# Patient Record
Sex: Female | Born: 1965 | Race: White | Hispanic: No | Marital: Single | Smoking: Current every day smoker
Health system: Southern US, Community
[De-identification: ages and names within clinical notes are randomized; demographics above are authoritative.]

## PROBLEM LIST (undated history)

## (undated) DIAGNOSIS — I10 Essential (primary) hypertension: Secondary | ICD-10-CM

## (undated) DIAGNOSIS — J449 Chronic obstructive pulmonary disease, unspecified: Secondary | ICD-10-CM

## (undated) DIAGNOSIS — F819 Developmental disorder of scholastic skills, unspecified: Secondary | ICD-10-CM

## (undated) DIAGNOSIS — I2510 Atherosclerotic heart disease of native coronary artery without angina pectoris: Secondary | ICD-10-CM

## (undated) DIAGNOSIS — G4733 Obstructive sleep apnea (adult) (pediatric): Secondary | ICD-10-CM

## (undated) DIAGNOSIS — I219 Acute myocardial infarction, unspecified: Secondary | ICD-10-CM

## (undated) DIAGNOSIS — R569 Unspecified convulsions: Secondary | ICD-10-CM

## (undated) DIAGNOSIS — M549 Dorsalgia, unspecified: Secondary | ICD-10-CM

## (undated) DIAGNOSIS — E119 Type 2 diabetes mellitus without complications: Secondary | ICD-10-CM

## (undated) DIAGNOSIS — I509 Heart failure, unspecified: Secondary | ICD-10-CM

## (undated) DIAGNOSIS — E669 Obesity, unspecified: Secondary | ICD-10-CM

## (undated) DIAGNOSIS — I69398 Other sequelae of cerebral infarction: Secondary | ICD-10-CM

## (undated) DIAGNOSIS — R42 Dizziness and giddiness: Secondary | ICD-10-CM

## (undated) DIAGNOSIS — C140 Malignant neoplasm of pharynx, unspecified: Secondary | ICD-10-CM

## (undated) DIAGNOSIS — C801 Malignant (primary) neoplasm, unspecified: Secondary | ICD-10-CM

## (undated) DIAGNOSIS — G43909 Migraine, unspecified, not intractable, without status migrainosus: Secondary | ICD-10-CM

## (undated) DIAGNOSIS — F1920 Other psychoactive substance dependence, uncomplicated: Secondary | ICD-10-CM

## (undated) DIAGNOSIS — F209 Schizophrenia, unspecified: Secondary | ICD-10-CM

## (undated) DIAGNOSIS — G4730 Sleep apnea, unspecified: Secondary | ICD-10-CM

## (undated) HISTORY — PX: APPENDECTOMY: SHX54

## (undated) HISTORY — PX: ABDOMINAL HYSTERECTOMY: SHX81

## (undated) HISTORY — PX: KNEE SURGERY: SHX244

## (undated) HISTORY — PX: EYE SURGERY: SHX253

## (undated) HISTORY — PX: CHOLECYSTECTOMY: SHX55

## (undated) HISTORY — DX: Obstructive sleep apnea (adult) (pediatric): G47.33

## (undated) HISTORY — DX: Essential (primary) hypertension: I10

## (undated) HISTORY — DX: Unspecified convulsions: R56.9

## (undated) HISTORY — DX: Type 2 diabetes mellitus without complications: E11.9

## (undated) HISTORY — PX: HYSTERECTOMY: SHX81

## (undated) HISTORY — PX: CATARACT EXTRACTION: SUR2

## (undated) HISTORY — DX: Acute myocardial infarction, unspecified: I21.9

## (undated) HISTORY — DX: Developmental disorder of scholastic skills, unspecified: F81.9

## (undated) HISTORY — DX: Heart failure, unspecified: I50.9

## (undated) HISTORY — DX: Dorsalgia, unspecified: M54.9

## (undated) HISTORY — DX: Chronic obstructive pulmonary disease, unspecified: J44.9

## (undated) HISTORY — PX: APPENDECTOMY (OPEN): SHX54

## (undated) HISTORY — DX: Atherosclerotic heart disease of native coronary artery without angina pectoris: I25.10

---

## 1998-07-05 ENCOUNTER — Emergency Department: Admit: 1998-07-05 | Disposition: A | Discharge status: Home / Self Care | Payer: Self-pay | Source: Ambulatory Visit

## 1998-07-16 ENCOUNTER — Emergency Department: Admit: 1998-07-16 | Disposition: A | Discharge status: Home / Self Care | Payer: Self-pay | Source: Ambulatory Visit

## 2012-04-28 ENCOUNTER — Encounter (HOSPITAL_COMMUNITY): Payer: Self-pay | Admitting: Emergency Medicine

## 2012-04-28 ENCOUNTER — Emergency Department (HOSPITAL_COMMUNITY)
Admission: EM | Admit: 2012-04-28 | Discharge: 2012-04-29 | Disposition: A | Discharge status: Home / Self Care | Payer: Medicaid Other | Attending: Emergency Medicine | Admitting: Emergency Medicine

## 2012-04-28 DIAGNOSIS — F419 Anxiety disorder, unspecified: Secondary | ICD-10-CM

## 2012-04-28 DIAGNOSIS — R51 Headache: Secondary | ICD-10-CM | POA: Insufficient documentation

## 2012-04-28 DIAGNOSIS — F17200 Nicotine dependence, unspecified, uncomplicated: Secondary | ICD-10-CM | POA: Insufficient documentation

## 2012-04-28 DIAGNOSIS — H532 Diplopia: Secondary | ICD-10-CM | POA: Insufficient documentation

## 2012-04-28 DIAGNOSIS — R739 Hyperglycemia, unspecified: Secondary | ICD-10-CM

## 2012-04-28 DIAGNOSIS — I10 Essential (primary) hypertension: Secondary | ICD-10-CM | POA: Insufficient documentation

## 2012-04-28 DIAGNOSIS — H538 Other visual disturbances: Secondary | ICD-10-CM | POA: Insufficient documentation

## 2012-04-28 DIAGNOSIS — E1169 Type 2 diabetes mellitus with other specified complication: Secondary | ICD-10-CM | POA: Insufficient documentation

## 2012-04-28 DIAGNOSIS — F209 Schizophrenia, unspecified: Secondary | ICD-10-CM | POA: Insufficient documentation

## 2012-04-28 DIAGNOSIS — Z794 Long term (current) use of insulin: Secondary | ICD-10-CM | POA: Insufficient documentation

## 2012-04-28 DIAGNOSIS — F411 Generalized anxiety disorder: Secondary | ICD-10-CM | POA: Insufficient documentation

## 2012-04-28 DIAGNOSIS — Z79899 Other long term (current) drug therapy: Secondary | ICD-10-CM | POA: Insufficient documentation

## 2012-04-28 HISTORY — DX: Essential (primary) hypertension: I10

## 2012-04-28 LAB — GLUCOSE, CAPILLARY: Glucose-Capillary: 173 mg/dL — ABNORMAL HIGH (ref 70–99)

## 2012-04-28 LAB — VALPROIC ACID LEVEL: Valproic Acid Lvl: <10 ug/mL — ABNORMAL LOW (ref 50.0–100.0)

## 2012-04-28 MED ORDER — DIVALPROEX SODIUM 250 MG PO DR TAB
500.0000 mg | DELAYED_RELEASE_TABLET | Freq: Once | ORAL | Status: AC
  Administered 2012-04-28: 500 mg via ORAL
  Filled 2012-04-28: qty 2

## 2012-04-28 MED ORDER — LORAZEPAM 2 MG/ML IJ SOLN
1.0000 mg | Freq: Once | INTRAMUSCULAR | Status: AC
  Administered 2012-04-28: 1 mg via INTRAMUSCULAR
  Filled 2012-04-28: qty 1

## 2012-04-28 MED ORDER — MORPHINE SULFATE 4 MG/ML IJ SOLN
2.0000 mg | Freq: Once | INTRAMUSCULAR | Status: AC
  Administered 2012-04-28: 2 mg via INTRAMUSCULAR
  Filled 2012-04-28: qty 1
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
## 2012-04-28 NOTE — ED Provider Notes (Signed)
Medical screening examination/treatment/procedure(s) were performed by non-physician practitioner and as supervising physician I was immediately available for consultation/collaboration.  Sircharles Holzheimer R. Jennife Zaucha, MD 04/28/12 2346 
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
## 2012-04-29 NOTE — ED Notes (Signed)
Taxi voucher given to pt for transport back to shelter.

## 2012-04-29 NOTE — ED Notes (Signed)
Pt from emergency shelter and assisting pt with transportation back to shelter. Charge updated re: need for transport.

## 2012-05-03 ENCOUNTER — Emergency Department (HOSPITAL_COMMUNITY): Payer: Medicaid Other

## 2012-05-03 ENCOUNTER — Encounter (HOSPITAL_COMMUNITY): Payer: Self-pay | Admitting: Emergency Medicine

## 2012-05-03 ENCOUNTER — Observation Stay (HOSPITAL_COMMUNITY)
Admission: EM | Admit: 2012-05-03 | Discharge: 2012-05-04 | Discharge status: Against Medical Advice | Payer: Medicaid Other | Attending: Emergency Medicine | Admitting: Emergency Medicine

## 2012-05-03 DIAGNOSIS — E119 Type 2 diabetes mellitus without complications: Secondary | ICD-10-CM | POA: Diagnosis present

## 2012-05-03 DIAGNOSIS — J4489 Other specified chronic obstructive pulmonary disease: Secondary | ICD-10-CM | POA: Insufficient documentation

## 2012-05-03 DIAGNOSIS — I252 Old myocardial infarction: Secondary | ICD-10-CM | POA: Insufficient documentation

## 2012-05-03 DIAGNOSIS — R079 Chest pain, unspecified: Principal | ICD-10-CM | POA: Diagnosis present

## 2012-05-03 DIAGNOSIS — J449 Chronic obstructive pulmonary disease, unspecified: Secondary | ICD-10-CM | POA: Diagnosis present

## 2012-05-03 DIAGNOSIS — R0602 Shortness of breath: Secondary | ICD-10-CM | POA: Insufficient documentation

## 2012-05-03 DIAGNOSIS — F319 Bipolar disorder, unspecified: Secondary | ICD-10-CM | POA: Diagnosis present

## 2012-05-03 DIAGNOSIS — I10 Essential (primary) hypertension: Secondary | ICD-10-CM | POA: Insufficient documentation

## 2012-05-03 HISTORY — DX: Type 2 diabetes mellitus without complications: E11.9

## 2012-05-03 HISTORY — DX: Chronic obstructive pulmonary disease, unspecified: J44.9

## 2012-05-03 HISTORY — DX: Malignant (primary) neoplasm, unspecified: C80.1

## 2012-05-03 HISTORY — DX: Unspecified convulsions: R56.9

## 2012-05-03 LAB — CBC
HCT: 38.3 % (ref 36.0–46.0)
MCHC: 33.4 g/dL (ref 30.0–36.0)
MCV: 90.8 fL (ref 78.0–100.0)
Platelets: 181 10*3/uL (ref 150–400)
RDW: 12.9 % (ref 11.5–15.5)
WBC: 8.8 10*3/uL (ref 4.0–10.5)

## 2012-05-03 LAB — POCT I-STAT, CHEM 8
Calcium, Ion: 1.2 mmol/L (ref 1.12–1.23)
Chloride: 101 mEq/L (ref 96–112)
Glucose, Bld: 191 mg/dL — ABNORMAL HIGH (ref 70–99)
HCT: 39 % (ref 36.0–46.0)
Hemoglobin: 13.3 g/dL (ref 12.0–15.0)
TCO2: 29 mmol/L (ref 0–100)

## 2012-05-03 LAB — POCT I-STAT TROPONIN I: Troponin i, poc: 0 ng/mL (ref 0.00–0.08)

## 2012-05-03 MED ORDER — ONDANSETRON HCL 4 MG/2ML IJ SOLN
4.0000 mg | Freq: Once | INTRAMUSCULAR | Status: AC
  Administered 2012-05-03: 4 mg via INTRAVENOUS
  Filled 2012-05-03: qty 2

## 2012-05-03 MED ORDER — NITROGLYCERIN 0.4 MG SL SUBL: 0.4000 mg | SUBLINGUAL_TABLET | SUBLINGUAL | Status: DC | PRN

## 2012-05-03 MED ORDER — MORPHINE SULFATE 4 MG/ML IJ SOLN
4.0000 mg | Freq: Once | INTRAMUSCULAR | Status: AC
  Administered 2012-05-03: 4 mg via INTRAVENOUS
  Filled 2012-05-03: qty 1

## 2012-05-03 NOTE — ED Notes (Signed)
Meds given PTA were Aspirin 324mg . And Nitro 0.4 SL X 1 per Pt. And 1 per EMS.

## 2012-05-03 NOTE — ED Provider Notes (Addendum)
History     CSN: [id number]  Arrival date & time 05/03/12  2256   First MD Initiated Contact with Patient 05/03/12 2311      Chief Complaint  Patient presents with  . Chest Pain    (Consider location/radiation/quality/duration/timing/severity/associated sxs/prior treatment) HPI Hx per PT. L sided CP, pressure radiates to R arm, some associated SOB, onset earlier tonight, called EMS, pain decreased from 9/10 to 4/10 with 2 NTG. Had 4 baby ASA. States h/o MI 4 years ago. No cardiologist, is from [facility]. No known aggrevating factors. H/o COPD - no wheezing. H/o crack use denies any in the last few weeks. No diaphoresis or nausea Past Medical History  Diagnosis Date  . Hypertension   . Seizures   . COPD (chronic obstructive pulmonary disease)   . Diabetes mellitus without complication   . Cancer     No past surgical history on file.  No family history on file.  History  Substance Use Topics  . Smoking status: Never Smoker   . Smokeless tobacco: Not on file  . Alcohol Use: No    OB History   Grav Para Term Preterm Abortions TAB SAB Ect Mult Living                  Review of Systems  Constitutional: Negative for fever and chills.  HENT: Negative for neck pain and neck stiffness.   Eyes: Negative for pain.  Respiratory: Negative for shortness of breath.   Cardiovascular: Positive for chest pain.  Gastrointestinal: Negative for abdominal pain.  Genitourinary: Negative for dysuria.  Musculoskeletal: Negative for back pain.  Skin: Negative for rash.  Neurological: Negative for headaches.  All other systems reviewed and are negative.    Allergies  Bee venom; Aspirin; Other; and Penicillins  Home Medications   Current Outpatient Rx  Name  Route  Sig  Dispense  Refill  . albuterol (PROVENTIL HFA;VENTOLIN HFA) 108 (90 BASE) MCG/ACT inhaler   Inhalation   Inhale 2 puffs into the lungs 2 (two) times daily as needed for wheezing or shortness of breath.          . diazepam (VALIUM) 5 MG tablet   Oral   Take 5 mg by mouth every 6 (six) hours as needed. For anxiety and depression         . divalproex (DEPAKOTE) 500 MG DR tablet   Oral   Take 500 mg by mouth 2 (two) times daily.         . enalapril (VASOTEC) 5 MG tablet   Oral   Take 5 mg by mouth daily.         . Insulin Glargine (LANTUS SOLOSTAR [place])   Subcutaneous   Inject 20 Units into the skin 2 (two) times daily.         . nitroGLYCERIN (NITROSTAT) 0.4 MG SL tablet   Sublingual   Place 0.4 mg under the tongue every 5 (five) minutes as needed for chest pain.         . Saxagliptin-Metformin (KOMBIGLYZE XR) 07-998 MG TB24   Oral   Take 1 tablet by mouth daily.         Marland Kitchen thiothixene (NAVANE) 5 MG capsule   Oral   Take 10 mg by mouth daily.         . traZODone (DESYREL) 150 MG tablet   Oral   Take 150 mg by mouth at bedtime.           BP  91/73  Pulse 81  Temp(Src) 97.7 F (36.5 C) (Oral)  Resp 19  SpO2 93%  Physical Exam  Constitutional: She is oriented to person, place, and time. She appears well-developed and well-nourished.  HENT:  Head: Normocephalic and atraumatic.  Eyes: Conjunctivae and EOM are normal. Pupils are equal, round, and reactive to light.  Neck: Trachea normal. Neck supple. No thyromegaly present.  Cardiovascular: Normal rate, regular rhythm, S1 normal, S2 normal and normal pulses.     No systolic murmur is present   No diastolic murmur is present  Pulses:      Radial pulses are 2+ on the right side, and 2+ on the left side.  Pulmonary/Chest: Effort normal and breath sounds normal. She has no wheezes. She has no rhonchi. She has no rales. She exhibits no tenderness.  Abdominal: Soft. Normal appearance and bowel sounds are normal. There is no tenderness. There is no CVA tenderness and negative Murphy's sign.  Musculoskeletal:  BLE:s Calves nontender, no cords or erythema, negative Homans sign  Neurological: She is alert and  oriented to person, place, and time. She has normal strength. No cranial nerve deficit or sensory deficit. GCS eye subscore is 4. GCS verbal subscore is 5. GCS motor subscore is 6.  Skin: Skin is warm and dry. No rash noted. She is not diaphoretic.  Psychiatric: Her speech is normal.  Cooperative and appropriate    ED Course  Procedures (including critical care time)  Results for orders placed during the hospital encounter of 05/03/12  CBC      Result Value Range   WBC 8.8  4.0 - 10.5 K/uL   RBC 4.22  3.87 - 5.11 MIL/uL   Hemoglobin 12.8  12.0 - 15.0 g/dL   HCT 16.1  09.6 - 04.5 %   MCV 90.8  78.0 - 100.0 fL   MCH 30.3  26.0 - 34.0 pg   MCHC 33.4  30.0 - 36.0 g/dL   RDW 40.9  81.1 - 91.4 %   Platelets 181  150 - 400 K/uL  POCT I-STAT, CHEM 8      Result Value Range   Sodium 138  135 - 145 mEq/L   Potassium 4.0  3.5 - 5.1 mEq/L   Chloride 101  96 - 112 mEq/L   BUN 11  6 - 23 mg/dL   Creatinine, Ser 7.82  0.50 - 1.10 mg/dL   Glucose, Bld 956 (*) 70 - 99 mg/dL   Calcium, Ion 2.13  1.12 - 1.23 mmol/L   TCO2 29  0 - 100 mmol/L   Hemoglobin 13.3  12.0 - 15.0 g/dL   HCT 08.6  57.8 - 46.9 %  POCT I-STAT TROPONIN I      Result Value Range   Troponin i, poc 0.00  0.00 - 0.08 ng/mL   Comment 3            Dg Chest Portable 1 View  05/03/2012  *RADIOLOGY REPORT*  Clinical Data: Chest pain and pressure; history of smoking and cocaine use.  PORTABLE CHEST - 1 VIEW  Comparison: Chest radiograph of 01/22/2012  Findings: The lungs are well-aerated.  Pulmonary vascularity is at the upper limits of normal.  There is no evidence of focal opacification, pleural effusion or pneumothorax.  The cardiomediastinal silhouette is borderline normal in size.  No acute osseous abnormalities are seen.  IMPRESSION: No acute cardiopulmonary process seen.   Original Report Authenticated By: Tonia Ghent, M.D.     IV morphine. NTG provided  ASA PTA   Date: 05/03/2012  Rate: 82  Rhythm: normal sinus  rhythm  QRS Axis: normal  Intervals: normal  ST/T Wave abnormalities: nonspecific ST changes  Conduction Disutrbances:none  Narrative Interpretation:   Old EKG Reviewed: none available  12:18 AM d/w unassigned MED on call, plan admit triad hospitalist DR Eben Burow   MDM  CP with h/o MI in 47 yo diabetic with COPD  CXR, ECG, labs  ASA PTA, NTG, morphine  MED admit        Sunnie Nielsen, MD 05/04/12 0020       1:10 AM PT now states she does not want to be admitted, she has things she has to do to tomorrow.   She agrees to one more troponin test and ECG.  She will leave AMA. Dr Eben Burow spoke with PT briefly and canceled admit.     Date: 05/04/2012  Rate: 63  Rhythm: normal sinus rhythm  QRS Axis: normal  Intervals: normal  ST/T Wave abnormalities: nonspecific ST changes  Conduction Disutrbances:none  Narrative Interpretation:   Old EKG Reviewed: unchanged   Repeat troponin negative. Repeat ECG unchanged  Results for orders placed during the hospital encounter of 05/03/12  CBC      Result Value Range   WBC 8.8  4.0 - 10.5 K/uL   RBC 4.22  3.87 - 5.11 MIL/uL   Hemoglobin 12.8  12.0 - 15.0 g/dL   HCT 16.1  09.6 - 04.5 %   MCV 90.8  78.0 - 100.0 fL   MCH 30.3  26.0 - 34.0 pg   MCHC 33.4  30.0 - 36.0 g/dL   RDW 40.9  81.1 - 91.4 %   Platelets 181  150 - 400 K/uL  POCT I-STAT, CHEM 8      Result Value Range   Sodium 138  135 - 145 mEq/L   Potassium 4.0  3.5 - 5.1 mEq/L   Chloride 101  96 - 112 mEq/L   BUN 11  6 - 23 mg/dL   Creatinine, Ser 7.82  0.50 - 1.10 mg/dL   Glucose, Bld 956 (*) 70 - 99 mg/dL   Calcium, Ion 2.13  1.12 - 1.23 mmol/L   TCO2 29  0 - 100 mmol/L   Hemoglobin 13.3  12.0 - 15.0 g/dL   HCT 08.6  57.8 - 46.9 %  POCT I-STAT TROPONIN I      Result Value Range   Troponin i, poc 0.00  0.00 - 0.08 ng/mL   Comment 3           POCT I-STAT TROPONIN I      Result Value Range   Troponin i, poc 0.00  0.00 - 0.08 ng/mL   Comment 3                Sunnie Nielsen, MD 05/04/12 [id number]

## 2012-05-03 NOTE — ED Notes (Signed)
EMS called out to group home tonight for Pt's C/O left sided chest pain with no radiation. Stated it started around 1400 today. Had mild MI 4 yrs. Ago was advised to have a heart cath then but she refused.

## 2012-05-03 NOTE — ED Notes (Signed)
Blood Glucose per EMS= 159

## 2012-05-04 ENCOUNTER — Encounter (HOSPITAL_COMMUNITY): Payer: Self-pay | Admitting: Vascular Surgery

## 2012-05-04 ENCOUNTER — Emergency Department (HOSPITAL_COMMUNITY)
Admission: EM | Admit: 2012-05-04 | Discharge: 2012-05-04 | Disposition: A | Discharge status: Home / Self Care | Payer: Medicaid Other | Attending: Emergency Medicine | Admitting: Emergency Medicine

## 2012-05-04 ENCOUNTER — Emergency Department (HOSPITAL_COMMUNITY): Payer: Medicaid Other

## 2012-05-04 ENCOUNTER — Encounter (HOSPITAL_COMMUNITY): Payer: Self-pay | Admitting: Emergency Medicine

## 2012-05-04 DIAGNOSIS — F209 Schizophrenia, unspecified: Secondary | ICD-10-CM | POA: Insufficient documentation

## 2012-05-04 DIAGNOSIS — M79609 Pain in unspecified limb: Secondary | ICD-10-CM | POA: Insufficient documentation

## 2012-05-04 DIAGNOSIS — G40909 Epilepsy, unspecified, not intractable, without status epilepticus: Secondary | ICD-10-CM | POA: Insufficient documentation

## 2012-05-04 DIAGNOSIS — R079 Chest pain, unspecified: Secondary | ICD-10-CM | POA: Diagnosis present

## 2012-05-04 DIAGNOSIS — G4730 Sleep apnea, unspecified: Secondary | ICD-10-CM | POA: Insufficient documentation

## 2012-05-04 DIAGNOSIS — Z79899 Other long term (current) drug therapy: Secondary | ICD-10-CM | POA: Insufficient documentation

## 2012-05-04 DIAGNOSIS — F17200 Nicotine dependence, unspecified, uncomplicated: Secondary | ICD-10-CM | POA: Insufficient documentation

## 2012-05-04 DIAGNOSIS — I10 Essential (primary) hypertension: Secondary | ICD-10-CM | POA: Insufficient documentation

## 2012-05-04 DIAGNOSIS — J4489 Other specified chronic obstructive pulmonary disease: Secondary | ICD-10-CM | POA: Insufficient documentation

## 2012-05-04 DIAGNOSIS — I2510 Atherosclerotic heart disease of native coronary artery without angina pectoris: Secondary | ICD-10-CM | POA: Insufficient documentation

## 2012-05-04 DIAGNOSIS — M79601 Pain in right arm: Secondary | ICD-10-CM

## 2012-05-04 DIAGNOSIS — E119 Type 2 diabetes mellitus without complications: Secondary | ICD-10-CM | POA: Diagnosis present

## 2012-05-04 DIAGNOSIS — Z794 Long term (current) use of insulin: Secondary | ICD-10-CM | POA: Insufficient documentation

## 2012-05-04 DIAGNOSIS — F411 Generalized anxiety disorder: Secondary | ICD-10-CM | POA: Insufficient documentation

## 2012-05-04 DIAGNOSIS — Z859 Personal history of malignant neoplasm, unspecified: Secondary | ICD-10-CM | POA: Insufficient documentation

## 2012-05-04 DIAGNOSIS — J449 Chronic obstructive pulmonary disease, unspecified: Secondary | ICD-10-CM | POA: Insufficient documentation

## 2012-05-04 DIAGNOSIS — F319 Bipolar disorder, unspecified: Secondary | ICD-10-CM | POA: Diagnosis present

## 2012-05-04 HISTORY — DX: Sleep apnea, unspecified: G47.30

## 2012-05-04 HISTORY — DX: Schizophrenia, unspecified: F20.9

## 2012-05-04 HISTORY — DX: Atherosclerotic heart disease of native coronary artery without angina pectoris: I25.10

## 2012-05-04 LAB — CBC WITH DIFFERENTIAL/PLATELET
Basophils Absolute: 0 10*3/uL (ref 0.0–0.1)
Basophils Relative: 0 % (ref 0–1)
Eosinophils Absolute: 0.1 10*3/uL (ref 0.0–0.7)
HCT: 38.4 % (ref 36.0–46.0)
MCH: 31 pg (ref 26.0–34.0)
MCHC: 33.6 g/dL (ref 30.0–36.0)
Monocytes Absolute: 0.6 10*3/uL (ref 0.1–1.0)
Monocytes Relative: 9 % (ref 3–12)
Neutro Abs: 3.5 10*3/uL (ref 1.7–7.7)
Neutrophils Relative %: 49 % (ref 43–77)
RDW: 13.2 % (ref 11.5–15.5)

## 2012-05-04 LAB — URINALYSIS, ROUTINE W REFLEX MICROSCOPIC
Bilirubin Urine: NEGATIVE
Hgb urine dipstick: NEGATIVE
Nitrite: NEGATIVE
Protein, ur: NEGATIVE mg/dL
Specific Gravity, Urine: 1.022 (ref 1.005–1.030)
Urobilinogen, UA: 0.2 mg/dL (ref 0.0–1.0)

## 2012-05-04 LAB — D-DIMER, QUANTITATIVE: D-Dimer, Quant: 0.77 ug/mL-FEU — ABNORMAL HIGH (ref 0.00–0.48)

## 2012-05-04 LAB — RAPID URINE DRUG SCREEN, HOSP PERFORMED
Amphetamines: NOT DETECTED
Barbiturates: NOT DETECTED
Benzodiazepines: POSITIVE — AB
Cocaine: NOT DETECTED
Opiates: POSITIVE — AB
Tetrahydrocannabinol: NOT DETECTED

## 2012-05-04 LAB — PROTIME-INR: INR: 0.87 (ref 0.00–1.49)

## 2012-05-04 LAB — POCT I-STAT TROPONIN I
Troponin i, poc: 0 ng/mL (ref 0.00–0.08)
Troponin i, poc: 0 ng/mL (ref 0.00–0.08)

## 2012-05-04 MED ORDER — SODIUM CHLORIDE 0.9 % IV SOLN
INTRAVENOUS | Status: DC
  Administered 2012-05-04: 17:00:00 via INTRAVENOUS

## 2012-05-04 MED ORDER — ONDANSETRON HCL 4 MG/2ML IJ SOLN
4.0000 mg | Freq: Once | INTRAMUSCULAR | Status: AC
  Administered 2012-05-04: 4 mg via INTRAVENOUS
  Filled 2012-05-04: qty 2

## 2012-05-04 MED ORDER — MORPHINE SULFATE 4 MG/ML IJ SOLN
4.0000 mg | Freq: Once | INTRAMUSCULAR | Status: AC
  Administered 2012-05-04: 4 mg via INTRAVENOUS
  Filled 2012-05-04: qty 1

## 2012-05-04 MED ORDER — LORAZEPAM 2 MG/ML IJ SOLN
1.0000 mg | Freq: Once | INTRAMUSCULAR | Status: AC
  Administered 2012-05-04: 1 mg via INTRAVENOUS
  Filled 2012-05-04: qty 1

## 2012-05-04 MED ORDER — KETOROLAC TROMETHAMINE 15 MG/ML IJ SOLN
15.0000 mg | Freq: Once | INTRAMUSCULAR | Status: DC
  Filled 2012-05-04 (×2): qty 1

## 2012-05-04 MED ORDER — KETOROLAC TROMETHAMINE 30 MG/ML IJ SOLN
INTRAMUSCULAR | Status: AC
  Filled 2012-05-04: qty 1

## 2012-05-04 NOTE — ED Notes (Addendum)
Pt seen here last night for right arm pain; pt given morphine with pain relief; pt c/o right arm pain; 80/50; pt left AMA last night;

## 2012-05-04 NOTE — ED Notes (Addendum)
CT called and informed that pt would have to have a 20 g IV. Went in to stick pt for CT angio IV requirement and pt adamantly refused to be stuck. Pt also reports that she wants another dose of pain medicine and then to go home.
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
## 2012-05-04 NOTE — ED Notes (Signed)
[facility] here for pt; 

## 2012-05-04 NOTE — ED Notes (Signed)
Carb modified diet ordered for pt 

## 2012-05-05 ENCOUNTER — Telehealth (HOSPITAL_COMMUNITY): Payer: Self-pay | Admitting: *Deleted

## 2012-05-08 ENCOUNTER — Emergency Department (HOSPITAL_COMMUNITY): Payer: Medicaid Other

## 2012-05-08 ENCOUNTER — Emergency Department (HOSPITAL_COMMUNITY)
Admission: EM | Admit: 2012-05-08 | Discharge: 2012-05-09 | Disposition: A | Discharge status: Home / Self Care | Payer: Medicaid Other | Attending: Emergency Medicine | Admitting: Emergency Medicine

## 2012-05-08 ENCOUNTER — Encounter (HOSPITAL_COMMUNITY): Payer: Self-pay | Admitting: *Deleted

## 2012-05-08 DIAGNOSIS — F209 Schizophrenia, unspecified: Secondary | ICD-10-CM | POA: Insufficient documentation

## 2012-05-08 DIAGNOSIS — R079 Chest pain, unspecified: Secondary | ICD-10-CM

## 2012-05-08 DIAGNOSIS — J449 Chronic obstructive pulmonary disease, unspecified: Secondary | ICD-10-CM | POA: Insufficient documentation

## 2012-05-08 DIAGNOSIS — Z794 Long term (current) use of insulin: Secondary | ICD-10-CM | POA: Insufficient documentation

## 2012-05-08 DIAGNOSIS — Z79899 Other long term (current) drug therapy: Secondary | ICD-10-CM | POA: Insufficient documentation

## 2012-05-08 DIAGNOSIS — Z85819 Personal history of malignant neoplasm of unspecified site of lip, oral cavity, and pharynx: Secondary | ICD-10-CM | POA: Insufficient documentation

## 2012-05-08 DIAGNOSIS — J4489 Other specified chronic obstructive pulmonary disease: Secondary | ICD-10-CM | POA: Insufficient documentation

## 2012-05-08 DIAGNOSIS — F17200 Nicotine dependence, unspecified, uncomplicated: Secondary | ICD-10-CM | POA: Insufficient documentation

## 2012-05-08 DIAGNOSIS — I2510 Atherosclerotic heart disease of native coronary artery without angina pectoris: Secondary | ICD-10-CM | POA: Insufficient documentation

## 2012-05-08 DIAGNOSIS — G40909 Epilepsy, unspecified, not intractable, without status epilepticus: Secondary | ICD-10-CM | POA: Insufficient documentation

## 2012-05-08 DIAGNOSIS — G4730 Sleep apnea, unspecified: Secondary | ICD-10-CM | POA: Insufficient documentation

## 2012-05-08 DIAGNOSIS — R071 Chest pain on breathing: Secondary | ICD-10-CM | POA: Insufficient documentation

## 2012-05-08 DIAGNOSIS — E119 Type 2 diabetes mellitus without complications: Secondary | ICD-10-CM | POA: Insufficient documentation

## 2012-05-08 DIAGNOSIS — I10 Essential (primary) hypertension: Secondary | ICD-10-CM | POA: Insufficient documentation

## 2012-05-08 LAB — CBC WITH DIFFERENTIAL/PLATELET
Basophils Absolute: 0 10*3/uL (ref 0.0–0.1)
Basophils Relative: 0 % (ref 0–1)
Eosinophils Relative: 1 % (ref 0–5)
HCT: 39.9 % (ref 36.0–46.0)
Lymphocytes Relative: 33 % (ref 12–46)
MCHC: 34.3 g/dL (ref 30.0–36.0)
Monocytes Absolute: 1.1 10*3/uL — ABNORMAL HIGH (ref 0.1–1.0)
Neutro Abs: 7.2 10*3/uL (ref 1.7–7.7)
Platelets: 187 10*3/uL (ref 150–400)
RDW: 12.7 % (ref 11.5–15.5)
WBC: 12.7 10*3/uL — ABNORMAL HIGH (ref 4.0–10.5)

## 2012-05-08 LAB — BASIC METABOLIC PANEL
Calcium: 9.8 mg/dL (ref 8.4–10.5)
Chloride: 98 mEq/L (ref 96–112)
Creatinine, Ser: 0.59 mg/dL (ref 0.50–1.10)
GFR calc Af Amer: >90 mL/min (ref >90)
Sodium: 135 mEq/L (ref 135–145)

## 2012-05-08 LAB — TROPONIN I: Troponin I: <0.3 ng/mL (ref <0.30)

## 2012-05-08 LAB — POCT PREGNANCY, URINE: Preg Test, Ur: NEGATIVE

## 2012-05-08 MED ORDER — ONDANSETRON HCL 4 MG/2ML IJ SOLN
4.0000 mg | Freq: Once | INTRAMUSCULAR | Status: AC
  Administered 2012-05-08: 4 mg via INTRAVENOUS
  Filled 2012-05-08: qty 2

## 2012-05-08 MED ORDER — HYDROMORPHONE HCL PF 1 MG/ML IJ SOLN
1.0000 mg | Freq: Once | INTRAMUSCULAR | Status: AC
  Administered 2012-05-08: 1 mg via INTRAVENOUS
  Filled 2012-05-08: qty 1

## 2012-05-08 MED ORDER — IOHEXOL 350 MG/ML SOLN
100.0000 mL | Freq: Once | INTRAVENOUS | Status: AC | PRN
  Administered 2012-05-08: 100 mL via INTRAVENOUS

## 2012-05-08 NOTE — ED Notes (Addendum)
Patient transported to CT 

## 2012-05-08 NOTE — ED Notes (Signed)
Pt seen here for same four days ago. Pt refused the CT angio on prior visit

## 2012-05-08 NOTE — ED Notes (Signed)
CT informed pt is ready for transport. 

## 2012-05-08 NOTE — ED Notes (Signed)
Per EMS: pt coming from homeless shelter with c/o mid chest pain, worse on palpation, pt reports nausea, and diaphoresis, denies shortness of breath. Pt given 81 mg asa x4, nitro x1 with no relief. 12 lead was NS with no changes. Pt is A&Ox4, skin warm and dry, respirations equal and unlabored.

## 2012-05-09 LAB — POCT I-STAT, CHEM 8
BUN: 15 mg/dL (ref 6–23)
Chloride: 103 mEq/L (ref 96–112)
Creatinine, Ser: 0.9 mg/dL (ref 0.50–1.10)
Glucose, Bld: 204 mg/dL — ABNORMAL HIGH (ref 70–99)
Hemoglobin: 12.9 g/dL (ref 12.0–15.0)
Potassium: 6.8 mEq/L (ref 3.5–5.1)
Sodium: 135 mEq/L (ref 135–145)

## 2012-05-09 MED ORDER — FENTANYL CITRATE 0.05 MG/ML IJ SOLN
50.0000 ug | Freq: Once | INTRAMUSCULAR | Status: AC
  Administered 2012-05-09: 50 ug via INTRAVENOUS
  Filled 2012-05-09: qty 2

## 2012-05-09 MED ORDER — HYDROMORPHONE HCL PF 1 MG/ML IJ SOLN
1.0000 mg | Freq: Once | INTRAMUSCULAR | Status: DC
  Filled 2012-05-09: qty 1

## 2012-05-09 NOTE — ED Provider Notes (Signed)
History     CSN: [id number]  Arrival date & time 05/08/12  1928   First MD Initiated Contact with Patient 05/08/12 2041      Chief Complaint  Patient presents with  . Chest Pain    HPI Carol Hughes is a 47 y.o. female who presents to the ED for concern of R sided CP and blood clot.  Was seen here 5 days ago with these same symptoms and had positive d-dimer but left AMA.  Now presents as over last 5 hours patient with same pain.  R sided, sharp, pleuritic, moderate in severity, worse with breathing, coughing, laughing, better with nothing.  No fevers.  No leg swelling.  No other symptoms.  Past Medical History  Diagnosis Date  . Hypertension   . Seizures   . COPD (chronic obstructive pulmonary disease)   . Diabetes mellitus without complication   . Cancer throat  . Sleep apnea   . Coronary artery disease   . Schizophrenia     Past Surgical History  Procedure Laterality Date  . Abdominal hysterectomy    . Appendectomy    . Cholecystectomy    . Eye surgery    . Cesarean section    . Knee surgery      History reviewed. No pertinent family history.  History  Substance Use Topics  . Smoking status: Current Every Day Smoker -- 0.50 packs/day  . Smokeless tobacco: Not on file  . Alcohol Use: No    OB History   Grav Para Term Preterm Abortions TAB SAB Ect Mult Living                  Review of Systems  Constitutional: Negative for fever and chills.  HENT: Negative for congestion, rhinorrhea, neck pain and neck stiffness.   Respiratory: Negative for cough and shortness of breath.   Cardiovascular: Positive for chest pain.  Gastrointestinal: Negative for nausea, vomiting, abdominal pain, diarrhea and abdominal distention.  Endocrine: Negative for polyuria.  Genitourinary: Negative for dysuria.  Skin: Negative for rash.  Neurological: Negative for headaches.  Psychiatric/Behavioral: Negative.   All other systems reviewed and are negative.    Allergies  Bee  venom; Aspirin; Other; and Penicillins  Home Medications   Current Outpatient Rx  Name  Route  Sig  Dispense  Refill  . divalproex (DEPAKOTE) 500 MG DR tablet   Oral   Take 500 mg by mouth 2 (two) times daily.         . enalapril (VASOTEC) 5 MG tablet   Oral   Take 5 mg by mouth daily.         . Insulin Glargine (LANTUS SOLOSTAR [place])   Subcutaneous   Inject 20 Units into the skin 2 (two) times daily.         . Saxagliptin-Metformin (KOMBIGLYZE XR) 07-998 MG TB24   Oral   Take 1 tablet by mouth daily.         Marland Kitchen thiothixene (NAVANE) 5 MG capsule   Oral   Take 10 mg by mouth daily.         . traZODone (DESYREL) 150 MG tablet   Oral   Take 150 mg by mouth at bedtime.         . nitroGLYCERIN (NITROSTAT) 0.4 MG SL tablet   Sublingual   Place 0.4 mg under the tongue every 5 (five) minutes as needed for chest pain.           BP 100/55  Pulse 71  Temp(Src) 98.1 F (36.7 C) (Oral)  Resp 16  SpO2 97%  Physical Exam  Nursing note and vitals reviewed. Constitutional: She is oriented to person, place, and time. She appears well-developed and well-nourished. No distress.  HENT:  Head: Normocephalic and atraumatic.  Right Ear: External ear normal.  Left Ear: External ear normal.  Nose: Nose normal.  Mouth/Throat: Oropharynx is clear and moist. No oropharyngeal exudate.  Eyes: EOM are normal. Pupils are equal, round, and reactive to light.  Neck: Normal range of motion. Neck supple. No tracheal deviation present.  Cardiovascular: Normal rate.   Pulmonary/Chest: Effort normal and breath sounds normal. No stridor. No respiratory distress. She has no wheezes. She has no rales.  Abdominal: Soft. She exhibits no distension. There is no tenderness. There is no rebound.  Musculoskeletal: Normal range of motion.  Neurological: She is alert and oriented to person, place, and time.  Skin: Skin is warm and dry. She is not diaphoretic.    ED Course  Procedures  (including critical care time) Procedure: EJ IV access Resident: Arloa Koh, MD Attending: Dr. Oletta Lamas Note: Verbal consent obtained.  Patient laid in trendelenburg position.  R EJ with best visibility.  Prepped with alcohol pad.  Patients head turned to L.  Needle inserted with flashback on first try.  Catheter advanced in to vein.  Catheter secured.  Attending present for entire procedure.  Patient tolerated procedure well without complications.    Labs Reviewed  CBC WITH DIFFERENTIAL - Abnormal; Notable for the following:    WBC 12.7 (*)    Lymphs Abs 4.2 (*)    Monocytes Absolute 1.1 (*)    All other components within normal limits  BASIC METABOLIC PANEL - Abnormal; Notable for the following:    Glucose, Bld 216 (*)    All other components within normal limits  TROPONIN I  POCT PREGNANCY, URINE   Ct Angio Chest Pe W/cm &/or Wo Cm  05/09/2012  *RADIOLOGY REPORT*  Clinical Data: Chest pain, history of throat cancer evaluate for PE  CT ANGIOGRAPHY CHEST  Technique:  Multidetector CT imaging of the chest using the standard protocol during bolus administration of intravenous contrast. Multiplanar reconstructed images including MIPs were obtained and reviewed to evaluate the vascular anatomy.  Contrast: OMNIPAQUE IOHEXOL 350 MG/ML SOLN  Comparison: Most recent prior chest x-ray 05/03/2012; prior CT chest 03/20/2008  Findings:  Mediastinum: No suspicious mediastinal or hilar adenopathy. Unremarkable thoracic esophagus.  Heart/Vascular: Adequate opacification of the pulmonary artery to the segmental level.  Mildly limited evaluation of the small vessels secondary to respiratory motion.  No evidence of central filling defect to the level of the segmental arteries to suggest acute pulmonary embolus.  Heart is top normal for size with prominence of the left ventricle.  No pericardial effusion. Limited evaluation for coronary artery disease secondary to respiratory motion.  Trace atherosclerotic  calcifications noted in the right brachiocephalic artery and descending thoracic aorta.  Lungs/Pleura: Scattered areas of air trapping bilaterally throughout the lungs.  Dependent atelectasis in the lower lobes single pulmonary cyst versus bulla in the left lower lobe.  Upper Abdomen: The imaged upper abdomen is unremarkable.  Bones: No acute fracture or aggressive appearing lytic or blastic osseous lesion.  IMPRESSION:  1.  Negative for acute pulmonary embolus, pneumonia or other acute cardiopulmonary process. 2.  Scattered areas of air trapping bilaterally suggests underlying small vessels and / or small airways disease. 3.  Borderline cardiomegaly with prominence of the left  ventricle. 4.  Trace atherosclerotic vascular disease   Original Report Authenticated By: Malachy Moan, M.D.      1. Chest pain      Date: 05/09/2012  Rate: 89  Rhythm: normal sinus rhythm  QRS Axis: normal  Intervals: normal  ST/T Wave abnormalities: normal  Conduction Disutrbances:none  Narrative Interpretation: Normal EKG  Old EKG Reviewed: unchanged    MDM   Anniyah Lennartz is a 47 y.o. female who presents to the ED for concern of R sided pleuritic CP.  Patient reports that this is nothing like her anginal equivalent.  Concern for PE earlier this week.  Pain is pleuritic.  CT PE study done and negative.  Patient feeling better.  Likely MSK as reproducible on exam.  Patient safe for discharge.  Patient discharged.     Arloa Koh, MD 05/09/12 0201

## 2012-05-09 NOTE — ED Notes (Signed)
Pt alert, NAD, calm, interactive. Admits to dizziness (when asked). Denies blurred vision. BP cuff checked. Properly fitting large cuff in place on R upper arm. BP confirmed manually.

## 2012-05-09 NOTE — ED Notes (Signed)
EDP resident at [facility].

## 2012-05-09 NOTE — ED Provider Notes (Signed)
I saw and evaluated the patient, reviewed the resident's note and I agree with the findings and plan.  I saw and agree with ECG interpretation by resident.  Pt with atypical CP, sharp, pleuritic component.  Risk factors include obesity.  Pt was to have CT chest due to + ddimer 4 days ago, pt left AMA.  Due to continued symptoms, presented for further eval.  Will get angio chest.  ECG shows no new ischemia,low clinical concern for ACS.  Troponin is neg.   If neg CT, will d/c home.  Gavin Pound. Oletta Lamas, MD 05/09/12 1610

## 2012-07-01 ENCOUNTER — Encounter (HOSPITAL_COMMUNITY): Payer: Self-pay | Admitting: *Deleted

## 2012-07-01 ENCOUNTER — Emergency Department (HOSPITAL_COMMUNITY)
Admission: EM | Admit: 2012-07-01 | Discharge: 2012-07-01 | Discharge status: Against Medical Advice | Payer: Medicaid Other | Attending: Emergency Medicine | Admitting: Emergency Medicine

## 2012-07-01 DIAGNOSIS — F17200 Nicotine dependence, unspecified, uncomplicated: Secondary | ICD-10-CM | POA: Insufficient documentation

## 2012-07-01 DIAGNOSIS — R21 Rash and other nonspecific skin eruption: Secondary | ICD-10-CM | POA: Insufficient documentation

## 2012-07-01 DIAGNOSIS — F411 Generalized anxiety disorder: Secondary | ICD-10-CM | POA: Insufficient documentation

## 2012-07-01 DIAGNOSIS — Z760 Encounter for issue of repeat prescription: Secondary | ICD-10-CM | POA: Insufficient documentation

## 2012-07-01 DIAGNOSIS — R51 Headache: Secondary | ICD-10-CM | POA: Insufficient documentation

## 2012-07-01 DIAGNOSIS — L989 Disorder of the skin and subcutaneous tissue, unspecified: Secondary | ICD-10-CM | POA: Insufficient documentation

## 2012-07-01 LAB — CBC WITH DIFFERENTIAL/PLATELET
Basophils Absolute: 0 10*3/uL (ref 0.0–0.1)
Basophils Relative: 0 % (ref 0–1)
Eosinophils Absolute: 0.1 10*3/uL (ref 0.0–0.7)
Eosinophils Relative: 1 % (ref 0–5)
MCH: 31.4 pg (ref 26.0–34.0)
MCHC: 35 g/dL (ref 30.0–36.0)
MCV: 89.7 fL (ref 78.0–100.0)
Platelets: 189 10*3/uL (ref 150–400)
RDW: 13.1 % (ref 11.5–15.5)

## 2012-07-01 LAB — COMPREHENSIVE METABOLIC PANEL
ALT: 22 U/L (ref 0–35)
AST: 31 U/L (ref 0–37)
Albumin: 3.8 g/dL (ref 3.5–5.2)
Calcium: 9.7 mg/dL (ref 8.4–10.5)
GFR calc Af Amer: >90 mL/min (ref >90)
Glucose, Bld: 160 mg/dL — ABNORMAL HIGH (ref 70–99)
Sodium: 138 mEq/L (ref 135–145)
Total Protein: 6.8 g/dL (ref 6.0–8.3)

## 2012-07-01 LAB — URINALYSIS, ROUTINE W REFLEX MICROSCOPIC
Bilirubin Urine: NEGATIVE
Hgb urine dipstick: NEGATIVE
Nitrite: NEGATIVE
Protein, ur: NEGATIVE mg/dL
Urobilinogen, UA: 0.2 mg/dL (ref 0.0–1.0)
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
## 2012-07-01 NOTE — ED Notes (Signed)
No answer x2 

## 2012-07-01 NOTE — ED Notes (Signed)
Called PT for A2 and no answer

## 2012-07-01 NOTE — ED Notes (Signed)
No answer x3. Unable to find. Not in w/r, triage, h/w, b/r, entry way or outside entrance.

## 2012-07-01 NOTE — ED Notes (Signed)
CBG checked 156

## 2012-07-04 ENCOUNTER — Encounter (HOSPITAL_COMMUNITY): Payer: Self-pay | Admitting: *Deleted

## 2012-07-04 ENCOUNTER — Inpatient Hospital Stay (HOSPITAL_COMMUNITY)
Admission: EM | Admit: 2012-07-04 | Discharge: 2012-07-07 | DRG: 601 | Disposition: A | Discharge status: Home / Self Care | Payer: Medicaid Other | Attending: Internal Medicine | Admitting: Internal Medicine

## 2012-07-04 DIAGNOSIS — Z9089 Acquired absence of other organs: Secondary | ICD-10-CM

## 2012-07-04 DIAGNOSIS — N61 Mastitis without abscess: Principal | ICD-10-CM | POA: Diagnosis present

## 2012-07-04 DIAGNOSIS — Z91038 Other insect allergy status: Secondary | ICD-10-CM

## 2012-07-04 DIAGNOSIS — IMO0002 Reserved for concepts with insufficient information to code with codable children: Secondary | ICD-10-CM

## 2012-07-04 DIAGNOSIS — S21009A Unspecified open wound of unspecified breast, initial encounter: Secondary | ICD-10-CM | POA: Diagnosis present

## 2012-07-04 DIAGNOSIS — Z886 Allergy status to analgesic agent status: Secondary | ICD-10-CM

## 2012-07-04 DIAGNOSIS — Z880 Allergy status to penicillin: Secondary | ICD-10-CM

## 2012-07-04 DIAGNOSIS — Z90710 Acquired absence of both cervix and uterus: Secondary | ICD-10-CM

## 2012-07-04 DIAGNOSIS — W269XXA Contact with unspecified sharp object(s), initial encounter: Secondary | ICD-10-CM | POA: Diagnosis present

## 2012-07-04 DIAGNOSIS — F17200 Nicotine dependence, unspecified, uncomplicated: Secondary | ICD-10-CM | POA: Diagnosis present

## 2012-07-04 DIAGNOSIS — F319 Bipolar disorder, unspecified: Secondary | ICD-10-CM | POA: Diagnosis present

## 2012-07-04 DIAGNOSIS — I2510 Atherosclerotic heart disease of native coronary artery without angina pectoris: Secondary | ICD-10-CM | POA: Diagnosis present

## 2012-07-04 DIAGNOSIS — E119 Type 2 diabetes mellitus without complications: Secondary | ICD-10-CM | POA: Diagnosis present

## 2012-07-04 DIAGNOSIS — E860 Dehydration: Secondary | ICD-10-CM | POA: Diagnosis present

## 2012-07-04 DIAGNOSIS — R569 Unspecified convulsions: Secondary | ICD-10-CM

## 2012-07-04 DIAGNOSIS — F209 Schizophrenia, unspecified: Secondary | ICD-10-CM | POA: Diagnosis present

## 2012-07-04 DIAGNOSIS — S298XXA Other specified injuries of thorax, initial encounter: Secondary | ICD-10-CM | POA: Diagnosis present

## 2012-07-04 DIAGNOSIS — G40909 Epilepsy, unspecified, not intractable, without status epilepticus: Secondary | ICD-10-CM | POA: Diagnosis present

## 2012-07-04 DIAGNOSIS — J4489 Other specified chronic obstructive pulmonary disease: Secondary | ICD-10-CM | POA: Diagnosis present

## 2012-07-04 DIAGNOSIS — Z859 Personal history of malignant neoplasm, unspecified: Secondary | ICD-10-CM

## 2012-07-04 DIAGNOSIS — I10 Essential (primary) hypertension: Secondary | ICD-10-CM | POA: Diagnosis present

## 2012-07-04 DIAGNOSIS — R079 Chest pain, unspecified: Secondary | ICD-10-CM

## 2012-07-04 DIAGNOSIS — J449 Chronic obstructive pulmonary disease, unspecified: Secondary | ICD-10-CM

## 2012-07-04 LAB — COMPREHENSIVE METABOLIC PANEL
ALT: 15 U/L (ref 0–35)
AST: 27 U/L (ref 0–37)
Albumin: 3.7 g/dL (ref 3.5–5.2)
CO2: 25 mEq/L (ref 19–32)
Calcium: 9.7 mg/dL (ref 8.4–10.5)
Creatinine, Ser: 0.71 mg/dL (ref 0.50–1.10)
Total Protein: 6.9 g/dL (ref 6.0–8.3)

## 2012-07-04 LAB — CBC WITH DIFFERENTIAL/PLATELET
Eosinophils Absolute: 0.1 10*3/uL (ref 0.0–0.7)
Hemoglobin: 13.3 g/dL (ref 12.0–15.0)
Lymphs Abs: 3.2 10*3/uL (ref 0.7–4.0)
MCH: 30.7 pg (ref 26.0–34.0)
Monocytes Relative: 7 % (ref 3–12)
Neutro Abs: 4.8 10*3/uL (ref 1.7–7.7)
Neutrophils Relative %: 55 % (ref 43–77)
RBC: 4.33 MIL/uL (ref 3.87–5.11)

## 2012-07-04 MED ORDER — SODIUM CHLORIDE 0.9 % IV SOLN
Freq: Once | INTRAVENOUS | Status: AC
  Administered 2012-07-04: 23:00:00 via INTRAVENOUS

## 2012-07-04 MED ORDER — VALPROATE SODIUM 500 MG/5ML IV SOLN
500.0000 mg | Freq: Once | INTRAVENOUS | Status: AC
  Administered 2012-07-04: 500 mg via INTRAVENOUS
  Filled 2012-07-04: qty 5

## 2012-07-04 MED ORDER — MORPHINE SULFATE 4 MG/ML IJ SOLN
4.0000 mg | Freq: Once | INTRAMUSCULAR | Status: AC
  Administered 2012-07-04: 4 mg via INTRAVENOUS
  Filled 2012-07-04: qty 1

## 2012-07-04 MED ORDER — LORAZEPAM 2 MG/ML IJ SOLN
1.0000 mg | Freq: Once | INTRAMUSCULAR | Status: AC
  Administered 2012-07-04: 1 mg via INTRAVENOUS
  Filled 2012-07-04: qty 1

## 2012-07-04 MED ORDER — TETANUS-DIPHTH-ACELL PERTUSSIS 5-2.5-18.5 LF-MCG/0.5 IM SUSP
0.5000 mL | Freq: Once | INTRAMUSCULAR | Status: AC
  Administered 2012-07-05: 0.5 mL via INTRAMUSCULAR
  Filled 2012-07-04: qty 0.5

## 2012-07-04 MED ORDER — ONDANSETRON HCL 4 MG/2ML IJ SOLN
4.0000 mg | Freq: Once | INTRAMUSCULAR | Status: AC
  Administered 2012-07-04: 4 mg via INTRAVENOUS
  Filled 2012-07-04: qty 2
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
## 2012-07-04 NOTE — ED Notes (Signed)
Patient with seizure in waiting room.  Patient moved to Room 14 at this time.
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
## 2012-07-05 ENCOUNTER — Encounter (HOSPITAL_COMMUNITY): Payer: Self-pay | Admitting: Internal Medicine

## 2012-07-05 ENCOUNTER — Emergency Department (HOSPITAL_COMMUNITY): Payer: Medicaid Other

## 2012-07-05 DIAGNOSIS — E119 Type 2 diabetes mellitus without complications: Secondary | ICD-10-CM

## 2012-07-05 DIAGNOSIS — N61 Mastitis without abscess: Secondary | ICD-10-CM

## 2012-07-05 DIAGNOSIS — G40909 Epilepsy, unspecified, not intractable, without status epilepticus: Secondary | ICD-10-CM | POA: Diagnosis present

## 2012-07-05 DIAGNOSIS — R569 Unspecified convulsions: Secondary | ICD-10-CM

## 2012-07-05 DIAGNOSIS — F319 Bipolar disorder, unspecified: Secondary | ICD-10-CM

## 2012-07-05 DIAGNOSIS — E860 Dehydration: Secondary | ICD-10-CM | POA: Diagnosis present

## 2012-07-05 LAB — CBC
HCT: 36.5 % (ref 36.0–46.0)
Hemoglobin: 12.6 g/dL (ref 12.0–15.0)
MCH: 30.7 pg (ref 26.0–34.0)
MCV: 89 fL (ref 78.0–100.0)
RBC: 4.1 MIL/uL (ref 3.87–5.11)
WBC: 8.3 10*3/uL (ref 4.0–10.5)

## 2012-07-05 LAB — GLUCOSE, CAPILLARY
Glucose-Capillary: 154 mg/dL — ABNORMAL HIGH (ref 70–99)
Glucose-Capillary: 149 mg/dL — ABNORMAL HIGH (ref 70–99)
Glucose-Capillary: 87 mg/dL (ref 70–99)

## 2012-07-05 LAB — LACTIC ACID, PLASMA: Lactic Acid, Venous: 1.4 mmol/L (ref 0.5–2.2)

## 2012-07-05 LAB — CREATININE, SERUM: GFR calc Af Amer: >90 mL/min (ref >90)

## 2012-07-05 MED ORDER — METFORMIN HCL ER 500 MG PO TB24
1000.0000 mg | ORAL_TABLET | Freq: Every day | ORAL | Status: DC
  Administered 2012-07-05: 1000 mg via ORAL
  Filled 2012-07-05 (×3): qty 2

## 2012-07-05 MED ORDER — VANCOMYCIN HCL 10 G IV SOLR
2000.0000 mg | Freq: Once | INTRAVENOUS | Status: DC
  Administered 2012-07-05: 2000 mg via INTRAVENOUS
  Filled 2012-07-05 (×2): qty 2000

## 2012-07-05 MED ORDER — INSULIN ASPART 100 UNIT/ML ~~LOC~~ SOLN: 0.0000 [IU] | Freq: Every day | SUBCUTANEOUS | Status: DC

## 2012-07-05 MED ORDER — OXYCODONE HCL 5 MG PO TABS
10.0000 mg | ORAL_TABLET | ORAL | Status: DC | PRN
  Administered 2012-07-05 – 2012-07-06 (×4): 10 mg via ORAL
  Filled 2012-07-05 (×4): qty 2

## 2012-07-05 MED ORDER — DIVALPROEX SODIUM 500 MG PO DR TAB
500.0000 mg | DELAYED_RELEASE_TABLET | Freq: Two times a day (BID) | ORAL | Status: DC
  Administered 2012-07-05 – 2012-07-07 (×6): 500 mg via ORAL
  Filled 2012-07-05 (×9): qty 1

## 2012-07-05 MED ORDER — SODIUM CHLORIDE 0.9 % IV SOLN
INTRAVENOUS | Status: AC
  Administered 2012-07-05: 01:00:00 via INTRAVENOUS

## 2012-07-05 MED ORDER — SODIUM CHLORIDE 0.9 % IV SOLN: Freq: Once | INTRAVENOUS | Status: DC

## 2012-07-05 MED ORDER — CLINDAMYCIN PHOSPHATE 900 MG/50ML IV SOLN
900.0000 mg | Freq: Three times a day (TID) | INTRAVENOUS | Status: DC
  Administered 2012-07-05 – 2012-07-06 (×4): 900 mg via INTRAVENOUS
  Filled 2012-07-05 (×7): qty 50

## 2012-07-05 MED ORDER — INSULIN ASPART 100 UNIT/ML ~~LOC~~ SOLN
0.0000 [IU] | Freq: Three times a day (TID) | SUBCUTANEOUS | Status: DC
  Administered 2012-07-05 (×2): 2 [IU] via SUBCUTANEOUS

## 2012-07-05 MED ORDER — ONDANSETRON HCL 4 MG/2ML IJ SOLN: 4.0000 mg | Freq: Four times a day (QID) | INTRAMUSCULAR | Status: DC | PRN

## 2012-07-05 MED ORDER — CLINDAMYCIN PHOSPHATE 900 MG/50ML IV SOLN: 900.0000 mg | Freq: Once | INTRAVENOUS | Status: DC

## 2012-07-05 MED ORDER — SODIUM CHLORIDE 0.9 % IV SOLN
1000.0000 mg | Freq: Once | INTRAVENOUS | Status: AC
  Administered 2012-07-05: 1000 mg via INTRAVENOUS
  Filled 2012-07-05: qty 10

## 2012-07-05 MED ORDER — HYDROMORPHONE HCL PF 1 MG/ML IJ SOLN
1.0000 mg | INTRAMUSCULAR | Status: DC | PRN
  Administered 2012-07-05: 1 mg via INTRAVENOUS
  Filled 2012-07-05 (×2): qty 1

## 2012-07-05 MED ORDER — INSULIN GLARGINE 100 UNIT/ML ~~LOC~~ SOLN
20.0000 [IU] | Freq: Two times a day (BID) | SUBCUTANEOUS | Status: DC
  Administered 2012-07-05 – 2012-07-07 (×6): 20 [IU] via SUBCUTANEOUS
  Filled 2012-07-05 (×7): qty 0.2

## 2012-07-05 MED ORDER — ONDANSETRON HCL 4 MG PO TABS: 4.0000 mg | ORAL_TABLET | Freq: Four times a day (QID) | ORAL | Status: DC | PRN

## 2012-07-05 MED ORDER — SODIUM CHLORIDE 0.9 % IJ SOLN
3.0000 mL | Freq: Two times a day (BID) | INTRAMUSCULAR | Status: DC
  Administered 2012-07-05 – 2012-07-07 (×5): 3 mL via INTRAVENOUS

## 2012-07-05 MED ORDER — ONDANSETRON HCL 4 MG/2ML IJ SOLN: 4.0000 mg | Freq: Three times a day (TID) | INTRAMUSCULAR | Status: DC | PRN

## 2012-07-05 MED ORDER — VANCOMYCIN HCL IN DEXTROSE 1-5 GM/200ML-% IV SOLN
1000.0000 mg | Freq: Two times a day (BID) | INTRAVENOUS | Status: DC
  Filled 2012-07-05: qty 200

## 2012-07-05 MED ORDER — THIOTHIXENE 10 MG PO CAPS
10.0000 mg | ORAL_CAPSULE | Freq: Every day | ORAL | Status: DC
  Administered 2012-07-05 – 2012-07-07 (×3): 10 mg via ORAL
  Filled 2012-07-05 (×3): qty 1

## 2012-07-05 MED ORDER — SULFAMETHOXAZOLE-TRIMETHOPRIM 400-80 MG/5ML IV SOLN
320.0000 mg | Freq: Three times a day (TID) | INTRAVENOUS | Status: DC
  Administered 2012-07-05 – 2012-07-06 (×4): 320 mg via INTRAVENOUS
  Filled 2012-07-05 (×10): qty 20

## 2012-07-05 MED ORDER — LINAGLIPTIN 5 MG PO TABS
5.0000 mg | ORAL_TABLET | Freq: Every day | ORAL | Status: DC
  Administered 2012-07-05 – 2012-07-07 (×3): 5 mg via ORAL
  Filled 2012-07-05 (×5): qty 1

## 2012-07-05 MED ORDER — SAXAGLIPTIN-METFORMIN ER 5-1000 MG PO TB24: 1.0000 | ORAL_TABLET | Freq: Every day | ORAL | Status: DC

## 2012-07-05 MED ORDER — DIPHENHYDRAMINE HCL 50 MG/ML IJ SOLN
12.5000 mg | Freq: Four times a day (QID) | INTRAMUSCULAR | Status: DC | PRN
  Administered 2012-07-05: 12.5 mg via INTRAVENOUS
  Filled 2012-07-05: qty 1

## 2012-07-05 MED ORDER — SODIUM CHLORIDE 0.9 % IV SOLN
Freq: Once | INTRAVENOUS | Status: AC
  Administered 2012-07-05: 04:00:00 via INTRAVENOUS

## 2012-07-05 MED ORDER — TRAZODONE HCL 150 MG PO TABS
150.0000 mg | ORAL_TABLET | Freq: Every day | ORAL | Status: DC
  Administered 2012-07-05 – 2012-07-06 (×2): 150 mg via ORAL
  Filled 2012-07-05 (×4): qty 1

## 2012-07-05 MED ORDER — HYDROMORPHONE HCL PF 1 MG/ML IJ SOLN: 1.0000 mg | INTRAMUSCULAR | Status: DC | PRN

## 2012-07-05 MED ORDER — ENOXAPARIN SODIUM 40 MG/0.4ML ~~LOC~~ SOLN
40.0000 mg | SUBCUTANEOUS | Status: DC
  Administered 2012-07-05 – 2012-07-06 (×2): 40 mg via SUBCUTANEOUS
  Filled 2012-07-05 (×4): qty 0.4
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
## 2012-07-05 NOTE — ED Provider Notes (Signed)
Medical screening examination/treatment/procedure(s) were performed by non-physician practitioner and as supervising physician I was immediately available for consultation/collaboration.  Jasmine Awe, MD 07/05/12 [phone]

## 2012-07-05 NOTE — Progress Notes (Signed)
Pt began intensely itching almost immediately after vancomycin infusion began, now to change to Septra.  Will begin Septra 320mg  IV Q8H and monitor for change to PO ABX.  Vernard Gambles, PharmD, BCPS 07/05/2012 4:21 AM

## 2012-07-05 NOTE — Progress Notes (Signed)
Utilization Review Completed Angeliz Settlemyre J. Tayia Stonesifer, RN, BSN, NCM [phone]  

## 2012-07-05 NOTE — Progress Notes (Signed)
The patient started itching intensely after giving the vancomycin.  The vancomycin was stopped and the NP on call was consulted.  New orders were given for Benadryl and the vancomycin was discontinued.  Clindamycin was discussed and the decision was made for it to remain in the medication regimen.  The RN will carry out the orders and continue to monitor the patient.
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
## 2012-07-05 NOTE — Progress Notes (Signed)
The patient arrived to 61.  The patient was placed on telemetry and oriented to the unit.  The patient was assessed and VS were taken.  The patient displayed child-like behavior with complaints of pain of 9 out of 10.  Dilaudid was given.  The call bell was placed within reach and the family is at the bedside.

## 2012-07-05 NOTE — ED Notes (Signed)
Pt transported to 4700 via stretcher on a monitor by Foye Clock NT, report given to 4700 RN
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
## 2012-07-06 LAB — CBC
Hemoglobin: 11.8 g/dL — ABNORMAL LOW (ref 12.0–15.0)
MCH: 30.2 pg (ref 26.0–34.0)
MCHC: 33.8 g/dL (ref 30.0–36.0)
RDW: 13.4 % (ref 11.5–15.5)

## 2012-07-06 LAB — GLUCOSE, CAPILLARY
Glucose-Capillary: 92 mg/dL (ref 70–99)
Glucose-Capillary: 97 mg/dL (ref 70–99)
Glucose-Capillary: 97 mg/dL (ref 70–99)
Glucose-Capillary: 115 mg/dL — ABNORMAL HIGH (ref 70–99)

## 2012-07-06 LAB — BASIC METABOLIC PANEL
BUN: 9 mg/dL (ref 6–23)
Calcium: 9.2 mg/dL (ref 8.4–10.5)
Creatinine, Ser: 0.96 mg/dL (ref 0.50–1.10)
GFR calc non Af Amer: 69 mL/min — ABNORMAL LOW (ref >90)
Glucose, Bld: 78 mg/dL (ref 70–99)
Potassium: 4.4 mEq/L (ref 3.5–5.1)

## 2012-07-06 MED ORDER — SULFAMETHOXAZOLE-TMP DS 800-160 MG PO TABS
2.0000 | ORAL_TABLET | Freq: Two times a day (BID) | ORAL | Status: DC
  Administered 2012-07-06 – 2012-07-07 (×2): 2 via ORAL
  Filled 2012-07-06 (×6): qty 2

## 2012-07-06 MED ORDER — CLINDAMYCIN HCL 150 MG PO CAPS
450.0000 mg | ORAL_CAPSULE | Freq: Three times a day (TID) | ORAL | Status: DC
  Administered 2012-07-06 – 2012-07-07 (×4): 450 mg via ORAL
  Filled 2012-07-06 (×8): qty 1

## 2012-07-06 NOTE — Progress Notes (Signed)
Cellulitis improving. Open site with fibrinous exudate No fluctuance, induration.   Cont abx  Mary Sella. Andrey Campanile, MD, FACS General, Bariatric, & Minimally Invasive Surgery [facility], [place]

## 2012-07-06 NOTE — Progress Notes (Signed)
Reported called, dressing applied per orders, transported by Arna Medici, NT.

## 2012-07-06 NOTE — Progress Notes (Signed)
[facility] PROGRESS NOTE  Carol Hughes ZOX:[id number] DOB: 1965/06/07 DOA: 07/04/2012 PCP: Gabriel Cirri, DO  Assessment/Plan: 1. Mastitis: clinda/bactrim: day #3- improving area of redness- no plans by surgery for any intervention 2. Bipolar: home meds 3. DM- diabetic diet; d/c metformin as she no longer takes; continue SSI and lantus- tight control to help with healing 4. Seizure d/o- home meds  Code Status: full Family Communication:  Sister at bedside Disposition Plan: home 1-2 days   Consultants:  surgery   Antibiotics:  Bactrim/clindamycin: 4/14  HPI/Subjective: Still having pain at site of breast  Objective: Filed Vitals:   07/05/12 1031 07/05/12 1417 07/05/12 2145 07/06/12 0531  BP: 88/37 90/49 110/56 103/55  Pulse: 60 62 65 98  Temp: 98 F (36.7 C) 98.6 F (37 C) 98.4 F (36.9 C) 97.5 F (36.4 C)  TempSrc: Oral Oral Oral Oral  Resp: 20 18 20 20   Height:      Weight:    102.3 kg (225 lb 8.5 oz)  SpO2: 100% 97% 100% 96%    Intake/Output Summary (Last 24 hours) at 07/06/12 1009 Last data filed at 07/06/12 0500  Gross per 24 hour  Intake   1343 ml  Output   1750 ml  Net   -407 ml   Filed Weights   07/05/12 0248 07/05/12 0649 07/06/12 0531  Weight: 106.696 kg (235 lb 3.6 oz) 106.414 kg (234 lb 9.6 oz) 102.3 kg (225 lb 8.5 oz)    Exam:   General:  A+Ox3, NAD  Cardiovascular: rrr  Respiratory: clear anterior  Abdomen: +Bs, Soft, NT  Musculoskeletal: moves all 4 ext  Skin:  tender L breast with redness from the wound site (lower left quadrant) towards the lateral half of her breast. There is some mild yellow-brown drainage from the inoculation site.  no fluctuance or induration of the site or surrounding area. Cellulitis has improved    Data Reviewed: Basic Metabolic Panel:  Recent Labs Lab 07/01/12 2007 07/04/12 2100 07/05/12 0248 07/06/12 0455  NA 138 139  --  143  K 3.9 3.5  --  4.4  CL 102 103  --  107  CO2 27 25   --  30  GLUCOSE 160* 142*  --  78  BUN 14 12  --  9  CREATININE 0.71 0.71 0.63 0.96  CALCIUM 9.7 9.7  --  9.2   Liver Function Tests:  Recent Labs Lab 07/01/12 2007 07/04/12 2100  AST 31 27  ALT 22 15  ALKPHOS 112 100  BILITOT 0.2* 0.4  PROT 6.8 6.9  ALBUMIN 3.8 3.7   No results found for this basename: LIPASE, AMYLASE,  in the last 168 hours No results found for this basename: AMMONIA,  in the last 168 hours CBC:  Recent Labs Lab 07/01/12 2007 07/04/12 2100 07/05/12 0248 07/06/12 0455  WBC 9.7 8.8 8.3 6.1  NEUTROABS 5.2 4.8  --   --   HGB 14.3 13.3 12.6 11.8*  HCT 40.9 38.2 36.5 34.9*  MCV 89.7 88.2 89.0 89.3  PLT 189 190 166 171   Cardiac Enzymes: No results found for this basename: CKTOTAL, CKMB, CKMBINDEX, TROPONINI,  in the last 168 hours BNP (last 3 results) No results found for this basename: PROBNP,  in the last 8760 hours CBG:  Recent Labs Lab 07/05/12 0641 07/05/12 1143 07/05/12 1612 07/05/12 2052 07/06/12 0550  GLUCAP 149* 87 154* 92 97    Recent Results (from the past 240 hour(s))  CULTURE, BLOOD (ROUTINE X 2)  Status: None   Collection Time    07/05/12  1:15 AM      Result Value Range Status   Specimen Description BLOOD RIGHT ARM   Final   Special Requests BOTTLES DRAWN AEROBIC ONLY 10CC   Final   Culture  Setup Time 07/05/2012 07:21   Final   Culture     Final   Value:        BLOOD CULTURE RECEIVED NO GROWTH TO DATE CULTURE WILL BE HELD FOR 5 DAYS BEFORE ISSUING A FINAL NEGATIVE REPORT   Report Status PENDING   Incomplete  CULTURE, BLOOD (ROUTINE X 2)     Status: None   Collection Time    07/05/12  1:20 AM      Result Value Range Status   Specimen Description BLOOD LEFT HAND   Final   Special Requests BOTTLES DRAWN AEROBIC ONLY 10CC   Final   Culture  Setup Time 07/05/2012 07:21   Final   Culture     Final   Value:        BLOOD CULTURE RECEIVED NO GROWTH TO DATE CULTURE WILL BE HELD FOR 5 DAYS BEFORE ISSUING A FINAL NEGATIVE  REPORT   Report Status PENDING   Incomplete     Studies: Dg Chest 2 View  07/05/2012  *RADIOLOGY REPORT*  Clinical Data: Seizure.  Low O2 saturation.  CHEST - 2 VIEW  Comparison: Chest x-ray dated 05/03/2012 and 01/22/2012  Findings: Heart size and pulmonary vascularity are normal and the lungs are clear.  No osseous abnormality.  No effusions.  IMPRESSION: Normal chest.   Original Report Authenticated By: Francene Boyers, M.D.     Scheduled Meds: . clindamycin (CLEOCIN) IV  900 mg Intravenous Q8H  . divalproex  500 mg Oral BID  . enoxaparin (LOVENOX) injection  40 mg Subcutaneous Q24H  . insulin aspart  0-15 Units Subcutaneous TID WC  . insulin aspart  0-5 Units Subcutaneous QHS  . insulin glargine  20 Units Subcutaneous BID  . linagliptin  5 mg Oral Q breakfast  . sodium chloride  3 mL Intravenous Q12H  . sulfamethoxazole-trimethoprim  320 mg Intravenous Q8H  . thiothixene  10 mg Oral Daily  . traZODone  150 mg Oral QHS   Continuous Infusions:   Principal Problem:   Mastitis in female Active Problems:   Diabetes mellitus, type 2   Bipolar disorder   Dehydration   Seizure disorder    Time spent: 35    [facility], Doranne Schmutz  [facility] Pager [phone]. If 7PM-7AM, please contact night-coverage at [email or web address], password [facility] 07/06/2012, 10:09 AM  LOS: 2 days

## 2012-07-06 NOTE — Consult Note (Signed)
I saw the patient, participated in the history, exam and medical decision making, and concur with the physician assistant's note above.  Correne Lalani M. Quynn Vilchis, MD, FACS General, Bariatric, & Minimally Invasive Surgery [facility] [place]   
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
## 2012-07-07 LAB — GLUCOSE, CAPILLARY: Glucose-Capillary: 77 mg/dL (ref 70–99)

## 2012-07-07 MED ORDER — CLINDAMYCIN HCL 150 MG PO CAPS: 450.0000 mg | ORAL_CAPSULE | Freq: Three times a day (TID) | ORAL | Status: DC

## 2012-07-07 MED ORDER — SULFAMETHOXAZOLE-TMP DS 800-160 MG PO TABS: 2.0000 | ORAL_TABLET | Freq: Two times a day (BID) | ORAL | Status: DC

## 2012-07-07 MED ORDER — OXYCODONE HCL 5 MG PO TABS: 5.0000 mg | ORAL_TABLET | ORAL | Status: DC | PRN

## 2012-07-07 NOTE — Progress Notes (Signed)
  Subjective: Still c/o of some breast pain. Wants to go home  Objective: Vital signs in last 24 hours: Temp:  [98.4 F (36.9 C)-99.5 F (37.5 C)] 98.4 F (36.9 C) (04/17 0900) Pulse Rate:  [63-66] 64 (04/17 0900) Resp:  [18-19] 19 (04/17 0900) BP: (93-104)/(41-76) 95/42 mmHg (04/17 0900) SpO2:  [96 %-99 %] 99 % (04/17 0900) Last BM Date: 07/04/12  Intake/Output from previous day: 04/16 0701 - 04/17 0700 In: 240 [P.O.:240] Out: -  Intake/Output this shift:    Examined with chaperone L breast - improving cellulitis. Not as intense. Open sore unchanged.   Lab Results:   Recent Labs  07/05/12 0248 07/06/12 0455  WBC 8.3 6.1  HGB 12.6 11.8*  HCT 36.5 34.9*  PLT 166 171   BMET  Recent Labs  07/04/12 2100 07/05/12 0248 07/06/12 0455  NA 139  --  143  K 3.5  --  4.4  CL 103  --  107  CO2 25  --  30  GLUCOSE 142*  --  78  BUN 12  --  9  CREATININE 0.71 0.63 0.96  CALCIUM 9.7  --  9.2   PT/INR No results found for this basename: LABPROT, INR,  in the last 72 hours ABG No results found for this basename: PHART, PCO2, PO2, HCO3,  in the last 72 hours  Studies/Results: No results found.  Anti-infectives: Anti-infectives   Start     Dose/Rate Route Frequency Ordered Stop   07/06/12 2000  sulfamethoxazole-trimethoprim (BACTRIM DS) 800-160 MG per tablet 2 tablet     2 tablet Oral Every 12 hours 07/06/12 1338     07/06/12 1400  clindamycin (CLEOCIN) capsule 450 mg     450 mg Oral 3 times per day 07/06/12 1338     07/05/12 1200  vancomycin (VANCOCIN) IVPB 1000 mg/200 mL premix  Status:  Discontinued     1,000 mg 200 mL/hr over 60 Minutes Intravenous Every 12 hours 07/05/12 0000 07/05/12 0237   07/05/12 1200  vancomycin (VANCOCIN) IVPB 1000 mg/200 mL premix  Status:  Discontinued     1,000 mg 200 mL/hr over 60 Minutes Intravenous Every 12 hours 07/05/12 0242 07/05/12 0359   07/05/12 0600  clindamycin (CLEOCIN) IVPB 900 mg  Status:  Discontinued     900  mg 100 mL/hr over 30 Minutes Intravenous 3 times per day 07/05/12 0237 07/06/12 1337   07/05/12 0500  sulfamethoxazole-trimethoprim (BACTRIM) 320 mg in dextrose 5 % 500 mL IVPB  Status:  Discontinued     320 mg 346.7 mL/hr over 90 Minutes Intravenous 3 times per day 07/05/12 0421 07/06/12 1335   07/05/12 0030  clindamycin (CLEOCIN) IVPB 900 mg  Status:  Discontinued     900 mg 100 mL/hr over 30 Minutes Intravenous  Once 07/05/12 0016 07/06/12 0942   07/05/12 0015  vancomycin (VANCOCIN) 2,000 mg in sodium chloride 0.9 % 500 mL IVPB  Status:  Discontinued     2,000 mg 250 mL/hr over 120 Minutes Intravenous  Once 07/05/12 0000 07/05/12 0359      Assessment/Plan: L breast mastitis Cont local wound care Cont oral abx Can go home from my POV with respect to breast mastitis Needs a total of 10 days of abx  Carol Hughes M. Andrey Campanile, MD, FACS General, Bariatric, & Minimally Invasive Surgery [facility], [place]   LOS: 3 days    Carol Hughes 07/07/2012

## 2012-07-07 NOTE — Consult Note (Cosign Needed)
Carol Jeffrey EdD 
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
## 2012-07-11 LAB — CULTURE, BLOOD (ROUTINE X 2)
Culture: NO GROWTH
Culture: NO GROWTH

## 2012-09-11 ENCOUNTER — Emergency Department (HOSPITAL_COMMUNITY)
Admission: EM | Admit: 2012-09-11 | Discharge: 2012-09-11 | Disposition: A | Discharge status: Home / Self Care | Payer: Medicaid Other | Attending: Emergency Medicine | Admitting: Emergency Medicine

## 2012-09-11 ENCOUNTER — Encounter (HOSPITAL_COMMUNITY): Payer: Self-pay | Admitting: Nurse Practitioner

## 2012-09-11 DIAGNOSIS — E119 Type 2 diabetes mellitus without complications: Secondary | ICD-10-CM | POA: Insufficient documentation

## 2012-09-11 DIAGNOSIS — Z85819 Personal history of malignant neoplasm of unspecified site of lip, oral cavity, and pharynx: Secondary | ICD-10-CM | POA: Insufficient documentation

## 2012-09-11 DIAGNOSIS — L299 Pruritus, unspecified: Secondary | ICD-10-CM | POA: Insufficient documentation

## 2012-09-11 DIAGNOSIS — Z79899 Other long term (current) drug therapy: Secondary | ICD-10-CM | POA: Insufficient documentation

## 2012-09-11 DIAGNOSIS — Z8669 Personal history of other diseases of the nervous system and sense organs: Secondary | ICD-10-CM | POA: Insufficient documentation

## 2012-09-11 DIAGNOSIS — J449 Chronic obstructive pulmonary disease, unspecified: Secondary | ICD-10-CM | POA: Insufficient documentation

## 2012-09-11 DIAGNOSIS — B86 Scabies: Secondary | ICD-10-CM | POA: Insufficient documentation

## 2012-09-11 DIAGNOSIS — I2510 Atherosclerotic heart disease of native coronary artery without angina pectoris: Secondary | ICD-10-CM | POA: Insufficient documentation

## 2012-09-11 DIAGNOSIS — I10 Essential (primary) hypertension: Secondary | ICD-10-CM | POA: Insufficient documentation

## 2012-09-11 DIAGNOSIS — F17200 Nicotine dependence, unspecified, uncomplicated: Secondary | ICD-10-CM | POA: Insufficient documentation

## 2012-09-11 DIAGNOSIS — Z880 Allergy status to penicillin: Secondary | ICD-10-CM | POA: Insufficient documentation

## 2012-09-11 DIAGNOSIS — Z8659 Personal history of other mental and behavioral disorders: Secondary | ICD-10-CM | POA: Insufficient documentation

## 2012-09-11 DIAGNOSIS — Z794 Long term (current) use of insulin: Secondary | ICD-10-CM | POA: Insufficient documentation

## 2012-09-11 DIAGNOSIS — J4489 Other specified chronic obstructive pulmonary disease: Secondary | ICD-10-CM | POA: Insufficient documentation

## 2012-09-11 MED ORDER — PERMETHRIN 5 % EX CREA: TOPICAL_CREAM | CUTANEOUS | Status: DC

## 2012-09-11 NOTE — ED Notes (Signed)
[place] sandwich and diet drink given to pt.

## 2012-09-11 NOTE — ED Notes (Signed)
Per ems: pt thinks she was bitten by a bug last night and c/o itchy painful arms since. No obvious bites or rash noted.

## 2012-09-11 NOTE — ED Provider Notes (Signed)
History  This chart was scribed for Shelda Jakes, MD by Ardelia Mems, ED Scribe. This patient was seen in room TR08C/TR08C and the patient's care was started at 7:04 PM.   CSN: [id number]  Arrival date & time 09/11/12  1834     Chief Complaint  Patient presents with  . Insect Bite     The history is provided by the patient. No language interpreter was used.    HPI Comments: Carol Hughes is a 47 y.o. Female with a hx of schizophrenia, HTN, DM2 who presents to the Emergency Department complaining of small, itchy bumps over her arms bilaterally and in the webspace of her fingers that started last night and worsened when she woke up this morning. Pt not experiencing pain, per se, but uncomfortable with the itching. Localized. Nothing makes it better or worse. No interventions tried. Denies fever, shortness of breath, chest pain, nausea, vomiting. Pt walked here and is requesting a cab voucher.  PCP- Dr. Gabriel Cirri   Past Medical History  Diagnosis Date  . Hypertension   . Seizures   . COPD (chronic obstructive pulmonary disease)   . Diabetes mellitus without complication   . Cancer throat  . Sleep apnea   . Coronary artery disease   . Schizophrenia     Past Surgical History  Procedure Laterality Date  . Abdominal hysterectomy    . Appendectomy    . Cholecystectomy    . Eye surgery    . Cesarean section    . Knee surgery      History reviewed. No pertinent family history.  History  Substance Use Topics  . Smoking status: Current Every Day Smoker -- 0.50 packs/day for 30 years    Types: Cigarettes  . Smokeless tobacco: Never Used  . Alcohol Use: No    OB History   Grav Para Term Preterm Abortions TAB SAB Ect Mult Living                  Review of Systems  Constitutional: Negative for fever and diaphoresis.  HENT: Negative for neck pain and neck stiffness.   Eyes: Negative for visual disturbance.  Respiratory: Negative for apnea, chest  tightness and shortness of breath.   Cardiovascular: Negative for chest pain and palpitations.  Gastrointestinal: Negative for nausea, vomiting, diarrhea and constipation.  Genitourinary: Negative for dysuria.  Musculoskeletal: Negative for gait problem.  Skin: Positive for rash.       Bilateral arms. Webbing of fingers bilaterally.  Neurological: Negative for dizziness, weakness, light-headedness, numbness and headaches.   A complete 10 system review of systems was obtained and all systems are negative except as noted in the HPI and PMH.   Allergies  Bee venom; Aspirin; Other; Penicillins; and Vancomycin  Home Medications   Current Outpatient Rx  Name  Route  Sig  Dispense  Refill  . albuterol (PROVENTIL HFA;VENTOLIN HFA) 108 (90 BASE) MCG/ACT inhaler   Inhalation   Inhale 2 puffs into the lungs every 6 (six) hours as needed for wheezing or shortness of breath.          . Insulin Glargine (LANTUS SOLOSTAR [place])   Subcutaneous   Inject 20 Units into the skin 2 (two) times daily.         . traZODone (DESYREL) 150 MG tablet   Oral   Take 150 mg by mouth at bedtime as needed for sleep.          . nitroGLYCERIN (NITROSTAT) 0.4 MG SL  tablet   Sublingual   Place 0.4 mg under the tongue every 5 (five) minutes as needed for chest pain.           Triage Vitals: BP 158/73  Pulse 60  Temp(Src) 98.3 F (36.8 C) (Oral)  Resp 16  SpO2 96%  Physical Exam  Nursing note and vitals reviewed. Constitutional: She is oriented to person, place, and time. She appears well-developed and well-nourished. No distress.  HENT:  Head: Normocephalic and atraumatic.  Eyes: Conjunctivae and EOM are normal.  Neck: Normal range of motion. Neck supple.  No meningeal signs  Cardiovascular: Normal rate, regular rhythm and normal heart sounds.  Exam reveals no gallop and no friction rub.   No murmur heard. Pulmonary/Chest: Effort normal and breath sounds normal. No respiratory distress. She has  no wheezes. She has no rales. She exhibits no tenderness.  Abdominal: Soft. Bowel sounds are normal. She exhibits no distension. There is no tenderness. There is no rebound and no guarding.  Musculoskeletal: Normal range of motion. She exhibits no edema and no tenderness.  Neurological: She is alert and oriented to person, place, and time. No cranial nerve deficit.  Skin: Skin is warm and dry. Rash noted. She is not diaphoretic. No erythema.  Discrete red, pruritic papules over posterior surface of bilateral forearms, flexor surface of bilateral wrists and burrowing in webbing of fingers bilaterally.     ED Course  Procedures (including critical care time)  DIAGNOSTIC STUDIES: Oxygen Saturation is 96% on RA, normal by my interpretation.    COORDINATION OF CARE: 7:06 PM- Pt advised of plan for treatment and pt agrees.     Labs Reviewed - No data to display No results found.   1. Scabies       MDM  Discussed diagnosis & treatment of scabies with parents.  They have been advised to followup with her primary care doctor 2 weeks after treatment.  They have also been advised to clean entire household including washing sheets and using R.I.D. spray in the car and on sofa.   The use of permethrin cream was discussed as well, they were told to use cream on affected areas, leaving on for 12 hours.  They've been advised to repeat treatment if new eruptions occur. Patient's parents verbalized understanding.   I personally performed the services described in this documentation, which was scribed in my presence. The recorded information has been reviewed and is accurate.    Glade Nurse, PA-C 09/11/12 2328

## 2012-09-13 NOTE — ED Provider Notes (Signed)
Medical screening examination/treatment/procedure(s) were performed by non-physician practitioner and as supervising physician I was immediately available for consultation/collaboration.   Rimsha Trembley W. Carolyna Yerian, MD 09/13/12 0758 

## 2012-11-24 ENCOUNTER — Emergency Department (HOSPITAL_COMMUNITY): Payer: Medicaid Other

## 2012-11-24 ENCOUNTER — Encounter (HOSPITAL_COMMUNITY): Payer: Self-pay | Admitting: Emergency Medicine

## 2012-11-24 ENCOUNTER — Emergency Department (HOSPITAL_COMMUNITY)
Admission: EM | Admit: 2012-11-24 | Discharge: 2012-11-24 | Disposition: A | Discharge status: Home / Self Care | Payer: Medicaid Other | Attending: Emergency Medicine | Admitting: Emergency Medicine

## 2012-11-24 DIAGNOSIS — R0789 Other chest pain: Secondary | ICD-10-CM | POA: Insufficient documentation

## 2012-11-24 DIAGNOSIS — F17200 Nicotine dependence, unspecified, uncomplicated: Secondary | ICD-10-CM | POA: Insufficient documentation

## 2012-11-24 DIAGNOSIS — R059 Cough, unspecified: Secondary | ICD-10-CM | POA: Insufficient documentation

## 2012-11-24 DIAGNOSIS — B9789 Other viral agents as the cause of diseases classified elsewhere: Secondary | ICD-10-CM

## 2012-11-24 DIAGNOSIS — E119 Type 2 diabetes mellitus without complications: Secondary | ICD-10-CM | POA: Insufficient documentation

## 2012-11-24 DIAGNOSIS — I10 Essential (primary) hypertension: Secondary | ICD-10-CM | POA: Insufficient documentation

## 2012-11-24 DIAGNOSIS — Z79899 Other long term (current) drug therapy: Secondary | ICD-10-CM | POA: Insufficient documentation

## 2012-11-24 DIAGNOSIS — J029 Acute pharyngitis, unspecified: Secondary | ICD-10-CM | POA: Insufficient documentation

## 2012-11-24 DIAGNOSIS — Z880 Allergy status to penicillin: Secondary | ICD-10-CM | POA: Insufficient documentation

## 2012-11-24 DIAGNOSIS — I2510 Atherosclerotic heart disease of native coronary artery without angina pectoris: Secondary | ICD-10-CM | POA: Insufficient documentation

## 2012-11-24 DIAGNOSIS — J449 Chronic obstructive pulmonary disease, unspecified: Secondary | ICD-10-CM | POA: Insufficient documentation

## 2012-11-24 DIAGNOSIS — J4489 Other specified chronic obstructive pulmonary disease: Secondary | ICD-10-CM | POA: Insufficient documentation

## 2012-11-24 DIAGNOSIS — F209 Schizophrenia, unspecified: Secondary | ICD-10-CM | POA: Insufficient documentation

## 2012-11-24 DIAGNOSIS — R110 Nausea: Secondary | ICD-10-CM | POA: Insufficient documentation

## 2012-11-24 DIAGNOSIS — Z85819 Personal history of malignant neoplasm of unspecified site of lip, oral cavity, and pharynx: Secondary | ICD-10-CM | POA: Insufficient documentation

## 2012-11-24 DIAGNOSIS — R05 Cough: Secondary | ICD-10-CM

## 2012-11-24 DIAGNOSIS — G40909 Epilepsy, unspecified, not intractable, without status epilepticus: Secondary | ICD-10-CM | POA: Insufficient documentation

## 2012-11-24 DIAGNOSIS — G4730 Sleep apnea, unspecified: Secondary | ICD-10-CM | POA: Insufficient documentation

## 2012-11-24 LAB — GLUCOSE, CAPILLARY: Glucose-Capillary: 99 mg/dL (ref 70–99)

## 2012-11-24 LAB — RAPID STREP SCREEN (MED CTR MEBANE ONLY): Streptococcus, Group A Screen (Direct): NEGATIVE

## 2012-11-24 MED ORDER — GUAIFENESIN 100 MG/5ML PO SYRP: 100.0000 mg | ORAL_SOLUTION | ORAL | Status: DC | PRN

## 2012-11-24 MED ORDER — MAGIC MOUTHWASH W/LIDOCAINE: 5.0000 mL | Freq: Four times a day (QID) | ORAL | Status: DC | PRN

## 2012-11-24 NOTE — ED Provider Notes (Signed)
Medical screening examination/treatment/procedure(s) were performed by non-physician practitioner and as supervising physician I was immediately available for consultation/collaboration.   Carol Hughes. Arvle Grabe, MD 11/24/12 1756

## 2012-11-24 NOTE — ED Notes (Signed)
Pt wanted sandwich. Spoke with PA. Gave pt ham sandwich.

## 2012-11-24 NOTE — ED Notes (Addendum)
Pt states she has had chest pain for past 2-3 days since cough began. States she also has been having nausea, no vomiting. Pt states she also has HA. Pt states cough has been going on for 2 weeks. 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 Pt states that po fluids take longer to get down.

## 2012-11-24 NOTE — ED Provider Notes (Signed)
CSN: [id number]     Arrival date & time 11/24/12  1423 History   First MD Initiated Contact with Patient 11/24/12 1515     Chief Complaint  Patient presents with  . Oral Swelling  . Cough   (Consider location/radiation/quality/duration/timing/severity/associated sxs/prior Treatment) HPI Comments: Patient is a 47 year old female with a history of IDDM, HTN, CAD, seizures, schizophrenia, and throat CA in remission since 2008 who presents for cough x 3 days. Patient states the cough is dry, intermittent, and worsening since onset. She has taken tylenol for symptoms without relief. Cough is associated with chest tightness, especially with deep inspiration, and nausea.   Patient also endorsing progressive dysphagia since cough began. She states that she is able to tolerate liquids and her secretions without difficulty, but has started choking on solid foods. Patient says it feels as though her "tongue is swelling". She has also found her R anterior neck to be increasingly tender on palpation. She is concerned that her symptoms are related to a recurrence of her throat CA as she states her symptoms began similarly in 2008. Patient denies CA resection, stating that she only underwent chemotherapy. She denies being told to follow up serially after CA was found to have gone into "remission". All of her tx for her CA was completed in [place], [place] per patient.  She denies associated fevers, nasal congestion, rhinorrhea, ear discharge, neck stiffness, substernal chest pain, emesis, diarrhea, melena, hematochezia, and numbness/tingling.  Patient is a 47 y.o. female presenting with cough. The history is provided by the patient. No language interpreter was used.  Cough Associated symptoms: sore throat   Associated symptoms: no chest pain, no fever, no rhinorrhea and no shortness of breath     Past Medical History  Diagnosis Date  . Hypertension   . Seizures   . COPD (chronic obstructive pulmonary  disease)   . Diabetes mellitus without complication   . Cancer throat  . Sleep apnea   . Coronary artery disease   . Schizophrenia    Past Surgical History  Procedure Laterality Date  . Abdominal hysterectomy    . Appendectomy    . Cholecystectomy    . Eye surgery    . Cesarean section    . Knee surgery     History reviewed. No pertinent family history. History  Substance Use Topics  . Smoking status: Current Every Day Smoker -- 0.50 packs/day for 30 years    Types: Cigarettes  . Smokeless tobacco: Never Used  . Alcohol Use: No   OB History   Grav Para Term Preterm Abortions TAB SAB Ect Mult Living                 Review of Systems  Constitutional: Negative for fever.  HENT: Positive for sore throat and trouble swallowing. Negative for congestion, facial swelling, rhinorrhea, drooling and ear discharge.   Eyes: Negative for visual disturbance.  Respiratory: Positive for cough and chest tightness. Negative for shortness of breath.   Cardiovascular: Negative for chest pain.  Gastrointestinal: Positive for nausea. Negative for vomiting and diarrhea.  Neurological: Negative for weakness and numbness.  All other systems reviewed and are negative.   Allergies  Bee venom; Aspirin; Other; Penicillins; and Vancomycin  Home Medications   Current Outpatient Rx  Name  Route  Sig  Dispense  Refill  . divalproex (DEPAKOTE ER) 500 MG 24 hr tablet   Oral   Take 500 mg by mouth 2 (two) times daily.         Marland Kitchen  enalapril (VASOTEC) 5 MG tablet   Oral   Take 5 mg by mouth daily.         . Insulin Glargine (LANTUS SOLOSTAR [place])   Subcutaneous   Inject 20 Units into the skin 2 (two) times daily.         . nitroGLYCERIN (NITROSTAT) 0.4 MG SL tablet   Sublingual   Place 0.4 mg under the tongue every 5 (five) minutes as needed for chest pain.         . paliperidone (INVEGA) 3 MG 24 hr tablet   Oral   Take 3 mg by mouth at bedtime.         . promethazine (PHENERGAN) 25  MG tablet   Oral   Take 25 mg by mouth every 8 (eight) hours as needed for nausea.         . Saxagliptin-Metformin (KOMBIGLYZE XR) 07-998 MG TB24   Oral   Take 1 tablet by mouth daily.         . traZODone (DESYREL) 150 MG tablet   Oral   Take 150 mg by mouth at bedtime as needed for sleep.          . Vitamin D, Ergocalciferol, (DRISDOL) 50000 UNITS CAPS capsule   Oral   Take 50,000 Units by mouth every 7 (seven) days. monday         . albuterol (PROVENTIL HFA;VENTOLIN HFA) 108 (90 BASE) MCG/ACT inhaler   Inhalation   Inhale 2 puffs into the lungs every 6 (six) hours as needed for wheezing or shortness of breath.          . Alum & Mag Hydroxide-Simeth (MAGIC MOUTHWASH W/LIDOCAINE) SOLN   Oral   Take 5 mLs by mouth 4 (four) times daily as needed.   120 mL   0     Benadryl, Maalox, and Viscous lidocaine   . guaifenesin (ROBITUSSIN) 100 MG/5ML syrup   Oral   Take 5-10 mLs (100-200 mg total) by mouth every 4 (four) hours as needed for cough.   60 mL   0   . permethrin (ELIMITE) 5 % cream      Apply to hands, arms, and chest thoroughly one time. Do not shower for 12 hours.   60 g   0    BP 105/64  Pulse 86  Temp(Src) 98.7 F (37.1 C) (Oral)  Resp 20  SpO2 97%  Physical Exam  Nursing note and vitals reviewed. Constitutional: She is oriented to person, place, and time. She appears well-developed and well-nourished. No distress.  HENT:  Head: Normocephalic and atraumatic.  Right Ear: Tympanic membrane, external ear and ear canal normal. No tenderness.  Left Ear: Tympanic membrane, external ear and ear canal normal. No tenderness.  Nose: Nose normal.  Mouth/Throat: Uvula is midline and mucous membranes are normal. No oral lesions. No trismus in the jaw. No edematous. No posterior oropharyngeal edema.  Airway patent without erythema or edema; mallampati score III. Patient tolerating secretions without difficulty.  Eyes: Conjunctivae and EOM are normal. Pupils  are equal, round, and reactive to light. No scleral icterus.  Neck: Normal range of motion.  Cardiovascular: Normal rate, regular rhythm and normal heart sounds.   Pulmonary/Chest: Effort normal. No respiratory distress. She has no wheezes. She has no rales.  + adventitious sounds c/w smoking hx  Abdominal: Soft. There is no tenderness. There is no rebound and no guarding.  Musculoskeletal: Normal range of motion.  Neurological: She is alert and oriented to  person, place, and time.  Skin: Skin is warm and dry. No rash noted. She is not diaphoretic. No erythema. No pallor.  Psychiatric: She has a normal mood and affect. Her behavior is normal.   ED Course  Procedures (including critical care time) Labs Review Labs Reviewed  RAPID STREP SCREEN  CULTURE, GROUP A STREP  GLUCOSE, CAPILLARY   Imaging Review Dg Neck Soft Tissue  11/24/2012   *RADIOLOGY REPORT*  Clinical Data: Oral swelling, cough  NECK SOFT TISSUES - 1+ VIEW  Comparison: CT scan of the head and neck 03/18/2010  Findings: Lateral view of the soft tissues of the neck demonstrates no acute osseous abnormality or prevertebral soft tissue swelling. The oropharynx, hypopharynx and airway appear within normal limits. The epiglottis is well defined.  Calcifications noted incidentally in the thyroid cartilage posteriorly.  IMPRESSION: No acute abnormality.   Original Report Authenticated By: Malachy Moan, M.D.   Dg Chest 2 View  11/24/2012   *RADIOLOGY REPORT*  Clinical Data: Cough and shortness of breath  CHEST - 2 VIEW  Comparison: July 05, 2012  Findings: Clear.  Heart size and pulmonary vascularity are normal. No adenopathy.  There is thoracolumbar levoscoliosis.  There is mild degenerative change in the thoracic spine.  IMPRESSION: No edema or consolidation.   Original Report Authenticated By: Bretta Bang, M.D.    Date: 11/24/2012  Rate: 75  Rhythm: normal sinus rhythm  QRS Axis: normal  Intervals: normal  ST/T Wave  abnormalities: normal  Conduction Disutrbances:none  Narrative Interpretation: NSR without STEMI or ischemic change  Old EKG Reviewed: unchanged from 05/08/12  I have personally reviewed and interpreted this EKG  MDM   1. Cough   2. Sore throat (viral)    Patient is a 47 year old female who presents for a cough x 2 weeks and sore throat x 3 days with difficulty swallowing solid foods. Patient is well and nontoxic appearing on arrival, hemodynamically stable, and afebrile. Airway patent and patient tolerating secretions without difficulty. There is no posterior oropharyngeal edema. Uvula midline without evidence of peritonsillar abscess. Strep screen negative. Patient with history of throat cancer and states she is concerned that her cancer has returned. DG soft tissues neck without any evidence of acute abnormality or mass. Chest xray without evidence of PNA, PTX, pleural effusion, or other acute cardiopulmonary abnormality. Suspect chest tightness to be secondary to costochondritis from persistent coughing.   Patient has tolerated fluids as well as a sandwich, peanut butter, and graham crackers by mouth without difficulty. In light of reassuring workup, believe patient is stable for discharge with gastroenterology followup for further evaluation of symptoms. I suggested that patient have outpatient endoscopy done to assess her odynophagia given her hx of throat CA. Patient given prescriptions for Robitussin and Magic mouthwash with lidocaine for symptoms. Return precautions advised and patient agreeable to plan with no unaddressed concerns.  Filed Vitals:   11/24/12 1427 11/24/12 1428 11/24/12 1747  BP: 105/64  109/66  Pulse: 86  75  Temp: 98.7 F (37.1 C)  97.9 F (36.6 C)  TempSrc: Oral  Oral  Resp: 20  15  SpO2: 100% 97% 96%      Antony Madura, PA-C 11/24/12 1752

## 2012-11-24 NOTE — ED Notes (Signed)
Pt tolerating ham sandwich

## 2012-11-24 NOTE — ED Notes (Signed)
Bed: WA16 Expected date:  Expected time:  Means of arrival:  Comments: ems 

## 2012-11-24 NOTE — ED Notes (Signed)
Per EMS. Pt complains of non-productive cough and sore throat for past 2-3 days. Reports throat feels swollen. States she has not been eating as much and has lost 50lb in last month. Also states CBG has been higher than usual lately.

## 2012-11-26 LAB — CULTURE, GROUP A STREP

## 2013-01-02 ENCOUNTER — Emergency Department (HOSPITAL_COMMUNITY)
Admission: EM | Admit: 2013-01-02 | Discharge: 2013-01-02 | Discharge status: Against Medical Advice | Payer: Medicaid Other | Attending: Emergency Medicine | Admitting: Emergency Medicine

## 2013-01-02 ENCOUNTER — Encounter (HOSPITAL_COMMUNITY): Payer: Self-pay | Admitting: Emergency Medicine

## 2013-01-02 DIAGNOSIS — I10 Essential (primary) hypertension: Secondary | ICD-10-CM | POA: Insufficient documentation

## 2013-01-02 DIAGNOSIS — F17200 Nicotine dependence, unspecified, uncomplicated: Secondary | ICD-10-CM | POA: Insufficient documentation

## 2013-01-02 DIAGNOSIS — J449 Chronic obstructive pulmonary disease, unspecified: Secondary | ICD-10-CM | POA: Insufficient documentation

## 2013-01-02 DIAGNOSIS — R51 Headache: Secondary | ICD-10-CM | POA: Insufficient documentation

## 2013-01-02 DIAGNOSIS — I2510 Atherosclerotic heart disease of native coronary artery without angina pectoris: Secondary | ICD-10-CM | POA: Insufficient documentation

## 2013-01-02 DIAGNOSIS — E119 Type 2 diabetes mellitus without complications: Secondary | ICD-10-CM | POA: Insufficient documentation

## 2013-01-02 DIAGNOSIS — J4489 Other specified chronic obstructive pulmonary disease: Secondary | ICD-10-CM | POA: Insufficient documentation

## 2013-01-02 HISTORY — DX: Migraine, unspecified, not intractable, without status migrainosus: G43.909

## 2013-01-02 NOTE — ED Notes (Signed)
Per EMS: pt c/o HA starting today; pt sts hx of migraine with some photophobia today

## 2013-01-04 ENCOUNTER — Encounter (HOSPITAL_COMMUNITY): Payer: Self-pay | Admitting: Emergency Medicine

## 2013-01-04 ENCOUNTER — Emergency Department (HOSPITAL_COMMUNITY)
Admission: EM | Admit: 2013-01-04 | Discharge: 2013-01-04 | Disposition: A | Discharge status: Home / Self Care | Payer: Medicaid Other | Attending: Emergency Medicine | Admitting: Emergency Medicine

## 2013-01-04 ENCOUNTER — Emergency Department (HOSPITAL_COMMUNITY): Payer: Medicaid Other

## 2013-01-04 DIAGNOSIS — E119 Type 2 diabetes mellitus without complications: Secondary | ICD-10-CM | POA: Insufficient documentation

## 2013-01-04 DIAGNOSIS — F319 Bipolar disorder, unspecified: Secondary | ICD-10-CM | POA: Insufficient documentation

## 2013-01-04 DIAGNOSIS — R209 Unspecified disturbances of skin sensation: Secondary | ICD-10-CM | POA: Insufficient documentation

## 2013-01-04 DIAGNOSIS — G43909 Migraine, unspecified, not intractable, without status migrainosus: Secondary | ICD-10-CM | POA: Insufficient documentation

## 2013-01-04 DIAGNOSIS — F29 Unspecified psychosis not due to a substance or known physiological condition: Secondary | ICD-10-CM | POA: Insufficient documentation

## 2013-01-04 DIAGNOSIS — F17200 Nicotine dependence, unspecified, uncomplicated: Secondary | ICD-10-CM | POA: Insufficient documentation

## 2013-01-04 DIAGNOSIS — R259 Unspecified abnormal involuntary movements: Secondary | ICD-10-CM | POA: Insufficient documentation

## 2013-01-04 DIAGNOSIS — Z79899 Other long term (current) drug therapy: Secondary | ICD-10-CM | POA: Insufficient documentation

## 2013-01-04 DIAGNOSIS — F1910 Other psychoactive substance abuse, uncomplicated: Secondary | ICD-10-CM

## 2013-01-04 DIAGNOSIS — F1210 Cannabis abuse, uncomplicated: Secondary | ICD-10-CM | POA: Insufficient documentation

## 2013-01-04 DIAGNOSIS — J4489 Other specified chronic obstructive pulmonary disease: Secondary | ICD-10-CM | POA: Insufficient documentation

## 2013-01-04 DIAGNOSIS — Z85819 Personal history of malignant neoplasm of unspecified site of lip, oral cavity, and pharynx: Secondary | ICD-10-CM | POA: Insufficient documentation

## 2013-01-04 DIAGNOSIS — I10 Essential (primary) hypertension: Secondary | ICD-10-CM | POA: Insufficient documentation

## 2013-01-04 DIAGNOSIS — J449 Chronic obstructive pulmonary disease, unspecified: Secondary | ICD-10-CM | POA: Insufficient documentation

## 2013-01-04 DIAGNOSIS — Z880 Allergy status to penicillin: Secondary | ICD-10-CM | POA: Insufficient documentation

## 2013-01-04 DIAGNOSIS — Z794 Long term (current) use of insulin: Secondary | ICD-10-CM | POA: Insufficient documentation

## 2013-01-04 DIAGNOSIS — R569 Unspecified convulsions: Secondary | ICD-10-CM

## 2013-01-04 DIAGNOSIS — F209 Schizophrenia, unspecified: Secondary | ICD-10-CM | POA: Insufficient documentation

## 2013-01-04 DIAGNOSIS — I2510 Atherosclerotic heart disease of native coronary artery without angina pectoris: Secondary | ICD-10-CM | POA: Insufficient documentation

## 2013-01-04 DIAGNOSIS — G40909 Epilepsy, unspecified, not intractable, without status epilepticus: Secondary | ICD-10-CM | POA: Insufficient documentation

## 2013-01-04 LAB — GLUCOSE, CAPILLARY: Glucose-Capillary: 69 mg/dL — ABNORMAL LOW (ref 70–99)

## 2013-01-04 LAB — URINALYSIS, ROUTINE W REFLEX MICROSCOPIC
Specific Gravity, Urine: 1.007 (ref 1.005–1.030)
Urobilinogen, UA: 0.2 mg/dL (ref 0.0–1.0)
pH: 7 (ref 5.0–8.0)

## 2013-01-04 LAB — BASIC METABOLIC PANEL
Calcium: 9.1 mg/dL (ref 8.4–10.5)
GFR calc Af Amer: >90 mL/min (ref >90)
GFR calc non Af Amer: >90 mL/min (ref >90)
Glucose, Bld: 104 mg/dL — ABNORMAL HIGH (ref 70–99)
Sodium: 136 mEq/L (ref 135–145)

## 2013-01-04 LAB — CBC WITH DIFFERENTIAL/PLATELET
Basophils Absolute: 0 10*3/uL (ref 0.0–0.1)
Basophils Relative: 0 % (ref 0–1)
Eosinophils Absolute: 0.1 10*3/uL (ref 0.0–0.7)
Eosinophils Relative: 1 % (ref 0–5)
MCH: 30.5 pg (ref 26.0–34.0)
MCHC: 33.7 g/dL (ref 30.0–36.0)
MCV: 90.6 fL (ref 78.0–100.0)
Neutrophils Relative %: 51 % (ref 43–77)
Platelets: 189 10*3/uL (ref 150–400)
RDW: 13.2 % (ref 11.5–15.5)

## 2013-01-04 LAB — RAPID URINE DRUG SCREEN, HOSP PERFORMED
Barbiturates: NOT DETECTED
Cocaine: NOT DETECTED
Opiates: NOT DETECTED

## 2013-01-04 LAB — URINE MICROSCOPIC-ADD ON

## 2013-01-04 MED ORDER — ACETAMINOPHEN 500 MG PO TABS
1000.0000 mg | ORAL_TABLET | Freq: Once | ORAL | Status: DC
  Filled 2013-01-04: qty 2

## 2013-01-04 MED ORDER — DIVALPROEX SODIUM 250 MG PO DR TAB
500.0000 mg | DELAYED_RELEASE_TABLET | Freq: Once | ORAL | Status: AC
  Administered 2013-01-04: 500 mg via ORAL
  Filled 2013-01-04: qty 2

## 2013-01-04 MED ORDER — HYDROCODONE-ACETAMINOPHEN 5-325 MG PO TABS
1.0000 | ORAL_TABLET | Freq: Once | ORAL | Status: AC
  Administered 2013-01-04: 1 via ORAL
  Filled 2013-01-04: qty 1

## 2013-01-04 MED ORDER — LORAZEPAM 1 MG PO TABS
1.0000 mg | ORAL_TABLET | Freq: Once | ORAL | Status: AC
  Administered 2013-01-04: 1 mg via ORAL
  Filled 2013-01-04: qty 1

## 2013-01-04 MED ORDER — DIVALPROEX SODIUM 500 MG PO DR TAB: 500.0000 mg | DELAYED_RELEASE_TABLET | Freq: Two times a day (BID) | ORAL | Status: DC

## 2013-01-04 MED ORDER — KETOROLAC TROMETHAMINE 15 MG/ML IJ SOLN
15.0000 mg | Freq: Once | INTRAMUSCULAR | Status: AC
  Administered 2013-01-04: 15 mg via INTRAVENOUS
  Filled 2013-01-04: qty 1

## 2013-01-04 NOTE — ED Provider Notes (Signed)
CSN: [id number]     Arrival date & time 01/04/13  1634 History   First MD Initiated Contact with Patient 01/04/13 1634     Chief Complaint  Patient presents with  . Seizures   (Consider location/radiation/quality/duration/timing/severity/associated sxs/prior Treatment) Patient is a 47 y.o. female presenting with seizures. The history is provided by the patient and the EMS personnel.  Seizures Seizure activity on arrival: no   Seizure type:  Grand mal Preceding symptoms comment:  Generalized numbness and feeling like she could not move her extremities Initial focality:  None Episode characteristics: abnormal movements, confusion, generalized shaking and unresponsiveness   Postictal symptoms: confusion   Severity:  Moderate Duration:  45 seconds Timing:  Once Number of seizures this episode:  1 Progression:  Resolved Context: drug use (Synthetic marijuana), emotional upset and medical non-compliance (has been off Depakote for one month.)   Context: not fever   Recent head injury:  No recent head injuries PTA treatment:  None History of seizures: yes     Past Medical History  Diagnosis Date  . Hypertension   . Seizures   . COPD (chronic obstructive pulmonary disease)   . Diabetes mellitus without complication   . Cancer throat  . Sleep apnea   . Coronary artery disease   . Schizophrenia   . Migraine    Past Surgical History  Procedure Laterality Date  . Abdominal hysterectomy    . Appendectomy    . Cholecystectomy    . Eye surgery    . Cesarean section    . Knee surgery     History reviewed. No pertinent family history. History  Substance Use Topics  . Smoking status: Current Every Day Smoker -- 0.50 packs/day for 30 years    Types: Cigarettes  . Smokeless tobacco: Never Used  . Alcohol Use: No   OB History   Grav Para Term Preterm Abortions TAB SAB Ect Mult Living                 Review of Systems  Constitutional: Negative for fever and chills.   Respiratory: Negative for apnea, cough and shortness of breath.   Cardiovascular: Negative for chest pain.  Gastrointestinal: Negative for nausea, vomiting, diarrhea and constipation.  Musculoskeletal: Negative for arthralgias and myalgias.  Skin: Negative for color change, pallor, rash and wound.  Neurological: Positive for seizures and headaches. Negative for facial asymmetry, speech difficulty, weakness, light-headedness and numbness.  All other systems reviewed and are negative.    Allergies  Bee venom; Aspirin; Other; Penicillins; and Vancomycin  Home Medications   Current Outpatient Rx  Name  Route  Sig  Dispense  Refill  . albuterol (PROVENTIL HFA;VENTOLIN HFA) 108 (90 BASE) MCG/ACT inhaler   Inhalation   Inhale 2 puffs into the lungs every 6 (six) hours as needed for wheezing or shortness of breath.          . enalapril (VASOTEC) 5 MG tablet   Oral   Take 5 mg by mouth daily.         . Insulin Glargine (LANTUS SOLOSTAR [place])   Subcutaneous   Inject 20 Units into the skin 2 (two) times daily.         . nitroGLYCERIN (NITROSTAT) 0.4 MG SL tablet   Sublingual   Place 0.4 mg under the tongue every 5 (five) minutes as needed for chest pain.         . paliperidone (INVEGA) 3 MG 24 hr tablet   Oral  Take 3 mg by mouth at bedtime.         . traZODone (DESYREL) 150 MG tablet   Oral   Take 150 mg by mouth at bedtime as needed for sleep.          . Vitamin D, Ergocalciferol, (DRISDOL) 50000 UNITS CAPS capsule   Oral   Take 50,000 Units by mouth every 7 (seven) days. monday         . divalproex (DEPAKOTE) 500 MG DR tablet   Oral   Take 1 tablet (500 mg total) by mouth 2 (two) times daily.   30 tablet   0    BP 104/84  Pulse 72  Temp(Src) 98 F (36.7 C) (Oral)  Resp 14  SpO2 99% Physical Exam  Nursing note and vitals reviewed. Constitutional: She is oriented to person, place, and time. She appears well-developed and well-nourished. No distress.   tearful  HENT:  Head: Normocephalic and atraumatic.  Mouth/Throat: Oropharynx is clear and moist. No oropharyngeal exudate.  Eyes: Conjunctivae and EOM are normal. Pupils are equal, round, and reactive to light.  Neck: Normal range of motion. Neck supple.  Cardiovascular: Normal rate, regular rhythm and normal heart sounds.  Exam reveals no gallop and no friction rub.   No murmur heard. Pulmonary/Chest: Effort normal and breath sounds normal. No respiratory distress. She has no wheezes. She has no rales. She exhibits no tenderness.  Abdominal: Soft. She exhibits no distension. There is no tenderness.  Musculoskeletal: Normal range of motion. She exhibits no edema and no tenderness.  Lymphadenopathy:    She has no cervical adenopathy.  Neurological: She is alert and oriented to person, place, and time. She has normal strength. No cranial nerve deficit or sensory deficit. GCS eye subscore is 4. GCS verbal subscore is 5. GCS motor subscore is 6. She displays no Babinski's sign on the right side. She displays no Babinski's sign on the left side.  Reflex Scores:      Patellar reflexes are 2+ on the right side and 2+ on the left side.      Achilles reflexes are 2+ on the right side and 2+ on the left side. Poor cooperation with exam, however patient has good strength in upper and lower extremities, moving all extremities  Skin: Skin is warm and dry. No rash noted. She is not diaphoretic.  Psychiatric: She has a normal mood and affect. Her behavior is normal. Judgment and thought content normal.    ED Course  Procedures (including critical care time) Labs Review Labs Reviewed  BASIC METABOLIC PANEL - Abnormal; Notable for the following:    Glucose, Bld 104 (*)    All other components within normal limits  URINALYSIS, ROUTINE W REFLEX MICROSCOPIC - Abnormal; Notable for the following:    Hgb urine dipstick SMALL (*)    All other components within normal limits  GLUCOSE, CAPILLARY -  Abnormal; Notable for the following:    Glucose-Capillary 69 (*)    All other components within normal limits  URINE MICROSCOPIC-ADD ON - Abnormal; Notable for the following:    Squamous Epithelial / LPF MANY (*)    All other components within normal limits  CBC WITH DIFFERENTIAL  URINE RAPID DRUG SCREEN (HOSP PERFORMED)  ETHANOL   Imaging Review Ct Head Wo Contrast  01/04/2013   CLINICAL DATA:  Seizure  EXAM: CT HEAD WITHOUT CONTRAST  TECHNIQUE: Contiguous axial images were obtained from the base of the skull through the vertex without intravenous contrast.  COMPARISON:  None.  FINDINGS: No acute cortical infarct, hemorrhage, or mass lesion is present. Ventricles are of normal size. No significant extra-axial fluid collection is present. The paranasal sinuses and mastoid air cells are clear. The osseous skull is intact.  IMPRESSION: 1. No acute intracranial abnormalities.  Normal brain   Electronically Signed   By: Signa Kell M.D.   On: 01/04/2013 18:27    EKG Interpretation   None       Date: 01/04/2013  Rate: 57  Rhythm: normal sinus rhythm  QRS Axis: normal  Intervals: normal  ST/T Wave abnormalities: normal  Conduction Disutrbances:none  Narrative Interpretation:   Old EKG Reviewed: unchanged   MDM   1. Seizure   2. Drug abuse     47 year old female with a history of type 2 diabetes, bipolar disorder, seizure disorder previously on Depakote who presents with a seizure. Patient was smoking synthetic marijuana prior to arrival, had an episode where she felt like she could not move in her entire body was normal and lifeless. For that, she called EMS. On EMS arrival, patient was moving all extremities complaining of this consultation. She then proceeded to have a 45 second generalized which terminated without medical intervention. Vital signs stable afebrile and blood glucose 101.  Patient is tearful on exam and crying for her mother. She complains of not being able to  move any of her extremities, however when examining for Babinski reflex she jerks her lack of bed. She also moves her arm to avoid hitting her face when lifting above her head. She has no focal neurologic deficits. It is possible this is side effects from her synthetic marijuana use, otherwise the patient could also be postictal period, electrolyte abnormalities. Will evaluate with EKG, laboratory workup, Depakote level, urinalysis, urine drug screen. We'll continue to monitor patient for clearing of mental status. She is currently alert and oriented x3 with apropriate responses despite her tearfulness. Ativan for seizure prophylaxis and tylenol for headache.  Urinalysis without UTI. UDS screen negative. No leukocytosis or anemia. No significant lyte abnormalities. Alcohol level normal. Feel that drug use is most likely culprit for patient's seizure in this patient with a known seizure disorder. CT head without fracture, intracranial hemorrhage. Headache not resolve with Tylenol, Toradol, however significantly improved with one Norco. Patient remained seizure-free during emerge stay. She was loaded with Depakote and discharged on the same. She is to follow up with her neurologist for further management of her seizure disorder. Ambulated without difficulty prior to discharge and patient was discharged home with her friend, a homeless advocate.  This patient was discussed with my attending, Dr. Fayrene Fearing.  Dorna Leitz, MD 01/05/13 [phone]

## 2013-01-04 NOTE — ED Notes (Signed)
Pt in via EMS, per EMS- pt in after witnessed seizure, pt smoked K2 at 1555 and states she took two puffs, pt then had a seizure that lasted approx 45 seconds, pt now states that she feels numb all over, normal movement noted. Pt with history of seizures but does not take medication at this time. IV started PTA, 20g in LAC, CBG 101, BP 160/100 HR 60 100% O2 on RA

## 2013-01-04 NOTE — ED Notes (Signed)
Pt requesting pain medication stating she has a migraine, pt reports pain medication did not help her leg pain, she is having feeling returning to her legs. Later pt also begins to complain that her back is "spasming" - pt then had a visitor and pt became acutely tearful, when pt's visitor left the room, pt lying quietly in her bed. Pt A&Ox4

## 2013-01-05 NOTE — ED Provider Notes (Addendum)
Patient seen and examined. She sleeping and comfortable. No additional seizure activity. She is homeless. One of her advocates is here willing to take her with her tonight. She was loaded with IV Depakote. Her advocates states she does have resources to fill and take her seizure medications.  I saw and evaluated the patient, reviewed the resident's note and I agree with the findings and plan.  I agree with Resident EKG interpretation.   Roney Marion, MD 01/05/13 1354  Roney Marion, MD 01/13/13 [phone]

## 2013-01-11 ENCOUNTER — Encounter (HOSPITAL_COMMUNITY): Payer: Self-pay | Admitting: Emergency Medicine

## 2013-01-11 ENCOUNTER — Emergency Department (HOSPITAL_COMMUNITY): Payer: Medicaid Other

## 2013-01-11 ENCOUNTER — Emergency Department (HOSPITAL_COMMUNITY)
Admission: EM | Admit: 2013-01-11 | Discharge: 2013-01-11 | Disposition: A | Discharge status: Home / Self Care | Payer: Medicaid Other | Attending: Emergency Medicine | Admitting: Emergency Medicine

## 2013-01-11 DIAGNOSIS — G43909 Migraine, unspecified, not intractable, without status migrainosus: Secondary | ICD-10-CM | POA: Insufficient documentation

## 2013-01-11 DIAGNOSIS — J4489 Other specified chronic obstructive pulmonary disease: Secondary | ICD-10-CM | POA: Insufficient documentation

## 2013-01-11 DIAGNOSIS — M79609 Pain in unspecified limb: Secondary | ICD-10-CM | POA: Insufficient documentation

## 2013-01-11 DIAGNOSIS — M2550 Pain in unspecified joint: Secondary | ICD-10-CM | POA: Insufficient documentation

## 2013-01-11 DIAGNOSIS — F209 Schizophrenia, unspecified: Secondary | ICD-10-CM | POA: Insufficient documentation

## 2013-01-11 DIAGNOSIS — F17200 Nicotine dependence, unspecified, uncomplicated: Secondary | ICD-10-CM | POA: Insufficient documentation

## 2013-01-11 DIAGNOSIS — I2510 Atherosclerotic heart disease of native coronary artery without angina pectoris: Secondary | ICD-10-CM | POA: Insufficient documentation

## 2013-01-11 DIAGNOSIS — R071 Chest pain on breathing: Secondary | ICD-10-CM | POA: Insufficient documentation

## 2013-01-11 DIAGNOSIS — J449 Chronic obstructive pulmonary disease, unspecified: Secondary | ICD-10-CM | POA: Insufficient documentation

## 2013-01-11 DIAGNOSIS — Z880 Allergy status to penicillin: Secondary | ICD-10-CM | POA: Insufficient documentation

## 2013-01-11 DIAGNOSIS — M549 Dorsalgia, unspecified: Secondary | ICD-10-CM

## 2013-01-11 DIAGNOSIS — Z79899 Other long term (current) drug therapy: Secondary | ICD-10-CM | POA: Insufficient documentation

## 2013-01-11 DIAGNOSIS — E119 Type 2 diabetes mellitus without complications: Secondary | ICD-10-CM | POA: Insufficient documentation

## 2013-01-11 DIAGNOSIS — Z794 Long term (current) use of insulin: Secondary | ICD-10-CM | POA: Insufficient documentation

## 2013-01-11 DIAGNOSIS — G40909 Epilepsy, unspecified, not intractable, without status epilepticus: Secondary | ICD-10-CM | POA: Insufficient documentation

## 2013-01-11 DIAGNOSIS — M546 Pain in thoracic spine: Secondary | ICD-10-CM | POA: Insufficient documentation

## 2013-01-11 DIAGNOSIS — Z859 Personal history of malignant neoplasm, unspecified: Secondary | ICD-10-CM | POA: Insufficient documentation

## 2013-01-11 DIAGNOSIS — R0789 Other chest pain: Secondary | ICD-10-CM

## 2013-01-11 DIAGNOSIS — G4730 Sleep apnea, unspecified: Secondary | ICD-10-CM | POA: Insufficient documentation

## 2013-01-11 DIAGNOSIS — I10 Essential (primary) hypertension: Secondary | ICD-10-CM | POA: Insufficient documentation

## 2013-01-11 LAB — BASIC METABOLIC PANEL
Calcium: 9.6 mg/dL (ref 8.4–10.5)
GFR calc non Af Amer: >90 mL/min (ref >90)
Sodium: 137 mEq/L (ref 135–145)

## 2013-01-11 LAB — CBC
MCH: 30.8 pg (ref 26.0–34.0)
MCHC: 33.7 g/dL (ref 30.0–36.0)
Platelets: 183 10*3/uL (ref 150–400)
RBC: 4.38 MIL/uL (ref 3.87–5.11)

## 2013-01-11 LAB — TROPONIN I: Troponin I: <0.3 ng/mL (ref <0.30)

## 2013-01-11 LAB — VALPROIC ACID LEVEL: Valproic Acid Lvl: <10 ug/mL — ABNORMAL LOW (ref 50.0–100.0)

## 2013-01-11 MED ORDER — MORPHINE SULFATE 4 MG/ML IJ SOLN
6.0000 mg | Freq: Once | INTRAMUSCULAR | Status: AC
  Administered 2013-01-11: 6 mg via INTRAVENOUS
  Filled 2013-01-11: qty 2

## 2013-01-11 NOTE — ED Notes (Signed)
Bed: ZO10 Expected date:  Expected time:  Means of arrival:  Comments: Lt arm pain/elderly

## 2013-01-11 NOTE — ED Provider Notes (Signed)
CSN: [id number]     Arrival date & time 01/11/13  1122 History   First MD Initiated Contact with Patient 01/11/13 1126     Chief Complaint  Patient presents with  . Chest Pain  . Arm Pain   (Consider location/radiation/quality/duration/timing/severity/associated sxs/prior Treatment) HPI Comments: 47 yo female with with bipolar, copd, ? CAD (pt says she refused heart eval in the past) presents with left posterior shoulder and left upper chest pain with mild radiation down left arm. Pt possibly had seizure activity prior to arrival, seizure hx.   No exertional or diaphoresis.  Constant muscle ache since last night, worse with movement.  No known cardiac hx.  Pt can hardly move left shoulder due to pain with movement.  No injury.    Patient is a 47 y.o. female presenting with chest pain and arm pain. The history is provided by the patient.  Chest Pain Associated symptoms: no abdominal pain, no back pain, no cough, no fever, no headache, no shortness of breath and not vomiting   Arm Pain Associated symptoms include chest pain. Pertinent negatives include no abdominal pain, no headaches and no shortness of breath.    Past Medical History  Diagnosis Date  . Hypertension   . Seizures   . COPD (chronic obstructive pulmonary disease)   . Diabetes mellitus without complication   . Cancer throat  . Sleep apnea   . Coronary artery disease   . Schizophrenia   . Migraine    Past Surgical History  Procedure Laterality Date  . Abdominal hysterectomy    . Appendectomy    . Cholecystectomy    . Eye surgery    . Cesarean section    . Knee surgery     No family history on file. History  Substance Use Topics  . Smoking status: Current Every Day Smoker -- 0.50 packs/day for 30 years    Types: Cigarettes  . Smokeless tobacco: Never Used  . Alcohol Use: No   OB History   Grav Para Term Preterm Abortions TAB SAB Ect Mult Living                 Review of Systems  Constitutional: Negative  for fever and chills.  Eyes: Negative for visual disturbance.  Respiratory: Negative for cough and shortness of breath.   Cardiovascular: Positive for chest pain.  Gastrointestinal: Negative for vomiting and abdominal pain.  Genitourinary: Negative for dysuria and flank pain.  Musculoskeletal: Positive for arthralgias. Negative for back pain, neck pain and neck stiffness.  Skin: Negative for rash.  Neurological: Positive for seizures. Negative for light-headedness and headaches.    Allergies  Bee venom; Aspirin; Other; Penicillins; and Vancomycin  Home Medications   Current Outpatient Rx  Name  Route  Sig  Dispense  Refill  . divalproex (DEPAKOTE) 500 MG DR tablet   Oral   Take 500 mg by mouth 2 (two) times daily.         . enalapril (VASOTEC) 5 MG tablet   Oral   Take 5 mg by mouth daily.         . Insulin Glargine (LANTUS SOLOSTAR [place])   Subcutaneous   Inject 20 Units into the skin 2 (two) times daily.         . paliperidone (INVEGA) 3 MG 24 hr tablet   Oral   Take 3 mg by mouth at bedtime.         . traZODone (DESYREL) 150 MG tablet   Oral  Take 150 mg by mouth at bedtime as needed for sleep.          . Vitamin D, Ergocalciferol, (DRISDOL) 50000 UNITS CAPS capsule   Oral   Take 50,000 Units by mouth every 7 (seven) days. monday         . albuterol (PROVENTIL HFA;VENTOLIN HFA) 108 (90 BASE) MCG/ACT inhaler   Inhalation   Inhale 2 puffs into the lungs every 6 (six) hours as needed for wheezing or shortness of breath.          . nitroGLYCERIN (NITROSTAT) 0.4 MG SL tablet   Sublingual   Place 0.4 mg under the tongue every 5 (five) minutes as needed for chest pain.          BP 115/69  Pulse 65  Temp(Src) 98.2 F (36.8 C) (Oral)  Ht 5\' 2"  (1.575 m)  SpO2 99% Physical Exam  Nursing note and vitals reviewed. Constitutional: She is oriented to person, place, and time. She appears well-developed and well-nourished.  HENT:  Head: Normocephalic and  atraumatic.  Eyes: Conjunctivae are normal. Right eye exhibits no discharge. Left eye exhibits no discharge.  Neck: Normal range of motion. Neck supple. No tracheal deviation present.  Cardiovascular: Normal rate, regular rhythm and intact distal pulses.   Pulmonary/Chest: Effort normal and breath sounds normal.  Abdominal: Soft. She exhibits no distension. There is no tenderness. There is no guarding.  Musculoskeletal: She exhibits tenderness (left scapular and left paraspinal thoracic musculature, very tender to any movement of left shoudler, no tendenness to left AC or shoulder joint, nv intact left arm, no neck pain full rom). She exhibits no edema.  Neurological: She is alert and oriented to person, place, and time. No cranial nerve deficit.  Skin: Skin is warm. No rash noted.  Psychiatric: She has a normal mood and affect.    ED Course  Procedures (including critical care time) Labs Review Labs Reviewed  BASIC METABOLIC PANEL - Abnormal; Notable for the following:    Glucose, Bld 110 (*)    All other components within normal limits  VALPROIC ACID LEVEL - Abnormal; Notable for the following:    Valproic Acid Lvl <10.0 (*)    All other components within normal limits  CBC  TROPONIN I   Imaging Review No results found.  EKG Interpretation   None       MDM  No diagnosis found. Concern for MSK for cp possibly from brief seizure. CXR and L shoulder xray, pain medicines.  Cardiac screen with possible cardiac hx, pt unable to give details.   Pain resolved in ED, mild with movement. No seizure activity in ED. WEell appearing.  Seizure medicine low.  Discussed taking extra dose the next two days.  Rechecked, tolerated meal, discussed close outpt fup. Pain resolved.  Results and differential diagnosis were discussed with the patient. Discussed taking seizure meds appropriate and taking extra dose the next two days. Close follow up outpatient was discussed, patient  comfortable with the plan.   Diagnosis: Acute upper back strain, Seizure, Chest wall pain     Enid Skeens, MD 01/12/13 [phone]

## 2013-01-11 NOTE — ED Notes (Addendum)
Per EMS, EMS responded to seizure call. Pt was not seizing when EMS arrived, rehab center reported left arm was shaking. Pt complains of pain in left arm when being moved.  Pt A&OX4.

## 2013-01-29 ENCOUNTER — Emergency Department (HOSPITAL_COMMUNITY)
Admission: EM | Admit: 2013-01-29 | Discharge: 2013-01-30 | Disposition: A | Discharge status: Home / Self Care | Payer: Medicaid Other | Attending: Emergency Medicine | Admitting: Emergency Medicine

## 2013-01-29 ENCOUNTER — Encounter (HOSPITAL_COMMUNITY): Payer: Self-pay | Admitting: Emergency Medicine

## 2013-01-29 DIAGNOSIS — Z880 Allergy status to penicillin: Secondary | ICD-10-CM | POA: Insufficient documentation

## 2013-01-29 DIAGNOSIS — J4489 Other specified chronic obstructive pulmonary disease: Secondary | ICD-10-CM | POA: Insufficient documentation

## 2013-01-29 DIAGNOSIS — Z79899 Other long term (current) drug therapy: Secondary | ICD-10-CM | POA: Insufficient documentation

## 2013-01-29 DIAGNOSIS — Z859 Personal history of malignant neoplasm, unspecified: Secondary | ICD-10-CM | POA: Insufficient documentation

## 2013-01-29 DIAGNOSIS — R569 Unspecified convulsions: Secondary | ICD-10-CM | POA: Insufficient documentation

## 2013-01-29 DIAGNOSIS — I2510 Atherosclerotic heart disease of native coronary artery without angina pectoris: Secondary | ICD-10-CM | POA: Insufficient documentation

## 2013-01-29 DIAGNOSIS — M542 Cervicalgia: Secondary | ICD-10-CM | POA: Insufficient documentation

## 2013-01-29 DIAGNOSIS — E119 Type 2 diabetes mellitus without complications: Secondary | ICD-10-CM | POA: Insufficient documentation

## 2013-01-29 DIAGNOSIS — I10 Essential (primary) hypertension: Secondary | ICD-10-CM | POA: Insufficient documentation

## 2013-01-29 DIAGNOSIS — F17200 Nicotine dependence, unspecified, uncomplicated: Secondary | ICD-10-CM | POA: Insufficient documentation

## 2013-01-29 DIAGNOSIS — J449 Chronic obstructive pulmonary disease, unspecified: Secondary | ICD-10-CM | POA: Insufficient documentation

## 2013-01-29 DIAGNOSIS — Z8659 Personal history of other mental and behavioral disorders: Secondary | ICD-10-CM | POA: Insufficient documentation

## 2013-01-29 DIAGNOSIS — Z794 Long term (current) use of insulin: Secondary | ICD-10-CM | POA: Insufficient documentation

## 2013-01-29 LAB — GLUCOSE, CAPILLARY: Glucose-Capillary: 103 mg/dL — ABNORMAL HIGH (ref 70–99)

## 2013-01-29 LAB — CBC
Hemoglobin: 13.1 g/dL (ref 12.0–15.0)
MCH: 31.3 pg (ref 26.0–34.0)
MCHC: 33.9 g/dL (ref 30.0–36.0)
MCV: 92.1 fL (ref 78.0–100.0)
Platelets: 172 10*3/uL (ref 150–400)
RBC: 4.19 MIL/uL (ref 3.87–5.11)

## 2013-01-29 MED ORDER — SODIUM CHLORIDE 0.9 % IV BOLUS (SEPSIS)
1000.0000 mL | Freq: Once | INTRAVENOUS | Status: AC
  Administered 2013-01-29: 1000 mL via INTRAVENOUS

## 2013-01-29 NOTE — ED Notes (Signed)
Per EMS: Patient and significant other [place]. Friends called EMS because of possible seizure. EMS reports after seizure pts speech was slurred. No other stroke like symptoms assessed. Pt does c/o R sided neck pain when palpated. Pt has full ROM in all extremities, equal grip strength bilaterally. No facial asymmetry noted. Pt does report migraine and reports she does have a sensitivity to the overhead light. Pt ax4, NAD at this time.

## 2013-01-29 NOTE — ED Provider Notes (Signed)
CSN: [id number]     Arrival date & time 01/29/13  2318 History   First MD Initiated Contact with Patient 01/29/13 2333     Chief Complaint  Patient presents with  . Seizures   (Consider location/radiation/quality/duration/timing/severity/associated sxs/prior Treatment) HPI 47 yo woman with  HTN, seizure disorder, copd, IDDM T2, schizophrenia, history of migraine headaches and questionable history of CAD.  She presents from home after experiencing two seizures. Both witnessed by her significant other. First lasted < 1 m. Second lasted approx 3-68m. Both characterized as "grand mal". Patient did not fall out of bed. She is back to normal MS.   She reports compliance with all meds. The patient complains of right sided neck pain which began after seizure. Pain is aching, worse with rotation of the neck and worse with speaking.   Past Medical History  Diagnosis Date  . Hypertension   . Seizures   . COPD (chronic obstructive pulmonary disease)   . Diabetes mellitus without complication   . Cancer throat  . Sleep apnea   . Coronary artery disease   . Schizophrenia   . Migraine    Past Surgical History  Procedure Laterality Date  . Abdominal hysterectomy    . Appendectomy    . Cholecystectomy    . Eye surgery    . Cesarean section    . Knee surgery     No family history on file. History  Substance Use Topics  . Smoking status: Current Every Day Smoker -- 0.50 packs/day for 30 years    Types: Cigarettes  . Smokeless tobacco: Never Used  . Alcohol Use: No   OB History   Grav Para Term Preterm Abortions TAB SAB Ect Mult Living                 Review of Systems10 point ROS obtained and is negative with the exception of sx noted above.   Allergies  Bee venom; Aspirin; Other; Penicillins; and Vancomycin  Home Medications   Current Outpatient Rx  Name  Route  Sig  Dispense  Refill  . albuterol (PROVENTIL HFA;VENTOLIN HFA) 108 (90 BASE) MCG/ACT inhaler   Inhalation   Inhale 2  puffs into the lungs every 6 (six) hours as needed for wheezing or shortness of breath.          . divalproex (DEPAKOTE) 500 MG DR tablet   Oral   Take 500 mg by mouth 2 (two) times daily.         . enalapril (VASOTEC) 5 MG tablet   Oral   Take 5 mg by mouth daily.         . Insulin Glargine (LANTUS SOLOSTAR [place])   Subcutaneous   Inject 20 Units into the skin 2 (two) times daily.         . nitroGLYCERIN (NITROSTAT) 0.4 MG SL tablet   Sublingual   Place 0.4 mg under the tongue every 5 (five) minutes as needed for chest pain.         . paliperidone (INVEGA) 3 MG 24 hr tablet   Oral   Take 3 mg by mouth at bedtime.         . traZODone (DESYREL) 150 MG tablet   Oral   Take 150 mg by mouth at bedtime as needed for sleep.          . Vitamin D, Ergocalciferol, (DRISDOL) 50000 UNITS CAPS capsule   Oral   Take 50,000 Units by mouth every 7 (seven) days.  monday          BP 128/73  Temp(Src) 99.3 F (37.4 C) (Oral)  Resp 12  SpO2 96% Physical Exam Gen: well developed and well nourished appearing Head: NCAT Eyes: PERL, EOMI Nose: no epistaixis or rhinorrhea Mouth/throat: mucosa is moist and pink, mouth is edentulous, uvula midline, no masses observed Neck: no c spine ttp, ttp over right paraspinal muscualture and right anterior cervical musculature, no torticollis apprecated, no lan, trachea is midline Lungs: CTA B, no wheezing, rhonchi or rales Chest wall: no ttp CV: RRR, no murmur Abd: soft, notender, nondistended Back: no ttp, no cva ttp Skin: warm and dry, intact Neuro: CN ii-xii grossly intact, no focal deficits Psyche; normal affect,  calm and cooperative.   ED Course  Procedures (including critical care time) Labs Review  Results for orders placed during the hospital encounter of 01/29/13 (from the past 24 hour(s))  VALPROIC ACID LEVEL     Status: Abnormal   Collection Time    01/29/13 11:45 PM      Result Value Range   Valproic Acid Lvl <10.0  (*) 50.0 - 100.0 ug/mL  COMPREHENSIVE METABOLIC PANEL     Status: Abnormal   Collection Time    01/29/13 11:45 PM      Result Value Range   Sodium 136  135 - 145 mEq/L   Potassium 3.6  3.5 - 5.1 mEq/L   Chloride 100  96 - 112 mEq/L   CO2 26  19 - 32 mEq/L   Glucose, Bld 113 (*) 70 - 99 mg/dL   BUN 9  6 - 23 mg/dL   Creatinine, Ser 4.09  0.50 - 1.10 mg/dL   Calcium 9.4  8.4 - 81.1 mg/dL   Total Protein 6.7  6.0 - 8.3 g/dL   Albumin 3.6  3.5 - 5.2 g/dL   AST 14  0 - 37 U/L   ALT 5  0 - 35 U/L   Alkaline Phosphatase 98  39 - 117 U/L   Total Bilirubin 0.4  0.3 - 1.2 mg/dL   GFR calc non Af Amer >90  >90 mL/min   GFR calc Af Amer >90  >90 mL/min  CBC     Status: Abnormal   Collection Time    01/29/13 11:45 PM      Result Value Range   WBC 13.4 (*) 4.0 - 10.5 K/uL   RBC 4.19  3.87 - 5.11 MIL/uL   Hemoglobin 13.1  12.0 - 15.0 g/dL   HCT 91.4  78.2 - 95.6 %   MCV 92.1  78.0 - 100.0 fL   MCH 31.3  26.0 - 34.0 pg   MCHC 33.9  30.0 - 36.0 g/dL   RDW 21.3  08.6 - 57.8 %   Platelets 172  150 - 400 K/uL  GLUCOSE, CAPILLARY     Status: Abnormal   Collection Time    01/29/13 11:57 PM      Result Value Range   Glucose-Capillary 103 (*) 70 - 99 mg/dL  URINE RAPID DRUG SCREEN (HOSP PERFORMED)     Status: None   Collection Time    01/30/13 12:00 AM      Result Value Range   Opiates NONE DETECTED  NONE DETECTED   Cocaine NONE DETECTED  NONE DETECTED   Benzodiazepines NONE DETECTED  NONE DETECTED   Amphetamines NONE DETECTED  NONE DETECTED   Tetrahydrocannabinol NONE DETECTED  NONE DETECTED   Barbiturates NONE DETECTED  NONE DETECTED  URINALYSIS, ROUTINE  W REFLEX MICROSCOPIC     Status: Abnormal   Collection Time    01/30/13 12:00 AM      Result Value Range   Color, Urine YELLOW  YELLOW   APPearance CLOUDY (*) CLEAR   Specific Gravity, Urine 1.004 (*) 1.005 - 1.030   pH 6.0  5.0 - 8.0   Glucose, UA NEGATIVE  NEGATIVE mg/dL   Hgb urine dipstick SMALL (*) NEGATIVE   Bilirubin Urine  NEGATIVE  NEGATIVE   Ketones, ur NEGATIVE  NEGATIVE mg/dL   Protein, ur NEGATIVE  NEGATIVE mg/dL   Urobilinogen, UA 1.0  0.0 - 1.0 mg/dL   Nitrite NEGATIVE  NEGATIVE   Leukocytes, UA NEGATIVE  NEGATIVE  URINE MICROSCOPIC-ADD ON     Status: Abnormal   Collection Time    01/30/13 12:00 AM      Result Value Range   Squamous Epithelial / LPF FEW (*) RARE   WBC, UA 0-2  <3 WBC/hpf   RBC / HPF 3-6  <3 RBC/hpf   Bacteria, UA RARE  RARE    EKG: NSR, rate 67, normal axis, normal intervals, normal QRS, normal ST T segments.   MDM  Patient status post seizure. Levels of AED, VPA, undetectable. Query non-compliance.  We will load with oral depakote and observe.     Brandt Loosen, MD 01/30/13 [phone]

## 2013-01-30 ENCOUNTER — Encounter (HOSPITAL_COMMUNITY): Payer: Self-pay | Admitting: Emergency Medicine

## 2013-01-30 ENCOUNTER — Emergency Department (HOSPITAL_COMMUNITY)
Admission: EM | Admit: 2013-01-30 | Discharge: 2013-01-31 | Disposition: A | Discharge status: Home / Self Care | Payer: Medicaid Other | Source: Home / Self Care | Attending: Emergency Medicine | Admitting: Emergency Medicine

## 2013-01-30 ENCOUNTER — Emergency Department (HOSPITAL_COMMUNITY): Payer: Medicaid Other

## 2013-01-30 DIAGNOSIS — M6281 Muscle weakness (generalized): Secondary | ICD-10-CM | POA: Insufficient documentation

## 2013-01-30 DIAGNOSIS — E119 Type 2 diabetes mellitus without complications: Secondary | ICD-10-CM | POA: Insufficient documentation

## 2013-01-30 DIAGNOSIS — Z8589 Personal history of malignant neoplasm of other organs and systems: Secondary | ICD-10-CM | POA: Insufficient documentation

## 2013-01-30 DIAGNOSIS — G4730 Sleep apnea, unspecified: Secondary | ICD-10-CM | POA: Insufficient documentation

## 2013-01-30 DIAGNOSIS — J449 Chronic obstructive pulmonary disease, unspecified: Secondary | ICD-10-CM | POA: Insufficient documentation

## 2013-01-30 DIAGNOSIS — F449 Dissociative and conversion disorder, unspecified: Secondary | ICD-10-CM | POA: Insufficient documentation

## 2013-01-30 DIAGNOSIS — Z880 Allergy status to penicillin: Secondary | ICD-10-CM | POA: Insufficient documentation

## 2013-01-30 DIAGNOSIS — I2510 Atherosclerotic heart disease of native coronary artery without angina pectoris: Secondary | ICD-10-CM | POA: Insufficient documentation

## 2013-01-30 DIAGNOSIS — Z888 Allergy status to other drugs, medicaments and biological substances status: Secondary | ICD-10-CM | POA: Insufficient documentation

## 2013-01-30 DIAGNOSIS — R569 Unspecified convulsions: Secondary | ICD-10-CM | POA: Insufficient documentation

## 2013-01-30 DIAGNOSIS — Z8669 Personal history of other diseases of the nervous system and sense organs: Secondary | ICD-10-CM | POA: Insufficient documentation

## 2013-01-30 DIAGNOSIS — R4789 Other speech disturbances: Secondary | ICD-10-CM | POA: Insufficient documentation

## 2013-01-30 DIAGNOSIS — Z881 Allergy status to other antibiotic agents status: Secondary | ICD-10-CM | POA: Insufficient documentation

## 2013-01-30 DIAGNOSIS — M542 Cervicalgia: Secondary | ICD-10-CM | POA: Insufficient documentation

## 2013-01-30 DIAGNOSIS — R51 Headache: Secondary | ICD-10-CM | POA: Insufficient documentation

## 2013-01-30 DIAGNOSIS — F209 Schizophrenia, unspecified: Secondary | ICD-10-CM | POA: Insufficient documentation

## 2013-01-30 DIAGNOSIS — Z79899 Other long term (current) drug therapy: Secondary | ICD-10-CM | POA: Insufficient documentation

## 2013-01-30 DIAGNOSIS — F17200 Nicotine dependence, unspecified, uncomplicated: Secondary | ICD-10-CM | POA: Insufficient documentation

## 2013-01-30 DIAGNOSIS — F447 Conversion disorder with mixed symptom presentation: Secondary | ICD-10-CM

## 2013-01-30 DIAGNOSIS — J4489 Other specified chronic obstructive pulmonary disease: Secondary | ICD-10-CM | POA: Insufficient documentation

## 2013-01-30 LAB — VALPROIC ACID LEVEL
Valproic Acid Lvl: 27.8 ug/mL — ABNORMAL LOW (ref 50.0–100.0)
Valproic Acid Lvl: <10 ug/mL — ABNORMAL LOW (ref 50.0–100.0)

## 2013-01-30 LAB — COMPREHENSIVE METABOLIC PANEL
ALT: 15 U/L (ref 0–35)
ALT: 5 U/L (ref 0–35)
AST: 14 U/L (ref 0–37)
AST: 31 U/L (ref 0–37)
Albumin: 3.5 g/dL (ref 3.5–5.2)
Albumin: 3.6 g/dL (ref 3.5–5.2)
Alkaline Phosphatase: 148 U/L — ABNORMAL HIGH (ref 39–117)
BUN: 9 mg/dL (ref 6–23)
CO2: 26 mEq/L (ref 19–32)
Chloride: 100 mEq/L (ref 96–112)
Chloride: 104 mEq/L (ref 96–112)
Creatinine, Ser: 0.65 mg/dL (ref 0.50–1.10)
GFR calc non Af Amer: >90 mL/min (ref >90)
Potassium: 3.7 mEq/L (ref 3.5–5.1)
Sodium: 136 mEq/L (ref 135–145)
Sodium: 141 mEq/L (ref 135–145)
Total Bilirubin: 0.3 mg/dL (ref 0.3–1.2)
Total Bilirubin: 0.4 mg/dL (ref 0.3–1.2)

## 2013-01-30 LAB — POCT I-STAT, CHEM 8
BUN: 8 mg/dL (ref 6–23)
Calcium, Ion: 1.25 mmol/L — ABNORMAL HIGH (ref 1.12–1.23)
Glucose, Bld: 116 mg/dL — ABNORMAL HIGH (ref 70–99)
HCT: 41 % (ref 36.0–46.0)
Potassium: 3.5 mEq/L (ref 3.5–5.1)
TCO2: 27 mmol/L (ref 0–100)

## 2013-01-30 LAB — GLUCOSE, CAPILLARY

## 2013-01-30 LAB — DIFFERENTIAL
Basophils Absolute: 0 10*3/uL (ref 0.0–0.1)
Basophils Relative: 0 % (ref 0–1)
Eosinophils Relative: 1 % (ref 0–5)
Monocytes Relative: 7 % (ref 3–12)
Neutro Abs: 6.6 10*3/uL (ref 1.7–7.7)
Neutrophils Relative %: 64 % (ref 43–77)

## 2013-01-30 LAB — URINALYSIS, ROUTINE W REFLEX MICROSCOPIC
Bilirubin Urine: NEGATIVE
Glucose, UA: NEGATIVE mg/dL
Ketones, ur: NEGATIVE mg/dL
Leukocytes, UA: NEGATIVE
Leukocytes, UA: NEGATIVE
Nitrite: NEGATIVE
Protein, ur: NEGATIVE mg/dL
Specific Gravity, Urine: 1.004 — ABNORMAL LOW (ref 1.005–1.030)
Specific Gravity, Urine: 1.024 (ref 1.005–1.030)
Urobilinogen, UA: 1 mg/dL (ref 0.0–1.0)
pH: 6 (ref 5.0–8.0)

## 2013-01-30 LAB — RAPID URINE DRUG SCREEN, HOSP PERFORMED
Amphetamines: NOT DETECTED
Barbiturates: NOT DETECTED
Benzodiazepines: NOT DETECTED
Tetrahydrocannabinol: NOT DETECTED

## 2013-01-30 LAB — URINE MICROSCOPIC-ADD ON

## 2013-01-30 LAB — CBC
Hemoglobin: 13.1 g/dL (ref 12.0–15.0)
MCHC: 34.3 g/dL (ref 30.0–36.0)

## 2013-01-30 LAB — POCT I-STAT TROPONIN I: Troponin i, poc: 0 ng/mL (ref 0.00–0.08)

## 2013-01-30 LAB — PROTIME-INR: INR: 0.9 (ref 0.00–1.49)

## 2013-01-30 LAB — APTT: aPTT: 33 seconds (ref 24–37)

## 2013-01-30 MED ORDER — METOCLOPRAMIDE HCL 5 MG/ML IJ SOLN
10.0000 mg | Freq: Once | INTRAMUSCULAR | Status: AC
  Administered 2013-01-30: 10 mg via INTRAVENOUS
  Filled 2013-01-30: qty 2

## 2013-01-30 MED ORDER — MORPHINE SULFATE 4 MG/ML IJ SOLN
4.0000 mg | Freq: Once | INTRAMUSCULAR | Status: AC
  Administered 2013-01-30: 4 mg via INTRAVENOUS
  Filled 2013-01-30: qty 1

## 2013-01-30 MED ORDER — DIVALPROEX SODIUM 250 MG PO DR TAB
500.0000 mg | DELAYED_RELEASE_TABLET | Freq: Two times a day (BID) | ORAL | Status: DC
  Administered 2013-01-30: 500 mg via ORAL
  Filled 2013-01-30: qty 2

## 2013-01-30 NOTE — ED Provider Notes (Signed)
CSN: [id number]     Arrival date & time 01/30/13  1810 History   First MD Initiated Contact with Patient 01/30/13 1819     Chief Complaint  Patient presents with  . Aphasia    HPI Patient presents to emergency room with complaints of slurred speech, weakness, headache with concerns of a stroke. Patient states yesterday she was seen in the emergency room after reportedly having a seizure. Patient does have history of seizure disorder. The seizures were witnessed by her significant other last evening and lasted for a few minutes at a time the patient was observed in the emergency department and returned to her baseline. She was given doses of her antiseizure medications. A friend saw the patient this afternoon at about 2 PM. Friend states the patient was having difficulty with slurred speech and she felt that this was similar to another family member who had a stroke. Patient complains of headache. She complains of weakness in all extremities. She also has pain in her neck. There has been no known trauma.  Past Medical History  Diagnosis Date  . Hypertension   . Seizures   . COPD (chronic obstructive pulmonary disease)   . Diabetes mellitus without complication   . Cancer throat  . Sleep apnea   . Coronary artery disease   . Schizophrenia   . Migraine    Past Surgical History  Procedure Laterality Date  . Abdominal hysterectomy    . Appendectomy    . Cholecystectomy    . Eye surgery    . Cesarean section    . Knee surgery     No family history on file. History  Substance Use Topics  . Smoking status: Current Every Day Smoker -- 0.50 packs/day for 30 years    Types: Cigarettes  . Smokeless tobacco: Never Used  . Alcohol Use: No   OB History   Grav Para Term Preterm Abortions TAB SAB Ect Mult Living                 Review of Systems  All other systems reviewed and are negative.    Allergies  Bee venom; Aspirin; Other; Penicillins; and Vancomycin  Home Medications    Current Outpatient Rx  Name  Route  Sig  Dispense  Refill  . divalproex (DEPAKOTE) 500 MG DR tablet   Oral   Take 500 mg by mouth 2 (two) times daily.         . nitroGLYCERIN (NITROSTAT) 0.4 MG SL tablet   Sublingual   Place 0.4 mg under the tongue every 5 (five) minutes as needed for chest pain.         . paliperidone (INVEGA) 3 MG 24 hr tablet   Oral   Take 3 mg by mouth at bedtime.         . Vitamin D, Ergocalciferol, (DRISDOL) 50000 UNITS CAPS capsule   Oral   Take 50,000 Units by mouth every 7 (seven) days. Takes on Monday          BP 105/58  Pulse 59  Temp(Src) 99.5 F (37.5 C) (Oral)  Resp 18  SpO2 98% Physical Exam  Nursing note and vitals reviewed. Constitutional: She appears well-developed and well-nourished. No distress.  HENT:  Head: Normocephalic and atraumatic.  Right Ear: External ear normal.  Left Ear: External ear normal.  Mouth/Throat: Oropharynx is clear and moist.  Eyes: Conjunctivae are normal. Right eye exhibits no discharge. Left eye exhibits no discharge. No scleral icterus.  Neck:  Neck supple. Tracheal tenderness and muscular tenderness present. No spinous process tenderness present. No tracheal deviation present.  No masses  Cardiovascular: Normal rate, regular rhythm and intact distal pulses.   Pulmonary/Chest: Effort normal and breath sounds normal. No stridor. No respiratory distress. She has no wheezes. She has no rales.  Abdominal: Soft. Bowel sounds are normal. She exhibits no distension. There is no tenderness. There is no rebound and no guarding.  Musculoskeletal: She exhibits no edema and no tenderness.  Neurological: She is alert. No sensory deficit. Cranial nerve deficit: questionable left sided facial droop. tongue midline, EOMI, slurred speech but more consistent with dysarthria. She exhibits normal muscle tone. She displays no seizure activity. Coordination normal.  Patient is able to lift both arms off the bed, she is  unable to lift either leg off the bed sensation intact in all extremities, no visual field cuts, no left or right sided neglect  Skin: Skin is warm and dry. No rash noted.  Psychiatric: She has a normal mood and affect.    ED Course  Procedures (including critical care time) Labs Review Labs Reviewed  GLUCOSE, CAPILLARY - Abnormal; Notable for the following:    Glucose-Capillary 134 (*)    All other components within normal limits  COMPREHENSIVE METABOLIC PANEL - Abnormal; Notable for the following:    Glucose, Bld 120 (*)    Alkaline Phosphatase 148 (*)    All other components within normal limits  URINE RAPID DRUG SCREEN (HOSP PERFORMED) - Abnormal; Notable for the following:    Opiates POSITIVE (*)    All other components within normal limits  URINALYSIS, ROUTINE W REFLEX MICROSCOPIC - Abnormal; Notable for the following:    Hgb urine dipstick TRACE (*)    Bilirubin Urine SMALL (*)    Ketones, ur 15 (*)    All other components within normal limits  VALPROIC ACID LEVEL - Abnormal; Notable for the following:    Valproic Acid Lvl 27.8 (*)    All other components within normal limits  URINE MICROSCOPIC-ADD ON - Abnormal; Notable for the following:    Squamous Epithelial / LPF FEW (*)    Crystals CA OXALATE CRYSTALS (*)    All other components within normal limits  POCT I-STAT, CHEM 8 - Abnormal; Notable for the following:    Glucose, Bld 116 (*)    Calcium, Ion 1.25 (*)    All other components within normal limits  ETHANOL  PROTIME-INR  APTT  CBC  DIFFERENTIAL  TROPONIN I  POCT I-STAT TROPONIN I   Imaging Review Ct Head Wo Contrast  01/30/2013   CLINICAL DATA:  Hypertension. Seizure disorder. Headache.  EXAM: CT HEAD WITHOUT CONTRAST  TECHNIQUE: Contiguous axial images were obtained from the base of the skull through the vertex without intravenous contrast.  COMPARISON:  01/04/2013 and multiple previous  FINDINGS: The brain has a normal appearance without evidence of  atrophy, infarction, mass lesion, hemorrhage, hydrocephalus or extra-axial collection. The calvarium is unremarkable. The paranasal sinuses, middle ears and mastoids are clear. Vascular calcification does affect the major vessels at the base of the brain.  IMPRESSION: Normal appearance of the brain itself. Atherosclerotic calcification of the major vessels at the base of the brain.   Electronically Signed   By: Paulina Fusi M.D.   On: 01/30/2013 19:51   Mr Brain Wo Contrast  01/30/2013   CLINICAL DATA:  Slurred speech, seizure. Evaluate for stroke  EXAM: MRI HEAD WITHOUT CONTRAST  TECHNIQUE: Multiplanar, multiecho pulse sequences  of the brain and surrounding structures were obtained without intravenous contrast.  COMPARISON:  Prior CT from earlier the same day  FINDINGS: No abnormal restricted diffusion is seen within the brain to suggest acute ischemic infarct. There is no intracranial hemorrhage. Gray-white matter differentiation is well maintained. Normal intravascular flow voids are seen within the intracranial circulation.  CSF containing spaces are within normal limits. Ventricles are normal in size without evidence of hydrocephalus. No significant atrophy. No extra-axial fluid collection.  Corpus callosum is normal. The craniocervical junction is within normal limits. Globes, optic nerves, and optic chiasm demonstrate a normal appearance with normal signal intensity. Pituitary gland is normal. Pituitary stalk is midline.  Calvarium demonstrates a normal appearance with normal signal intensity. Scalp soft tissues are normal.  Paranasal sinuses are clear. The mastoid all air cells are under - pneumatized, better evaluated on prior CT.  IMPRESSION: No acute intracranial infarct or other process identified within the brain.   Electronically Signed   By: Rise Mu M.D.   On: 01/30/2013 22:44    EKG Interpretation     Ventricular Rate:  62 PR Interval:  162 QRS Duration: 96 QT  Interval:  406 QTC Calculation: 412 R Axis:   69 Text Interpretation:  Sinus rhythm No significant change since last tracing            MDM   1. Conversion disorder with mixed presentation    1138 Discussed findings with patient.  MRi does not show evidence of stroke.  Suspect pt's symptoms may be related to a conversion disorder.  She is eating and moving all extremities.  Doubt Toss's paralysis ,  she has not had any recurrent seizures.  Will make sure patient can ambulate.  Dc home, follow up with PCP    Celene Kras, MD 01/30/13 [phone]

## 2013-01-30 NOTE — ED Notes (Signed)
Patient transported to MRI 

## 2013-01-30 NOTE — ED Notes (Signed)
Patient transported to CT 

## 2013-01-30 NOTE — ED Notes (Addendum)
Pt from home. Was in ER last night for c/o seizure and slurred speech [place] se[place]. Was discharged and was last seen having slurred speech last night. Family friend checked on pt today and found slurred speech to be worse. Pt is alert and oriented x 3, knows that she was in the ER for same complaint, though does not know when slurred speech started again or if she had another seizure activity. Pt has left sided facial droop per EMS and [facility] equally. Pt has a history of schizophrenia and bipolar disorder.

## 2013-01-31 NOTE — ED Notes (Signed)
Pt ambulate round the unit with one person stand by assist, pt is unsteady with her gait. MD aware,

## 2013-02-13 ENCOUNTER — Emergency Department (HOSPITAL_COMMUNITY)
Admission: EM | Admit: 2013-02-13 | Discharge: 2013-02-13 | Disposition: A | Discharge status: Home / Self Care | Payer: Medicaid Other | Attending: Emergency Medicine | Admitting: Emergency Medicine

## 2013-02-13 ENCOUNTER — Encounter (HOSPITAL_COMMUNITY): Payer: Self-pay | Admitting: Emergency Medicine

## 2013-02-13 DIAGNOSIS — Z859 Personal history of malignant neoplasm, unspecified: Secondary | ICD-10-CM | POA: Insufficient documentation

## 2013-02-13 DIAGNOSIS — F410 Panic disorder [episodic paroxysmal anxiety] without agoraphobia: Secondary | ICD-10-CM | POA: Insufficient documentation

## 2013-02-13 DIAGNOSIS — J4489 Other specified chronic obstructive pulmonary disease: Secondary | ICD-10-CM | POA: Insufficient documentation

## 2013-02-13 DIAGNOSIS — G4730 Sleep apnea, unspecified: Secondary | ICD-10-CM | POA: Insufficient documentation

## 2013-02-13 DIAGNOSIS — G43909 Migraine, unspecified, not intractable, without status migrainosus: Secondary | ICD-10-CM | POA: Insufficient documentation

## 2013-02-13 DIAGNOSIS — Z79899 Other long term (current) drug therapy: Secondary | ICD-10-CM | POA: Insufficient documentation

## 2013-02-13 DIAGNOSIS — J449 Chronic obstructive pulmonary disease, unspecified: Secondary | ICD-10-CM | POA: Insufficient documentation

## 2013-02-13 DIAGNOSIS — Z880 Allergy status to penicillin: Secondary | ICD-10-CM | POA: Insufficient documentation

## 2013-02-13 DIAGNOSIS — E119 Type 2 diabetes mellitus without complications: Secondary | ICD-10-CM | POA: Insufficient documentation

## 2013-02-13 DIAGNOSIS — G40909 Epilepsy, unspecified, not intractable, without status epilepticus: Secondary | ICD-10-CM | POA: Insufficient documentation

## 2013-02-13 DIAGNOSIS — I2510 Atherosclerotic heart disease of native coronary artery without angina pectoris: Secondary | ICD-10-CM | POA: Insufficient documentation

## 2013-02-13 DIAGNOSIS — F17200 Nicotine dependence, unspecified, uncomplicated: Secondary | ICD-10-CM | POA: Insufficient documentation

## 2013-02-13 DIAGNOSIS — I10 Essential (primary) hypertension: Secondary | ICD-10-CM | POA: Insufficient documentation

## 2013-02-13 NOTE — ED Notes (Signed)
Patient started shaking, hyperventilating, patient has been really stressed after hearing that she was not getting the meds she wanted from her psychiatrist.

## 2013-02-13 NOTE — ED Provider Notes (Signed)
Medical screening examination/treatment/procedure(s) were performed by non-physician practitioner and as supervising physician I was immediately available for consultation/collaboration.  EKG Interpretation   None         Kristen N Ward, DO 02/13/13 2345 

## 2013-02-13 NOTE — ED Notes (Signed)
Bed: ZO10 Expected date:  Expected time:  Means of arrival:  Comments: EMS 47yo F, panic attack / MR / ? seizure

## 2013-02-13 NOTE — ED Provider Notes (Signed)
CSN: [id number]     Arrival date & time 02/13/13  1913 History   First MD Initiated Contact with Patient 02/13/13 1924     Chief Complaint  Patient presents with  . Panic Attack   (Consider location/radiation/quality/duration/timing/severity/associated sxs/prior Treatment) HPI Comments: Patient is a 47 year old female the past medical history of schizophrenia, anxiety, seizures, hypertension, diabetes and CAD who presents to emergency department via EMS with her friend complaining of a panic attack beginning after seeing her psychiatrist today. Patient states she's been really stressed and anxious after hearing that she was not getting the medication that she wanted from her psychiatrist, he also told her to stop other medications. Patient is very nonspecific about which medications psychiatrist was talking about. Patient is currently hysterical crying, very anxious. Denies SI/HI.  The history is provided by the patient and a friend.    Past Medical History  Diagnosis Date  . Hypertension   . Seizures   . COPD (chronic obstructive pulmonary disease)   . Diabetes mellitus without complication   . Cancer throat  . Sleep apnea   . Coronary artery disease   . Schizophrenia   . Migraine    Past Surgical History  Procedure Laterality Date  . Abdominal hysterectomy    . Appendectomy    . Cholecystectomy    . Eye surgery    . Cesarean section    . Knee surgery     No family history on file. History  Substance Use Topics  . Smoking status: Current Every Day Smoker -- 0.50 packs/day for 30 years    Types: Cigarettes  . Smokeless tobacco: Never Used  . Alcohol Use: No   OB History   Grav Para Term Preterm Abortions TAB SAB Ect Mult Living                 Review of Systems  Psychiatric/Behavioral: The patient is nervous/anxious.   All other systems reviewed and are negative.    Allergies  Bee venom; Aspirin; Other; Penicillins; and Vancomycin  Home Medications   Current  Outpatient Rx  Name  Route  Sig  Dispense  Refill  . divalproex (DEPAKOTE) 500 MG DR tablet   Oral   Take 500 mg by mouth 2 (two) times daily.         . nitroGLYCERIN (NITROSTAT) 0.4 MG SL tablet   Sublingual   Place 0.4 mg under the tongue every 5 (five) minutes as needed for chest pain.         . paliperidone (INVEGA) 3 MG 24 hr tablet   Oral   Take 3 mg by mouth at bedtime.         . Vitamin D, Ergocalciferol, (DRISDOL) 50000 UNITS CAPS capsule   Oral   Take 50,000 Units by mouth every 7 (seven) days. Takes on Monday          BP 146/106  Pulse 68  Temp(Src) 99.1 F (37.3 C) (Oral)  Resp 20  SpO2 99% Physical Exam  Nursing note and vitals reviewed. Constitutional: She is oriented to person, place, and time. She appears well-developed and well-nourished. No distress.  Tearful, anxious.  HENT:  Head: Normocephalic and atraumatic.  Mouth/Throat: Oropharynx is clear and moist.  Eyes: Conjunctivae and EOM are normal. Pupils are equal, round, and reactive to light.  Neck: Normal range of motion. Neck supple.  Cardiovascular: Normal rate, regular rhythm and normal heart sounds.   Pulmonary/Chest: Effort normal and breath sounds normal.  Abdominal: Soft. Bowel  sounds are normal. There is no tenderness.  Musculoskeletal: Normal range of motion. She exhibits no edema.  Neurological: She is alert and oriented to person, place, and time.  Skin: Skin is warm and dry. She is not diaphoretic.  Psychiatric: Her mood appears anxious. She is hyperactive. She expresses no homicidal and no suicidal ideation.    ED Course  Procedures (including critical care time) Labs Review Labs Reviewed - No data to display Imaging Review No results found.  EKG Interpretation   None       MDM   1. Panic attack    Pt presenting with panic attack after disagreement with psychiatrist. Hypertensive at 146/106 but is hysterical on exam. She is stable for discharge, f/u with  psychiatrist.   Trevor Mace, PA-C 02/13/13 1955

## 2013-02-13 NOTE — ED Notes (Signed)
PA at bedside.

## 2013-03-21 ENCOUNTER — Emergency Department (HOSPITAL_COMMUNITY): Payer: Medicaid Other

## 2013-03-21 ENCOUNTER — Encounter (HOSPITAL_COMMUNITY): Payer: Self-pay | Admitting: Emergency Medicine

## 2013-03-21 ENCOUNTER — Emergency Department (HOSPITAL_COMMUNITY)
Admission: EM | Admit: 2013-03-21 | Discharge: 2013-03-21 | Disposition: A | Discharge status: Home / Self Care | Payer: Medicaid Other | Attending: Emergency Medicine | Admitting: Emergency Medicine

## 2013-03-21 DIAGNOSIS — Z9114 Patient's other noncompliance with medication regimen: Secondary | ICD-10-CM

## 2013-03-21 DIAGNOSIS — Z79899 Other long term (current) drug therapy: Secondary | ICD-10-CM | POA: Insufficient documentation

## 2013-03-21 DIAGNOSIS — Y939 Activity, unspecified: Secondary | ICD-10-CM | POA: Insufficient documentation

## 2013-03-21 DIAGNOSIS — J4489 Other specified chronic obstructive pulmonary disease: Secondary | ICD-10-CM | POA: Insufficient documentation

## 2013-03-21 DIAGNOSIS — W1809XA Striking against other object with subsequent fall, initial encounter: Secondary | ICD-10-CM | POA: Insufficient documentation

## 2013-03-21 DIAGNOSIS — I2510 Atherosclerotic heart disease of native coronary artery without angina pectoris: Secondary | ICD-10-CM | POA: Insufficient documentation

## 2013-03-21 DIAGNOSIS — G40909 Epilepsy, unspecified, not intractable, without status epilepticus: Secondary | ICD-10-CM | POA: Insufficient documentation

## 2013-03-21 DIAGNOSIS — F319 Bipolar disorder, unspecified: Secondary | ICD-10-CM | POA: Insufficient documentation

## 2013-03-21 DIAGNOSIS — S0990XA Unspecified injury of head, initial encounter: Secondary | ICD-10-CM

## 2013-03-21 DIAGNOSIS — Z91199 Patient's noncompliance with other medical treatment and regimen due to unspecified reason: Secondary | ICD-10-CM | POA: Insufficient documentation

## 2013-03-21 DIAGNOSIS — R569 Unspecified convulsions: Secondary | ICD-10-CM

## 2013-03-21 DIAGNOSIS — Z9089 Acquired absence of other organs: Secondary | ICD-10-CM | POA: Insufficient documentation

## 2013-03-21 DIAGNOSIS — F17200 Nicotine dependence, unspecified, uncomplicated: Secondary | ICD-10-CM | POA: Insufficient documentation

## 2013-03-21 DIAGNOSIS — I10 Essential (primary) hypertension: Secondary | ICD-10-CM | POA: Insufficient documentation

## 2013-03-21 DIAGNOSIS — Y929 Unspecified place or not applicable: Secondary | ICD-10-CM | POA: Insufficient documentation

## 2013-03-21 DIAGNOSIS — Z9119 Patient's noncompliance with other medical treatment and regimen: Secondary | ICD-10-CM | POA: Insufficient documentation

## 2013-03-21 DIAGNOSIS — Z880 Allergy status to penicillin: Secondary | ICD-10-CM | POA: Insufficient documentation

## 2013-03-21 DIAGNOSIS — R5381 Other malaise: Secondary | ICD-10-CM | POA: Insufficient documentation

## 2013-03-21 DIAGNOSIS — Z5900 Homelessness unspecified: Secondary | ICD-10-CM | POA: Insufficient documentation

## 2013-03-21 DIAGNOSIS — J449 Chronic obstructive pulmonary disease, unspecified: Secondary | ICD-10-CM | POA: Insufficient documentation

## 2013-03-21 DIAGNOSIS — F209 Schizophrenia, unspecified: Secondary | ICD-10-CM | POA: Insufficient documentation

## 2013-03-21 DIAGNOSIS — E119 Type 2 diabetes mellitus without complications: Secondary | ICD-10-CM | POA: Insufficient documentation

## 2013-03-21 DIAGNOSIS — Z859 Personal history of malignant neoplasm, unspecified: Secondary | ICD-10-CM | POA: Insufficient documentation

## 2013-03-21 HISTORY — DX: Other psychoactive substance dependence, uncomplicated: F19.20

## 2013-03-21 LAB — URINE MICROSCOPIC-ADD ON

## 2013-03-21 LAB — URINALYSIS, ROUTINE W REFLEX MICROSCOPIC
Bilirubin Urine: NEGATIVE
Ketones, ur: NEGATIVE mg/dL
Nitrite: NEGATIVE
Specific Gravity, Urine: 1.02 (ref 1.005–1.030)
Urobilinogen, UA: 1 mg/dL (ref 0.0–1.0)

## 2013-03-21 LAB — BASIC METABOLIC PANEL
GFR calc Af Amer: >90 mL/min (ref >90)
GFR calc non Af Amer: >90 mL/min (ref >90)
Glucose, Bld: 109 mg/dL — ABNORMAL HIGH (ref 70–99)
Potassium: 4.1 mEq/L (ref 3.7–5.3)
Sodium: 140 mEq/L (ref 137–147)

## 2013-03-21 MED ORDER — DIVALPROEX SODIUM 500 MG PO DR TAB
500.0000 mg | DELAYED_RELEASE_TABLET | Freq: Once | ORAL | Status: AC
  Administered 2013-03-21: 500 mg via ORAL
  Filled 2013-03-21: qty 1

## 2013-03-21 MED ORDER — METOCLOPRAMIDE HCL 5 MG/ML IJ SOLN
10.0000 mg | Freq: Once | INTRAMUSCULAR | Status: AC
  Administered 2013-03-21: 10 mg via INTRAVENOUS
  Filled 2013-03-21: qty 2

## 2013-03-21 MED ORDER — DIVALPROEX SODIUM 500 MG PO DR TAB: 500.0000 mg | DELAYED_RELEASE_TABLET | Freq: Two times a day (BID) | ORAL | Status: DC

## 2013-03-21 MED ORDER — FENTANYL CITRATE 0.05 MG/ML IJ SOLN
50.0000 ug | Freq: Once | INTRAMUSCULAR | Status: AC
  Administered 2013-03-21: 50 ug via INTRAVENOUS
  Filled 2013-03-21: qty 2

## 2013-03-21 MED ORDER — DIPHENHYDRAMINE HCL 25 MG PO CAPS
25.0000 mg | ORAL_CAPSULE | Freq: Once | ORAL | Status: AC
  Administered 2013-03-21: 25 mg via ORAL
  Filled 2013-03-21: qty 1

## 2013-03-21 MED ORDER — KETOROLAC TROMETHAMINE 30 MG/ML IJ SOLN
15.0000 mg | Freq: Once | INTRAMUSCULAR | Status: DC
  Filled 2013-03-21: qty 1

## 2013-03-21 MED ORDER — ACETAMINOPHEN 325 MG PO TABS
650.0000 mg | ORAL_TABLET | Freq: Once | ORAL | Status: DC
  Filled 2013-03-21: qty 2

## 2013-03-21 MED ORDER — SODIUM CHLORIDE 0.9 % IV BOLUS (SEPSIS)
500.0000 mL | Freq: Once | INTRAVENOUS | Status: AC
  Administered 2013-03-21: 500 mL via INTRAVENOUS

## 2013-03-21 NOTE — ED Notes (Signed)
Per [facility]- Pt is homeless. Pt at [employer] and un witnessed seizure. Pt is off meds. HX of same. Pt is not postictal. Neuro intact GCS 15 PEERL

## 2013-03-21 NOTE — ED Provider Notes (Signed)
CSN: [id number]     Arrival date & time 03/21/13  1210 History   First MD Initiated Contact with Patient 03/21/13 1234     Chief Complaint  Patient presents with  . Seizures   (Consider location/radiation/quality/duration/timing/severity/associated sxs/prior Treatment) HPI Comments: 47 yo female with copd, dm, bipolar, seizures, non compliant with depakote presents after 2 min witnessed generalized seizure, pt did hit ha, had generalized HA.  Similar to previous.  Pt has random seizures.  Does not take depakote.  No recent infection.  No etoh.  Pt is homeless, fried with pt.    Patient is a 47 y.o. female presenting with seizures. The history is provided by the patient.  Seizures   Past Medical History  Diagnosis Date  . Hypertension   . Seizures   . COPD (chronic obstructive pulmonary disease)   . Diabetes mellitus without complication   . Cancer throat  . Sleep apnea   . Coronary artery disease   . Schizophrenia   . Migraine   . Addiction to drug    Past Surgical History  Procedure Laterality Date  . Abdominal hysterectomy    . Appendectomy    . Cholecystectomy    . Eye surgery    . Cesarean section    . Knee surgery     No family history on file. History  Substance Use Topics  . Smoking status: Current Every Day Smoker -- 0.50 packs/day for 30 years    Types: Cigarettes  . Smokeless tobacco: Never Used  . Alcohol Use: No   OB History   Grav Para Term Preterm Abortions TAB SAB Ect Mult Living                 Review of Systems  Constitutional: Positive for fatigue. Negative for fever and chills.  HENT: Negative for congestion.   Eyes: Negative for visual disturbance.  Respiratory: Negative for shortness of breath.   Cardiovascular: Negative for chest pain.  Gastrointestinal: Negative for vomiting and abdominal pain.  Genitourinary: Negative for dysuria and flank pain.  Musculoskeletal: Positive for arthralgias. Negative for neck pain and neck stiffness.   Skin: Negative for rash.  Neurological: Positive for seizures and headaches. Negative for light-headedness.    Allergies  Bee venom; Aspirin; Other; Penicillins; and Vancomycin  Home Medications   Current Outpatient Rx  Name  Route  Sig  Dispense  Refill  . divalproex (DEPAKOTE) 500 MG DR tablet   Oral   Take 500 mg by mouth 2 (two) times daily.         . nitroGLYCERIN (NITROSTAT) 0.4 MG SL tablet   Sublingual   Place 0.4 mg under the tongue every 5 (five) minutes as needed for chest pain.         . paliperidone (INVEGA) 3 MG 24 hr tablet   Oral   Take 3 mg by mouth at bedtime.         . Vitamin D, Ergocalciferol, (DRISDOL) 50000 UNITS CAPS capsule   Oral   Take 50,000 Units by mouth every 7 (seven) days. Takes on Monday          BP 115/59  Pulse 57  Temp(Src) 97.7 F (36.5 C) (Oral)  Resp 16  SpO2 97% Physical Exam  Nursing note and vitals reviewed. Constitutional: She is oriented to person, place, and time. She appears well-developed and well-nourished.  HENT:  Head: Normocephalic and atraumatic.  Eyes: Conjunctivae are normal. Right eye exhibits no discharge. Left eye exhibits no discharge.  Neck: Normal range of motion. Neck supple. No tracheal deviation present.  Cardiovascular: Normal rate and regular rhythm.   Pulmonary/Chest: Effort normal and breath sounds normal.  Abdominal: Soft. She exhibits no distension. There is no tenderness. There is no guarding.  Musculoskeletal: She exhibits no edema.  Neurological: She is alert and oriented to person, place, and time. No cranial nerve deficit. GCS eye subscore is 4. GCS verbal subscore is 5. GCS motor subscore is 6.  5+ strength in UE and LE with f/e at major joints. Sensation to palpation intact in UE and LE. CNs 2-12 grossly intact.  EOMFI.  PERRL.   Finger nose and coordination intact bilateral.   Visual fields intact to finger testing.   Skin: Skin is warm. No rash noted.  Psychiatric: She has a  normal mood and affect.    ED Course  Procedures (including critical care time) Labs Review Labs Reviewed  BASIC METABOLIC PANEL - Abnormal; Notable for the following:    Glucose, Bld 109 (*)    All other components within normal limits  URINALYSIS, ROUTINE W REFLEX MICROSCOPIC - Abnormal; Notable for the following:    Hgb urine dipstick TRACE (*)    All other components within normal limits  URINE MICROSCOPIC-ADD ON - Abnormal; Notable for the following:    Bacteria, UA MANY (*)    Crystals CA OXALATE CRYSTALS (*)    All other components within normal limits   Imaging Review Ct Head Wo Contrast  03/21/2013   CLINICAL DATA:  Seizure with headache today  EXAM: CT HEAD WITHOUT CONTRAST  TECHNIQUE: Contiguous axial images were obtained from the base of the skull through the vertex without contrast.  COMPARISON:  MR brain 01/30/2013.  CT head 01/30/2013  FINDINGS: No evidence for acute infarction, hemorrhage, mass lesion, hydrocephalus, or extra-axial fluid. There is no atrophy or white matter disease. Premature vascular calcification in the carotid siphon regions. Calvarium intact. Clear sinuses and mastoids. Unchanged appearance from priors.  IMPRESSION: Negative exam.  No acute intracranial abnormality.   Electronically Signed   By: Davonna Belling M.D.   On: 03/21/2013 13:31    EKG Interpretation   None       MDM   1. Seizure   2. Non compliance w medication regimen   3. Head injury, initial encounter   4. Headache    Recurrent seizures likely from non compliance. With HA and seizure CT head done.  Pt improved in ED, normal neuro. Observed in ED, no seizures, pt at baseline on recheck, normal neuro, pt has a friend to help her. Strongly encouraged taking seizure meds, no driving.  Migraine meds given.    Results and differential diagnosis were discussed with the patient. Close follow up outpatient was discussed, patient comfortable with the plan.   Diagnosis:  above      Enid Skeens, MD 03/21/13 [phone]

## 2013-03-21 NOTE — ED Notes (Signed)
Bed: WA03 Expected date:  Expected time:  Means of arrival:  Comments: EMS 

## 2013-04-18 ENCOUNTER — Emergency Department (HOSPITAL_COMMUNITY): Payer: Medicaid Other

## 2013-04-18 ENCOUNTER — Emergency Department (HOSPITAL_COMMUNITY)
Admission: EM | Admit: 2013-04-18 | Discharge: 2013-04-18 | Disposition: A | Discharge status: Home / Self Care | Payer: Medicaid Other | Attending: Emergency Medicine | Admitting: Emergency Medicine

## 2013-04-18 ENCOUNTER — Encounter (HOSPITAL_COMMUNITY): Payer: Self-pay | Admitting: Emergency Medicine

## 2013-04-18 DIAGNOSIS — G40909 Epilepsy, unspecified, not intractable, without status epilepticus: Secondary | ICD-10-CM | POA: Insufficient documentation

## 2013-04-18 DIAGNOSIS — Z720 Tobacco use: Secondary | ICD-10-CM

## 2013-04-18 DIAGNOSIS — I2510 Atherosclerotic heart disease of native coronary artery without angina pectoris: Secondary | ICD-10-CM | POA: Insufficient documentation

## 2013-04-18 DIAGNOSIS — I10 Essential (primary) hypertension: Secondary | ICD-10-CM | POA: Insufficient documentation

## 2013-04-18 DIAGNOSIS — Z792 Long term (current) use of antibiotics: Secondary | ICD-10-CM | POA: Insufficient documentation

## 2013-04-18 DIAGNOSIS — G43909 Migraine, unspecified, not intractable, without status migrainosus: Secondary | ICD-10-CM | POA: Insufficient documentation

## 2013-04-18 DIAGNOSIS — Z859 Personal history of malignant neoplasm, unspecified: Secondary | ICD-10-CM | POA: Insufficient documentation

## 2013-04-18 DIAGNOSIS — Z880 Allergy status to penicillin: Secondary | ICD-10-CM | POA: Insufficient documentation

## 2013-04-18 DIAGNOSIS — J441 Chronic obstructive pulmonary disease with (acute) exacerbation: Secondary | ICD-10-CM | POA: Insufficient documentation

## 2013-04-18 DIAGNOSIS — R51 Headache: Secondary | ICD-10-CM | POA: Insufficient documentation

## 2013-04-18 DIAGNOSIS — E119 Type 2 diabetes mellitus without complications: Secondary | ICD-10-CM | POA: Insufficient documentation

## 2013-04-18 DIAGNOSIS — F209 Schizophrenia, unspecified: Secondary | ICD-10-CM | POA: Insufficient documentation

## 2013-04-18 DIAGNOSIS — F17200 Nicotine dependence, unspecified, uncomplicated: Secondary | ICD-10-CM | POA: Insufficient documentation

## 2013-04-18 DIAGNOSIS — Z8669 Personal history of other diseases of the nervous system and sense organs: Secondary | ICD-10-CM | POA: Insufficient documentation

## 2013-04-18 DIAGNOSIS — J40 Bronchitis, not specified as acute or chronic: Secondary | ICD-10-CM

## 2013-04-18 DIAGNOSIS — IMO0002 Reserved for concepts with insufficient information to code with codable children: Secondary | ICD-10-CM | POA: Insufficient documentation

## 2013-04-18 DIAGNOSIS — Z79899 Other long term (current) drug therapy: Secondary | ICD-10-CM | POA: Insufficient documentation

## 2013-04-18 DIAGNOSIS — E669 Obesity, unspecified: Secondary | ICD-10-CM | POA: Insufficient documentation

## 2013-04-18 LAB — BASIC METABOLIC PANEL
BUN: 10 mg/dL (ref 6–23)
CO2: 27 mEq/L (ref 19–32)
Calcium: 9.2 mg/dL (ref 8.4–10.5)
Chloride: 103 mEq/L (ref 96–112)
Creatinine, Ser: 0.64 mg/dL (ref 0.50–1.10)
Glucose, Bld: 155 mg/dL — ABNORMAL HIGH (ref 70–99)
POTASSIUM: 4.3 meq/L (ref 3.7–5.3)
SODIUM: 142 meq/L (ref 137–147)

## 2013-04-18 LAB — URINALYSIS, ROUTINE W REFLEX MICROSCOPIC
Bilirubin Urine: NEGATIVE
GLUCOSE, UA: NEGATIVE mg/dL
Ketones, ur: NEGATIVE mg/dL
Leukocytes, UA: NEGATIVE
Nitrite: NEGATIVE
PROTEIN: NEGATIVE mg/dL
Specific Gravity, Urine: 1.017 (ref 1.005–1.030)
UROBILINOGEN UA: 0.2 mg/dL (ref 0.0–1.0)
pH: 6.5 (ref 5.0–8.0)

## 2013-04-18 LAB — CBC
HCT: 40.6 % (ref 36.0–46.0)
HEMOGLOBIN: 13.7 g/dL (ref 12.0–15.0)
MCH: 31 pg (ref 26.0–34.0)
MCHC: 33.7 g/dL (ref 30.0–36.0)
MCV: 91.9 fL (ref 78.0–100.0)
Platelets: 200 10*3/uL (ref 150–400)
RBC: 4.42 MIL/uL (ref 3.87–5.11)
RDW: 12.6 % (ref 11.5–15.5)
WBC: 9.3 10*3/uL (ref 4.0–10.5)

## 2013-04-18 LAB — URINE MICROSCOPIC-ADD ON

## 2013-04-18 LAB — POCT I-STAT TROPONIN I: TROPONIN I, POC: 0 ng/mL (ref 0.00–0.08)

## 2013-04-18 LAB — GLUCOSE, CAPILLARY: Glucose-Capillary: 79 mg/dL (ref 70–99)

## 2013-04-18 MED ORDER — PREDNISONE 20 MG PO TABS
60.0000 mg | ORAL_TABLET | Freq: Once | ORAL | Status: AC
  Administered 2013-04-18: 60 mg via ORAL
  Filled 2013-04-18: qty 3

## 2013-04-18 MED ORDER — LEVOFLOXACIN 750 MG PO TABS: 750.0000 mg | ORAL_TABLET | Freq: Every day | ORAL | Status: DC

## 2013-04-18 MED ORDER — KETOROLAC TROMETHAMINE 30 MG/ML IJ SOLN
30.0000 mg | Freq: Once | INTRAMUSCULAR | Status: DC
  Filled 2013-04-18: qty 1

## 2013-04-18 MED ORDER — ALBUTEROL SULFATE HFA 108 (90 BASE) MCG/ACT IN AERS
2.0000 | INHALATION_SPRAY | RESPIRATORY_TRACT | Status: DC | PRN
  Administered 2013-04-18: 2 via RESPIRATORY_TRACT
  Filled 2013-04-18: qty 6.7

## 2013-04-18 MED ORDER — LEVOFLOXACIN 750 MG PO TABS
750.0000 mg | ORAL_TABLET | Freq: Once | ORAL | Status: AC
  Administered 2013-04-18: 750 mg via ORAL
  Filled 2013-04-18: qty 1

## 2013-04-18 MED ORDER — PREDNISONE 20 MG PO TABS: 20.0000 mg | ORAL_TABLET | Freq: Two times a day (BID) | ORAL | Status: DC

## 2013-04-18 MED ORDER — AEROCHAMBER PLUS W/MASK MISC
1.0000 | Freq: Once | Status: AC
  Administered 2013-04-18: 1
  Filled 2013-04-18: qty 1

## 2013-04-18 MED ORDER — PROCHLORPERAZINE EDISYLATE 5 MG/ML IJ SOLN
10.0000 mg | Freq: Once | INTRAMUSCULAR | Status: AC
  Administered 2013-04-18: 10 mg via INTRAVENOUS
  Filled 2013-04-18: qty 2

## 2013-04-18 MED ORDER — AEROCHAMBER Z-STAT PLUS/MEDIUM MISC: 1.0000 | Freq: Once | Status: DC

## 2013-04-18 NOTE — ED Provider Notes (Signed)
CSN: [id number]     Arrival date & time 04/18/13  1145 History   First MD Initiated Contact with Patient 04/18/13 1841     Chief Complaint  Patient presents with  . Cough  . Chest Pain   (Consider location/radiation/quality/duration/timing/severity/associated sxs/prior Treatment) Patient is a 48 y.o. female presenting with cough and chest pain. The history is provided by the patient.  Cough Associated symptoms: chest pain   Chest Pain Associated symptoms: cough    She complains of cough, chest discomfort since yesterday. Cough is productive. She smokes cigarettes. She does not have a local Dr. She states that she had several seizures last  week. She also complains of a mild headache. There has been no syncope, shortness of breath, abdominal pain, or pain, nausea, vomiting, dysuria, or urinary frequency. There are no other known modifying factors.  Past Medical History  Diagnosis Date  . Hypertension   . Seizures   . COPD (chronic obstructive pulmonary disease)   . Diabetes mellitus without complication   . Cancer throat  . Sleep apnea   . Coronary artery disease   . Schizophrenia   . Migraine   . Addiction to drug    Past Surgical History  Procedure Laterality Date  . Abdominal hysterectomy    . Appendectomy    . Cholecystectomy    . Eye surgery    . Cesarean section    . Knee surgery     History reviewed. No pertinent family history. History  Substance Use Topics  . Smoking status: Current Every Day Smoker -- 0.50 packs/day for 30 years    Types: Cigarettes  . Smokeless tobacco: Never Used  . Alcohol Use: No   OB History   Grav Para Term Preterm Abortions TAB SAB Ect Mult Living                 Review of Systems  Respiratory: Positive for cough.   Cardiovascular: Positive for chest pain.  All other systems reviewed and are negative.    Allergies  Bee venom; Aspirin; Other; Penicillins; and Vancomycin  Home Medications   Current Outpatient Rx  Name   Route  Sig  Dispense  Refill  . divalproex (DEPAKOTE) 500 MG DR tablet   Oral   Take 1 tablet (500 mg total) by mouth 2 (two) times daily.   60 tablet   0   . FLUoxetine (PROZAC) 20 MG capsule   Oral   Take 20 mg by mouth daily.         . Lurasidone HCl (LATUDA) 20 MG TABS   Oral   Take 20 mg by mouth daily.         . nitroGLYCERIN (NITROSTAT) 0.4 MG SL tablet   Sublingual   Place 0.4 mg under the tongue every 5 (five) minutes as needed for chest pain.         . Vitamin D, Ergocalciferol, (DRISDOL) 50000 UNITS CAPS capsule   Oral   Take 50,000 Units by mouth every 7 (seven) days. Takes on Monday         . levofloxacin (LEVAQUIN) 750 MG tablet   Oral   Take 1 tablet (750 mg total) by mouth daily. X 7 days   7 tablet   0   . predniSONE (DELTASONE) 20 MG tablet   Oral   Take 1 tablet (20 mg total) by mouth 2 (two) times daily.   10 tablet   0    BP 101/74  Pulse 61  Temp(Src) 98.1 F (36.7 C) (Oral)  Resp 24  SpO2 100% Physical Exam  Nursing note and vitals reviewed. Constitutional: She is oriented to person, place, and time. She appears well-developed.  Obese  HENT:  Head: Normocephalic and atraumatic.  Eyes: Conjunctivae and EOM are normal. Pupils are equal, round, and reactive to light.  Neck: Normal range of motion and phonation normal. Neck supple.  Cardiovascular: Normal rate, regular rhythm and intact distal pulses.   Pulmonary/Chest: Effort normal. No respiratory distress. She exhibits no tenderness.  Decreased air movement, with scattered rhonchi and wheezes, bilaterally. No increased work of breathing.  Abdominal: Soft. She exhibits no distension. There is no tenderness. There is no guarding.  Musculoskeletal: Normal range of motion. She exhibits no edema and no tenderness.  Neurological: She is alert and oriented to person, place, and time. She exhibits normal muscle tone.  Skin: Skin is warm and dry.  Psychiatric: She has a normal mood and  affect. Her behavior is normal. Judgment and thought content normal.    ED Course  Procedures (including critical care time) Medications  predniSONE (DELTASONE) tablet 60 mg (60 mg Oral Given 04/18/13 1900)  levofloxacin (LEVAQUIN) tablet 750 mg (750 mg Oral Given 04/18/13 1900)  aerochamber plus with mask device 1 each (1 each Other Given 04/18/13 1901)  prochlorperazine (COMPAZINE) injection 10 mg (10 mg Intravenous Given 04/18/13 1920)        Labs Review Labs Reviewed  BASIC METABOLIC PANEL - Abnormal; Notable for the following:    Glucose, Bld 155 (*)    All other components within normal limits  URINALYSIS, ROUTINE W REFLEX MICROSCOPIC - Abnormal; Notable for the following:    Hgb urine dipstick SMALL (*)    All other components within normal limits  CBC  URINE MICROSCOPIC-ADD ON  GLUCOSE, CAPILLARY  INFLUENZA PANEL BY PCR (TYPE A & B, H1N1)  POCT I-STAT TROPONIN I   Imaging Review Dg Chest 2 View (if Patient Has Fever And/or Copd)  04/18/2013   CLINICAL DATA:  Cough.  Chest pain.  EXAM: CHEST  2 VIEW  COMPARISON:  Chest radiograph 01/11/2013.  FINDINGS: Normal cardiac and mediastinal contours. Mild elevation right hemidiaphragm. No consolidative pulmonary opacities no pleural effusion or pneumothorax. Regional skeleton is unremarkable.  IMPRESSION: No acute cardiopulmonary process.   Electronically Signed   By: Lovey Newcomer M.D.   On: 04/18/2013 13:08    EKG Interpretation    Date/Time:  Tuesday April 18 2013 11:57:11 EST Ventricular Rate:  57 PR Interval:  118 QRS Duration: 76 QT Interval:  424 QTC Calculation: 412 R Axis:   114 Text Interpretation:  Sinus bradycardia Right axis deviation Low voltage QRS Abnormal ECG Since last tracing rate slower Confirmed by Alcario Tinkey  MD, Phillip Maffei ([id number]) on 04/18/2013 6:59:04 PM            MDM   1. Bronchitis   2. Tobacco abuse     Evaluation is consistent with bronchitis secondary smoking. Doubt pneumonia, PE, metabolic  instability, or serious bacterial infection.  Nursing Notes Reviewed/ Care Coordinated Applicable Imaging Reviewed Interpretation of Laboratory Data incorporated into ED treatment  The patient appears reasonably screened and/or stabilized for discharge and I doubt any other medical condition or other [facility] requiring further screening, evaluation, or treatment in the ED at this time prior to discharge.  Plan: Home Medications- Levaquin, Prednisone; Home Treatments- stop smokng; return here if the recommended treatment, does not improve the symptoms; Recommended follow up- PCP prn  Richarda Blade, MD 04/20/13 [phone]

## 2013-04-18 NOTE — ED Notes (Signed)
PT coming up to desk repeatedly; pt reassured she will be called soon. Reports she is hungry and has to catch bus at 2300. RN explained she cannot give her food until MD sees pt.

## 2013-04-18 NOTE — Discharge Instructions (Signed)
Bronchitis Bronchitis is inflammation of the airways that extend from the windpipe into the lungs (bronchi). The inflammation often causes mucus to develop, which leads to a cough. If the inflammation becomes severe, it may cause shortness of breath. CAUSES  Bronchitis may be caused by:   Viral infections.   Bacteria.   Cigarette smoke.   Allergens, pollutants, and other irritants.  SIGNS AND SYMPTOMS  The most common symptom of bronchitis is a frequent cough that produces mucus. Other symptoms include:  Fever.   Body aches.   Chest congestion.   Chills.   Shortness of breath.   Sore throat.  DIAGNOSIS  Bronchitis is usually diagnosed through a medical history and physical exam. Tests, such as chest X-rays, are sometimes done to rule out other conditions.  TREATMENT  You may need to avoid contact with whatever caused the problem (smoking, for example). Medicines are sometimes needed. These may include:  Antibiotics. These may be prescribed if the condition is caused by bacteria.  Cough suppressants. These may be prescribed for relief of cough symptoms.   Inhaled medicines. These may be prescribed to help open your airways and make it easier for you to breathe.   Steroid medicines. These may be prescribed for those with recurrent (chronic) bronchitis. HOME CARE INSTRUCTIONS  Get plenty of rest.   Drink enough fluids to keep your urine clear or pale yellow (unless you have a medical condition that requires fluid restriction). Increasing fluids may help thin your secretions and will prevent dehydration.   Only take over-the-counter or prescription medicines as directed by your health care provider.  Only take antibiotics as directed. Make sure you finish them even if you start to feel better.  Avoid secondhand smoke, irritating chemicals, and strong fumes. These will make bronchitis worse. If you are a smoker, quit smoking. Consider using nicotine gum or  skin patches to help control withdrawal symptoms. Quitting smoking will help your lungs heal faster.   Put a cool-mist humidifier in your bedroom at night to moisten the air. This may help loosen mucus. Change the water in the humidifier daily. You can also run the hot water in your shower and sit in the bathroom with the door closed for 5 10 minutes.   Follow up with your health care provider as directed.   Wash your hands frequently to avoid catching bronchitis again or spreading an infection to others.  SEEK MEDICAL CARE IF: Your symptoms do not improve after 1 week of treatment.  SEEK IMMEDIATE MEDICAL CARE IF:  Your fever increases.  You have chills.   You have chest pain.   You have worsening shortness of breath.   You have bloody sputum.  You faint.  You have lightheadedness.  You have a severe headache.   You vomit repeatedly. MAKE SURE YOU:   Understand these instructions.  Will watch your condition.  Will get help right away if you are not doing well or get worse. Document Released: 03/09/2005 Document Revised: 12/28/2012 Document Reviewed: 11/01/2012 [facility] Patient Information 2014 [place].  Smoking Cessation, Tips for Success If you are ready to quit smoking, congratulations! You have chosen to help yourself be healthier. Cigarettes bring nicotine, tar, carbon monoxide, and other irritants into your body. Your lungs, heart, and blood vessels will be able to work better without these poisons. There are many different ways to quit smoking. Nicotine gum, nicotine patches, a nicotine inhaler, or nicotine nasal spray can help with physical craving. Hypnosis, support groups, and  medicines help break the habit of smoking. WHAT THINGS CAN I DO TO MAKE QUITTING EASIER?  Here are some tips to help you quit for good:  Pick a date when you will quit smoking completely. Tell all of your friends and family about your plan to quit on that date.  Do not  try to slowly cut down on the number of cigarettes you are smoking. Pick a quit date and quit smoking completely starting on that day.  Throw away all cigarettes.   Clean and remove all ashtrays from your home, work, and car.   On a card, write down your reasons for quitting. Carry the card with you and read it when you get the urge to smoke.   Cleanse your body of nicotine. Drink enough water and fluids to keep your urine clear or pale yellow. Do this after quitting to flush the nicotine from your body.   Learn to predict your moods. Do not let a bad situation be your excuse to have a cigarette. Some situations in your life might tempt you into wanting a cigarette.   Never have "just one" cigarette. It leads to wanting another and another. Remind yourself of your decision to quit.   Change habits associated with smoking. If you smoked while driving or when feeling stressed, try other activities to replace smoking. Stand up when drinking your coffee. Brush your teeth after eating. Sit in a different chair when you read the paper. Avoid alcohol while trying to quit, and try to drink fewer caffeinated beverages. Alcohol and caffeine may urge you to smoke.   Avoid foods and drinks that can trigger a desire to smoke, such as sugary or spicy foods and alcohol.   Ask people who smoke not to smoke around you.   Have something planned to do right after eating or having a cup of coffee. For example, plan to take a walk or exercise.   Try a relaxation exercise to calm you down and decrease your stress. Remember, you may be tense and nervous for the first 2 weeks after you quit, but this will pass.   Find new activities to keep your hands busy. Play with a pen, coin, or rubber band. Doodle or draw things on paper.   Brush your teeth right after eating. This will help cut down on the craving for the taste of tobacco after meals. You can also try mouthwash.   Use oral substitutes in place  of cigarettes. Try using lemon drops, carrots, cinnamon sticks, or chewing gum. Keep them handy so they are available when you have the urge to smoke.   When you have the urge to smoke, try deep breathing.   Designate your home as a nonsmoking area.   If you are a heavy smoker, ask your health care provider about a prescription for nicotine chewing gum. It can ease your withdrawal from nicotine.   Reward yourself. Set aside the cigarette money you save and buy yourself something nice.   Look for support from others. Join a support group or smoking cessation program. Ask someone at home or at work to help you with your plan to quit smoking.   Always ask yourself, "Do I need this cigarette or is this just a reflex?" Tell yourself, "Today, I choose not to smoke," or "I do not want to smoke." You are reminding yourself of your decision to quit.  Do not replace cigarette smoking with electronic cigarettes (commonly called e-cigarettes). The safety of e-cigarettes  is unknown, and some may contain harmful chemicals.  If you relapse, do not give up! Plan ahead and think about what you will do the next time you get the urge to smoke.  HOW WILL I FEEL WHEN I QUIT SMOKING? You may have symptoms of withdrawal because your body is used to nicotine (the addictive substance in cigarettes). You may crave cigarettes, be irritable, feel very hungry, cough often, get headaches, or have difficulty concentrating. The withdrawal symptoms are only temporary. They are strongest when you first quit but will go away within 10 14 days. When withdrawal symptoms occur, stay in control. Think about your reasons for quitting. Remind yourself that these are signs that your body is healing and getting used to being without cigarettes. Remember that withdrawal symptoms are easier to treat than the major diseases that smoking can cause.  Even after the withdrawal is over, expect periodic urges to smoke. However, these  cravings are generally short lived and will go away whether you smoke or not. Do not smoke!  WHAT RESOURCES ARE AVAILABLE TO HELP ME QUIT SMOKING? Your health care provider can direct you to community resources or hospitals for support, which may include:  Group support.  Education.  Hypnosis.  Therapy. Document Released: 12/06/2003 Document Revised: 12/28/2012 Document Reviewed: 08/25/2012 [facility] Patient Information 2014 [place], [place].  Smoking Hazards Smoking cigarettes is extremely bad for your health. Tobacco smoke has over 200 known poisons in it. It contains the poisonous gases nitrogen oxide and carbon monoxide. There are over 60 chemicals in tobacco smoke that cause cancer. Some of the chemicals found in cigarette smoke include:   Cyanide.   Benzene.   Formaldehyde.   Methanol (wood alcohol).   Acetylene (fuel used in welding torches).   Ammonia.  Even smoking lightly shortens your life expectancy by several years. You can greatly reduce the risk of medical problems for you and your family by stopping now. Smoking is the most preventable cause of death and disease in our society. Within days of quitting smoking, your circulation improves, you decrease the risk of having a heart attack, and your lung capacity improves. There may be some increased phlegm in the first few days after quitting, and it may take months for your lungs to clear up completely. Quitting for 10 years reduces your risk of developing lung cancer to almost that of a nonsmoker.  WHAT ARE THE RISKS OF SMOKING? Cigarette smokers have an increased risk of many serious medical problems, including:  Lung cancer.   Lung disease (such as pneumonia, bronchitis, and emphysema).   Heart attack and chest pain due to the heart not getting enough oxygen (angina).   Heart disease and peripheral blood vessel disease.   Hypertension.   Stroke.   Oral cancer (cancer of the lip, mouth, or voice  box).   Bladder cancer.   Pancreatic cancer.   Cervical cancer.   Pregnancy complications, including premature birth.   Stillbirths and smaller newborn babies, birth defects, and genetic damage to sperm.   Early menopause.   Lower estrogen level for women.   Infertility.   Facial wrinkles.   Blindness.   Increased risk of broken bones (fractures).   Senile dementia.   Stomach ulcers and internal bleeding.   Delayed wound healing and increased risk of complications during surgery. Because of secondhand smoke exposure, children of smokers have an increased risk of the following:   Sudden infant death syndrome (SIDS).   Respiratory infections.   Lung cancer.  Heart disease.   Ear infections.  WHY IS SMOKING ADDICTIVE? Nicotine is the chemical agent in tobacco that is capable of causing addiction or dependence. When you smoke and inhale, nicotine is absorbed rapidly into the bloodstream through your lungs. Both inhaled and noninhaled nicotine may be addictive.  WHAT ARE THE BENEFITS OF QUITTING?  There are many health benefits to quitting smoking. Some are:   The likelihood of developing cancer and heart disease decreases. Health improvements are seen almost immediately.   Blood pressure, pulse rate, and breathing patterns start returning to normal soon after quitting.   People who quit may see an improvement in their overall quality of life.  HOW DO YOU QUIT SMOKING? Smoking is an addiction with both physical and psychological effects, and longtime habits can be hard to change. Your health care provider can recommend:  Programs and community resources, which may include group support, education, or therapy.  Replacement products, such as patches, gum, and nasal sprays. Use these products only as directed. Do not replace cigarette smoking with electronic cigarettes (commonly called e-cigarettes). The safety of e-cigarettes is unknown, and some  may contain harmful chemicals. FOR MORE INFORMATION  American Lung Association: www.lung.org  American Cancer Society: www.cancer.org Document Released: 04/16/2004 Document Revised: 12/28/2012 Document Reviewed: 08/29/2012 [facility] Patient Information 2014 [place], [place].

## 2013-04-18 NOTE — ED Notes (Signed)
Pt up to BR without difficulty, back on monitor.  Pt report pain is now 5/10 which is very tolerable for her.  She wants to leave now.  Physician made aware.

## 2013-04-18 NOTE — ED Notes (Signed)
Pt agitated and upset with wait time; RN apologized profusely and explained that her wait should not be much longer. Pt ambulated with steady gait and A&Ox3; no acute signs of distress.

## 2013-04-18 NOTE — ED Notes (Signed)
Pt stated that her blood sugar was 79 and that staff didn't care. Pt was given apple sauce peanut butter and crackers. Pt now sitting up in bed eating crackers and peanut butter.

## 2013-04-18 NOTE — ED Notes (Signed)
Brought pt a sandwich and crackers

## 2013-04-18 NOTE — ED Notes (Signed)
Pt arrived by [facility]. Reports having non productive cough since last night but now having chest pain when she breaths and coughs. No acute distress noted at triage, ekg done.

## 2013-04-18 NOTE — ED Notes (Signed)
Pt states she has a hx of seizures and has had a seizure every day for the past 9 days

## 2013-04-19 LAB — INFLUENZA PANEL BY PCR (TYPE A & B)
H1N1FLUPCR: NOT DETECTED
INFLBPCR: NEGATIVE
Influenza A By PCR: NEGATIVE

## 2013-04-20 ENCOUNTER — Encounter (HOSPITAL_COMMUNITY): Payer: Self-pay | Admitting: Emergency Medicine

## 2013-04-20 ENCOUNTER — Emergency Department (HOSPITAL_COMMUNITY)
Admission: EM | Admit: 2013-04-20 | Discharge: 2013-04-20 | Disposition: A | Discharge status: Home / Self Care | Payer: Medicaid Other | Attending: Emergency Medicine | Admitting: Emergency Medicine

## 2013-04-20 DIAGNOSIS — Z85819 Personal history of malignant neoplasm of unspecified site of lip, oral cavity, and pharynx: Secondary | ICD-10-CM | POA: Insufficient documentation

## 2013-04-20 DIAGNOSIS — J4489 Other specified chronic obstructive pulmonary disease: Secondary | ICD-10-CM | POA: Insufficient documentation

## 2013-04-20 DIAGNOSIS — J111 Influenza due to unidentified influenza virus with other respiratory manifestations: Secondary | ICD-10-CM | POA: Insufficient documentation

## 2013-04-20 DIAGNOSIS — G40909 Epilepsy, unspecified, not intractable, without status epilepticus: Secondary | ICD-10-CM | POA: Insufficient documentation

## 2013-04-20 DIAGNOSIS — Z90710 Acquired absence of both cervix and uterus: Secondary | ICD-10-CM | POA: Insufficient documentation

## 2013-04-20 DIAGNOSIS — E119 Type 2 diabetes mellitus without complications: Secondary | ICD-10-CM | POA: Insufficient documentation

## 2013-04-20 DIAGNOSIS — F209 Schizophrenia, unspecified: Secondary | ICD-10-CM | POA: Insufficient documentation

## 2013-04-20 DIAGNOSIS — R69 Illness, unspecified: Secondary | ICD-10-CM

## 2013-04-20 DIAGNOSIS — F17200 Nicotine dependence, unspecified, uncomplicated: Secondary | ICD-10-CM | POA: Insufficient documentation

## 2013-04-20 DIAGNOSIS — IMO0002 Reserved for concepts with insufficient information to code with codable children: Secondary | ICD-10-CM | POA: Insufficient documentation

## 2013-04-20 DIAGNOSIS — Z9089 Acquired absence of other organs: Secondary | ICD-10-CM | POA: Insufficient documentation

## 2013-04-20 DIAGNOSIS — I10 Essential (primary) hypertension: Secondary | ICD-10-CM | POA: Insufficient documentation

## 2013-04-20 DIAGNOSIS — Z792 Long term (current) use of antibiotics: Secondary | ICD-10-CM | POA: Insufficient documentation

## 2013-04-20 DIAGNOSIS — Z79899 Other long term (current) drug therapy: Secondary | ICD-10-CM | POA: Insufficient documentation

## 2013-04-20 DIAGNOSIS — Z880 Allergy status to penicillin: Secondary | ICD-10-CM | POA: Insufficient documentation

## 2013-04-20 DIAGNOSIS — I2510 Atherosclerotic heart disease of native coronary artery without angina pectoris: Secondary | ICD-10-CM | POA: Insufficient documentation

## 2013-04-20 DIAGNOSIS — J449 Chronic obstructive pulmonary disease, unspecified: Secondary | ICD-10-CM | POA: Insufficient documentation

## 2013-04-20 HISTORY — DX: Malignant neoplasm of pharynx, unspecified: C14.0

## 2013-04-20 MED ORDER — IBUPROFEN 200 MG PO TABS
600.0000 mg | ORAL_TABLET | Freq: Once | ORAL | Status: AC
  Administered 2013-04-20: 600 mg via ORAL
  Filled 2013-04-20: qty 3

## 2013-04-20 MED ORDER — OSELTAMIVIR PHOSPHATE 75 MG PO CAPS: 75.0000 mg | ORAL_CAPSULE | Freq: Two times a day (BID) | ORAL | Status: DC

## 2013-04-20 NOTE — Discharge Instructions (Signed)

## 2013-04-20 NOTE — ED Notes (Signed)
Bed: QP61 Expected date:  Expected time:  Means of arrival:  Comments: EMS- bronchitis, back [pain

## 2013-04-20 NOTE — ED Provider Notes (Signed)
CSN: [id number]     Arrival date & time 04/20/13  1417 History   First MD Initiated Contact with Patient 04/20/13 1459     Chief Complaint  Patient presents with  . Neck Pain   (Consider location/radiation/quality/duration/timing/severity/associated sxs/prior Treatment) Patient is a 48 y.o. female presenting with flu symptoms. The history is provided by the patient. No language interpreter was used.  Influenza Presenting symptoms: cough, fatigue, fever, headache, myalgias and rhinorrhea   Presenting symptoms: no diarrhea, no nausea, no shortness of breath, no sore throat and no vomiting   Cough:    Cough characteristics:  Dry   Sputum characteristics:  Nondescript   Severity:  Moderate   Duration:  2 days   Timing:  Intermittent   Progression:  Waxing and waning   Chronicity:  New Fever:    Duration:  2 days   Timing:  Intermittent   Max temp PTA (F):  101   Temp source:  Oral   Progression:  Waxing and waning Headaches:    Severity:  Moderate   Duration:  2 days   Timing:  Intermittent   Progression:  Waxing and waning   Chronicity:  New Myalgias:    Location:  Generalized   Quality:  Aching   Severity:  Moderate   Onset quality:  Unable to specify   Duration:  2 days   Timing:  Constant   Progression:  Worsening Severity:  Moderate Duration:  2 days Progression:  Unchanged Chronicity:  New Relieved by:  Nothing Worsened by:  Nothing tried Ineffective treatments:  None tried Associated symptoms: chills, decreased appetite, decreased physical activity and nasal congestion   Associated symptoms: no mental status change, no neck stiffness and no witnessed syncope   Risk factors: sick contacts     Past Medical History  Diagnosis Date  . Hypertension   . Seizures   . COPD (chronic obstructive pulmonary disease)   . Diabetes mellitus without complication   . Cancer throat  . Sleep apnea   . Coronary artery disease   . Schizophrenia   . Migraine   . Addiction  to drug   . Throat cancer    Past Surgical History  Procedure Laterality Date  . Abdominal hysterectomy    . Appendectomy    . Cholecystectomy    . Eye surgery    . Cesarean section    . Knee surgery     Family History  Problem Relation Age of Onset  . Cancer Father    History  Substance Use Topics  . Smoking status: Current Every Day Smoker -- 0.50 packs/day for 30 years    Types: Cigarettes  . Smokeless tobacco: Never Used  . Alcohol Use: No   OB History   Grav Para Term Preterm Abortions TAB SAB Ect Mult Living                 Review of Systems  Constitutional: Positive for fever, chills, fatigue and decreased appetite. Negative for diaphoresis, activity change and appetite change.  HENT: Positive for congestion and rhinorrhea. Negative for facial swelling and sore throat.   Eyes: Negative for photophobia and discharge.  Respiratory: Positive for cough. Negative for chest tightness and shortness of breath.   Cardiovascular: Negative for chest pain, palpitations and leg swelling.  Gastrointestinal: Negative for nausea, vomiting, abdominal pain and diarrhea.  Endocrine: Negative for polydipsia and polyuria.  Genitourinary: Negative for dysuria, frequency, difficulty urinating and pelvic pain.  Musculoskeletal: Positive for myalgias and neck  pain. Negative for arthralgias, back pain and neck stiffness.  Skin: Negative for color change and wound.  Allergic/Immunologic: Negative for immunocompromised state.  Neurological: Positive for headaches. Negative for facial asymmetry, weakness and numbness.  Hematological: Does not bruise/bleed easily.  Psychiatric/Behavioral: Negative for confusion and agitation.    Allergies  Bee venom; Aspirin; Other; Penicillins; Toradol; and Vancomycin  Home Medications   Current Outpatient Rx  Name  Route  Sig  Dispense  Refill  . divalproex (DEPAKOTE) 500 MG DR tablet   Oral   Take 1,000 mg by mouth 2 (two) times daily.         Marland Kitchen  FLUoxetine (PROZAC) 20 MG capsule   Oral   Take 20 mg by mouth daily.         . Lurasidone HCl (LATUDA) 20 MG TABS   Oral   Take 20 mg by mouth daily.         . nitroGLYCERIN (NITROSTAT) 0.4 MG SL tablet   Sublingual   Place 0.4 mg under the tongue every 5 (five) minutes as needed for chest pain.         . Vitamin D, Ergocalciferol, (DRISDOL) 50000 UNITS CAPS capsule   Oral   Take 50,000 Units by mouth every 7 (seven) days. Takes on Monday         . levofloxacin (LEVAQUIN) 750 MG tablet   Oral   Take 750 mg by mouth daily. X 7 days         . oseltamivir (TAMIFLU) 75 MG capsule   Oral   Take 1 capsule (75 mg total) by mouth every 12 (twelve) hours.   10 capsule   0   . predniSONE (DELTASONE) 20 MG tablet   Oral   Take 1 tablet (20 mg total) by mouth 2 (two) times daily.   10 tablet   0    BP 123/81  Pulse 72  Temp(Src) 99.5 F (37.5 C) (Oral)  Resp 16  SpO2 96% Physical Exam  Constitutional: She is oriented to person, place, and time. She appears well-developed and well-nourished. No distress.  HENT:  Head: Normocephalic and atraumatic.  Mouth/Throat: No oropharyngeal exudate.  Eyes: Pupils are equal, round, and reactive to light.  Neck: Normal range of motion. Neck supple.  Cardiovascular: Normal rate, regular rhythm and normal heart sounds.  Exam reveals no gallop and no friction rub.   No murmur heard. Pulmonary/Chest: Effort normal and breath sounds normal. No respiratory distress. She has no wheezes. She has no rales.  Abdominal: Soft. Bowel sounds are normal. She exhibits no distension and no mass. There is no tenderness. There is no rebound and no guarding.  Musculoskeletal: Normal range of motion. She exhibits no edema and no tenderness.  Neurological: She is alert and oriented to person, place, and time.  Skin: Skin is warm and dry.  Psychiatric: She has a normal mood and affect.    ED Course  Procedures (including critical care time) Labs  Review Labs Reviewed - No data to display Imaging Review No results found.  EKG Interpretation   None       MDM   1. Influenza-like illness    SUBJECTIVE:  Carol Hughes is a 48 y.o. female who present complaining of flu-like symptoms: fevers, chills, myalgias, congestion,and dry cough for 2 days. She was seen 2 days ago, but was not having body aches at that point, was diagnosed w/ bronchitis and given prednisone & levaquin which she has not filled.  She  has also not taken anything for body aches or fever. No n/v d/a, urinary symptoms.   OBJECTIVE: Appears moderately ill but not toxic; temperature as noted in vitals. Ears normal. Throat and pharynx normal.  Neck supple, nml ROM. Sinuses non tender. The chest is clear. +ttp multiple muscle groups including traps, paraspinal muscles, lateral neck musculature, deltoids, and thighs.  No focal neuro findings.   ASSESSMENT: Influenza.  Given clear lung exam, nml CXR 2 days ago, doubt pna. Also doubt meningitis given non-toxic appearance, nml ROM of neck.  PLAN: Symptomatic therapy suggested: rest and OTC acetaminophen, ibuprofen. I have discussed pro's/con's of tx w/ Tamiflu.  She will fill tomorrow if covered by her insurance.     Neta Ehlers, MD 04/20/13 [phone]

## 2013-04-20 NOTE — ED Notes (Signed)
Per EMS- patient stated she was diagnosed with bronchitis 2 days ago at [facility] ED. Patient now c/o pain in the posterior neck region and states that it radiates into bilateral arms.

## 2013-05-08 ENCOUNTER — Encounter (HOSPITAL_COMMUNITY): Payer: Self-pay | Admitting: Emergency Medicine

## 2013-05-08 ENCOUNTER — Emergency Department (HOSPITAL_COMMUNITY)
Admission: EM | Admit: 2013-05-08 | Discharge: 2013-05-08 | Disposition: A | Discharge status: Home / Self Care | Payer: Medicaid Other | Attending: Emergency Medicine | Admitting: Emergency Medicine

## 2013-05-08 ENCOUNTER — Emergency Department (HOSPITAL_COMMUNITY): Payer: Medicaid Other

## 2013-05-08 DIAGNOSIS — Z880 Allergy status to penicillin: Secondary | ICD-10-CM | POA: Insufficient documentation

## 2013-05-08 DIAGNOSIS — R51 Headache: Secondary | ICD-10-CM | POA: Insufficient documentation

## 2013-05-08 DIAGNOSIS — J449 Chronic obstructive pulmonary disease, unspecified: Secondary | ICD-10-CM | POA: Insufficient documentation

## 2013-05-08 DIAGNOSIS — F17200 Nicotine dependence, unspecified, uncomplicated: Secondary | ICD-10-CM | POA: Insufficient documentation

## 2013-05-08 DIAGNOSIS — Z85819 Personal history of malignant neoplasm of unspecified site of lip, oral cavity, and pharynx: Secondary | ICD-10-CM | POA: Insufficient documentation

## 2013-05-08 DIAGNOSIS — I2510 Atherosclerotic heart disease of native coronary artery without angina pectoris: Secondary | ICD-10-CM | POA: Insufficient documentation

## 2013-05-08 DIAGNOSIS — Z79899 Other long term (current) drug therapy: Secondary | ICD-10-CM | POA: Insufficient documentation

## 2013-05-08 DIAGNOSIS — J4489 Other specified chronic obstructive pulmonary disease: Secondary | ICD-10-CM | POA: Insufficient documentation

## 2013-05-08 DIAGNOSIS — E119 Type 2 diabetes mellitus without complications: Secondary | ICD-10-CM | POA: Insufficient documentation

## 2013-05-08 DIAGNOSIS — R519 Headache, unspecified: Secondary | ICD-10-CM

## 2013-05-08 DIAGNOSIS — F209 Schizophrenia, unspecified: Secondary | ICD-10-CM | POA: Insufficient documentation

## 2013-05-08 DIAGNOSIS — I10 Essential (primary) hypertension: Secondary | ICD-10-CM | POA: Insufficient documentation

## 2013-05-08 DIAGNOSIS — F22 Delusional disorders: Secondary | ICD-10-CM | POA: Insufficient documentation

## 2013-05-08 LAB — BASIC METABOLIC PANEL
BUN: 20 mg/dL (ref 6–23)
CO2: 26 mEq/L (ref 19–32)
Calcium: 8.8 mg/dL (ref 8.4–10.5)
Chloride: 100 mEq/L (ref 96–112)
Creatinine, Ser: 0.73 mg/dL (ref 0.50–1.10)
GFR calc Af Amer: >90 mL/min (ref >90)
GFR calc non Af Amer: >90 mL/min (ref >90)
Glucose, Bld: 151 mg/dL — ABNORMAL HIGH (ref 70–99)
Potassium: 4 mEq/L (ref 3.7–5.3)
Sodium: 136 mEq/L — ABNORMAL LOW (ref 137–147)

## 2013-05-08 LAB — CBC WITH DIFFERENTIAL/PLATELET
Basophils Absolute: 0 10*3/uL (ref 0.0–0.1)
Basophils Relative: 0 % (ref 0–1)
Eosinophils Absolute: 0.1 10*3/uL (ref 0.0–0.7)
Eosinophils Relative: 1 % (ref 0–5)
HCT: 36.9 % (ref 36.0–46.0)
Hemoglobin: 12.4 g/dL (ref 12.0–15.0)
Lymphocytes Relative: 42 % (ref 12–46)
Lymphs Abs: 3 10*3/uL (ref 0.7–4.0)
MCH: 30.8 pg (ref 26.0–34.0)
MCHC: 33.6 g/dL (ref 30.0–36.0)
MCV: 91.6 fL (ref 78.0–100.0)
Monocytes Absolute: 0.5 10*3/uL (ref 0.1–1.0)
Monocytes Relative: 7 % (ref 3–12)
Neutro Abs: 3.5 10*3/uL (ref 1.7–7.7)
Neutrophils Relative %: 49 % (ref 43–77)
Platelets: 182 10*3/uL (ref 150–400)
RBC: 4.03 MIL/uL (ref 3.87–5.11)
RDW: 12.6 % (ref 11.5–15.5)
WBC: 7.2 10*3/uL (ref 4.0–10.5)

## 2013-05-08 MED ORDER — DIPHENHYDRAMINE HCL 50 MG/ML IJ SOLN
25.0000 mg | Freq: Once | INTRAMUSCULAR | Status: AC
  Administered 2013-05-08: 25 mg via INTRAVENOUS
  Filled 2013-05-08: qty 1

## 2013-05-08 MED ORDER — SODIUM CHLORIDE 0.9 % IV BOLUS (SEPSIS)
1000.0000 mL | Freq: Once | INTRAVENOUS | Status: AC
  Administered 2013-05-08: 1000 mL via INTRAVENOUS

## 2013-05-08 MED ORDER — METOCLOPRAMIDE HCL 5 MG/ML IJ SOLN
10.0000 mg | Freq: Once | INTRAMUSCULAR | Status: AC
  Administered 2013-05-08: 10 mg via INTRAVENOUS
  Filled 2013-05-08: qty 2

## 2013-05-08 MED ORDER — ACETAMINOPHEN 650 MG RE SUPP: 650.0000 mg | Freq: Once | RECTAL | Status: DC

## 2013-05-08 MED ORDER — IBUPROFEN 200 MG PO TABS
600.0000 mg | ORAL_TABLET | Freq: Once | ORAL | Status: AC
  Administered 2013-05-08: 600 mg via ORAL
  Filled 2013-05-08: qty 3

## 2013-05-08 NOTE — ED Notes (Addendum)
Per EMS patient with Hx of seizures and paranoid schizophrenia reports to ED from shelter for witnessed seizure lasting approximately 5-10 minutes, pt was caught and eased to ground, did not hit head. Per EMS patient was post-ictal on EMS arrival, during which she was paranoid that someone was coming to get her and that she felt unsafe. Per EMS patient requested IV, during second seizure EMS administered normal saline IV and performed a sternal rub, immediately awakening patient from seizure. Pt c/o headache. Fiance at bedside, states patient's current mental state is her mental baseline. Pt states she has been unable to take her seizure medication.

## 2013-05-08 NOTE — Progress Notes (Signed)
     CARE MANAGEMENT ED NOTE 05/08/2013  Patient:  Carol Hughes, Carol Hughes   Account Number:  [id number]  Date Initiated:  05/08/2013  Documentation initiated by:  Jackelyn Poling  Subjective/Objective Assessment:   48 yr old [place] access pt without pcp Pt states she does not go to the pcp that was listed on her card Hx of seizures and paranoid schizophrenia reports to ED from shelter for witnessed seizure lasting approximately 5-10 minutes     Subjective/Objective Assessment Detail:   Female at bedside, Nicole Kindred, supportive 1 [facility] ED visits in last 6 months noted in [facility] records  Pt noted in ED bed thrash head from side to side mumbling she wanted "it to stop" in reference to seizure activity per Nicole Kindred interpretation  Pt reports various calls to providers offered during her last visit but no return calls from providers  Nicole Kindred overheard by CM contacting local shelter for availability He requested to return to shelter pt was at on 05/07/13  Pt voiced concern that her "depakote" did "not work" CM encouraged a consistent pcp to help her manage changes in meds to determine best seizure med for her Nicole Kindred voiced agreement     Action/Plan:   CM spoke with pt and Nicole Kindred about the importance of community follow up care from a pcp and compliance with medication Pt encouraged to contact [place] staff to change medicaid pcp Pt given list of [place] pcps   Action/Plan Detail:   pt given information for [employer] disability resources when she voiced concern about not being "heard"  all resources reviewed with Nicole Kindred   Anticipated DC Date:       Status Recommendation to Physician:   Result of Recommendation:    Other ED Services  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, radio  Other  Outpatient Services - Pt will follow up  PCP issues    Choice offered to / List presented to:            Status of service:  Completed, signed off  ED Comments:   ED Comments Detail:  CM discussed and  provided written information for self pay pcps, importance of pcp for f/u care, www.needymeds.org, discounted pharmacies and other [employer] such as financial assistance, DSS and  health department Reviewed resources for [employer] self pay pcps like [employer], family medicine at [address], [facility] family practice, general medical clinics, [facility] urgent care plus others, [facility] out patient pharmacies and housing Pt voiced understanding and appreciation of resources provided

## 2013-05-08 NOTE — Discharge Instructions (Signed)

## 2013-05-08 NOTE — ED Notes (Signed)
Bed: BP10 Expected date:  Expected time:  Means of arrival:  Comments: ems- seizure

## 2013-05-17 NOTE — ED Provider Notes (Signed)
CSN: [id number]     Arrival date & time 05/08/13  1811 History   First MD Initiated Contact with Patient 05/08/13 1824     Chief Complaint  Patient presents with  . Seizures     (Consider location/radiation/quality/duration/timing/severity/associated sxs/prior Treatment) Patient is a 48 y.o. female presenting with seizures. The history is provided by the patient, a friend and the EMS personnel.  Seizures Seizure activity on arrival: no   Seizure type:  Unable to specify Preceding symptoms: panic   Initial focality:  Unable to specify Episode characteristics: abnormal movements, confusion, disorientation and partial responsiveness   Episode characteristics: no incontinence and no tongue biting   Return to baseline: yes   Timing:  Unable to specify Recent head injury:  No recent head injuries Meds prior to arrival: normal saline.  47yF with seizure like activity shortly before arrival. Apparently pt with some paranoid thoughts then became unresponsive. Helped to ground and minimally responsive for several minutes. No generalized shaking. No incontinence or oral trauma. Hx of seizure and psychiatric disorder.  Past Medical History  Diagnosis Date  . Hypertension   . Seizures   . COPD (chronic obstructive pulmonary disease)   . Diabetes mellitus without complication   . Cancer throat  . Sleep apnea   . Coronary artery disease   . Schizophrenia   . Migraine   . Addiction to drug   . Throat cancer    Past Surgical History  Procedure Laterality Date  . Abdominal hysterectomy    . Appendectomy    . Cholecystectomy    . Eye surgery    . Cesarean section    . Knee surgery     Family History  Problem Relation Age of Onset  . Cancer Father    History  Substance Use Topics  . Smoking status: Current Every Day Smoker -- 0.50 packs/day for 30 years    Types: Cigarettes  . Smokeless tobacco: Never Used  . Alcohol Use: No   OB History   Grav Para Term Preterm Abortions TAB  SAB Ect Mult Living                 Review of Systems  Neurological: Positive for seizures.    All systems reviewed and negative, other than as noted in HPI.   Allergies  Bee venom; Aspirin; Other; Penicillins; Toradol; and Vancomycin  Home Medications   Current Outpatient Rx  Name  Route  Sig  Dispense  Refill  . ibuprofen (ADVIL,MOTRIN) 200 MG tablet   Oral   Take 600 mg by mouth every 6 (six) hours as needed for moderate pain.         . Lurasidone HCl (LATUDA) 20 MG TABS   Oral   Take 20 mg by mouth daily.         . Vitamin D, Ergocalciferol, (DRISDOL) 50000 UNITS CAPS capsule   Oral   Take 50,000 Units by mouth every 7 (seven) days. Takes on Monday         . nitroGLYCERIN (NITROSTAT) 0.4 MG SL tablet   Sublingual   Place 0.4 mg under the tongue every 5 (five) minutes as needed for chest pain.          BP 112/56  Pulse 65  Temp(Src) 98.5 F (36.9 C) (Oral)  Resp 18  SpO2 97% Physical Exam  Nursing note and vitals reviewed. Constitutional: She is oriented to person, place, and time. She appears well-developed and well-nourished. No distress.  HENT:  Head:  Normocephalic and atraumatic.  Eyes: Conjunctivae are normal. Right eye exhibits no discharge. Left eye exhibits no discharge.  Neck: Neck supple.  No nuchal rigidity  Cardiovascular: Normal rate, regular rhythm and normal heart sounds.  Exam reveals no gallop and no friction rub.   No murmur heard. Pulmonary/Chest: Effort normal and breath sounds normal. No respiratory distress.  Abdominal: Soft. She exhibits no distension. There is no tenderness.  Musculoskeletal: She exhibits no edema and no tenderness.  Neurological: She is alert and oriented to person, place, and time. No cranial nerve deficit. She exhibits normal muscle tone. Coordination normal.  Speech clear. Content appropriate.   Skin: Skin is warm and dry.  Psychiatric: She has a normal mood and affect. Her behavior is normal. Thought  content normal.    ED Course  Procedures (including critical care time) Labs Review Labs Reviewed  BASIC METABOLIC PANEL - Abnormal; Notable for the following:    Sodium 136 (*)    Glucose, Bld 151 (*)    All other components within normal limits  CBC WITH DIFFERENTIAL   Imaging Review No results found.  EKG Interpretation    Date/Time:  Monday May 08 2013 18:37:35 EST Ventricular Rate:  54 PR Interval:  158 QRS Duration: 95 QT Interval:  431 QTC Calculation: 408 R Axis:   72 Text Interpretation:  Sinus rhythm ED PHYSICIAN INTERPRETATION AVAILABLE IN [facility] Confirmed by TEST, RECORD ([id number]) on 05/10/2013 7:13:47 AM            MDM   Final diagnoses:  Headache    47yF with HA. "Seizures" in ED not consistent with epileptiform activity. Lytes fine. Back to baseline. HA improved. Nonfocal neuro exam.     Virgel Manifold, MD 05/17/13 [phone]

## 2013-05-29 ENCOUNTER — Emergency Department (HOSPITAL_COMMUNITY)
Admission: EM | Admit: 2013-05-29 | Discharge: 2013-05-30 | Disposition: A | Discharge status: Home / Self Care | Payer: Medicaid Other | Attending: Emergency Medicine | Admitting: Emergency Medicine

## 2013-05-29 ENCOUNTER — Encounter (HOSPITAL_COMMUNITY): Payer: Self-pay | Admitting: Emergency Medicine

## 2013-05-29 DIAGNOSIS — G40909 Epilepsy, unspecified, not intractable, without status epilepticus: Secondary | ICD-10-CM | POA: Insufficient documentation

## 2013-05-29 DIAGNOSIS — J4489 Other specified chronic obstructive pulmonary disease: Secondary | ICD-10-CM | POA: Insufficient documentation

## 2013-05-29 DIAGNOSIS — Z8659 Personal history of other mental and behavioral disorders: Secondary | ICD-10-CM | POA: Insufficient documentation

## 2013-05-29 DIAGNOSIS — E119 Type 2 diabetes mellitus without complications: Secondary | ICD-10-CM | POA: Insufficient documentation

## 2013-05-29 DIAGNOSIS — Z3202 Encounter for pregnancy test, result negative: Secondary | ICD-10-CM | POA: Insufficient documentation

## 2013-05-29 DIAGNOSIS — I2510 Atherosclerotic heart disease of native coronary artery without angina pectoris: Secondary | ICD-10-CM | POA: Insufficient documentation

## 2013-05-29 DIAGNOSIS — F17200 Nicotine dependence, unspecified, uncomplicated: Secondary | ICD-10-CM | POA: Insufficient documentation

## 2013-05-29 DIAGNOSIS — J449 Chronic obstructive pulmonary disease, unspecified: Secondary | ICD-10-CM | POA: Insufficient documentation

## 2013-05-29 DIAGNOSIS — Z85819 Personal history of malignant neoplasm of unspecified site of lip, oral cavity, and pharynx: Secondary | ICD-10-CM | POA: Insufficient documentation

## 2013-05-29 DIAGNOSIS — I10 Essential (primary) hypertension: Secondary | ICD-10-CM | POA: Insufficient documentation

## 2013-05-29 LAB — BASIC METABOLIC PANEL
BUN: 15 mg/dL (ref 6–23)
CO2: 30 meq/L (ref 19–32)
CREATININE: 0.65 mg/dL (ref 0.50–1.10)
Calcium: 9.3 mg/dL (ref 8.4–10.5)
Chloride: 102 mEq/L (ref 96–112)
GFR calc Af Amer: >90 mL/min (ref >90)
GFR calc non Af Amer: >90 mL/min (ref >90)
GLUCOSE: 128 mg/dL — AB (ref 70–99)
Potassium: 4.1 mEq/L (ref 3.7–5.3)
Sodium: 142 mEq/L (ref 137–147)

## 2013-05-29 LAB — URINALYSIS, ROUTINE W REFLEX MICROSCOPIC
BILIRUBIN URINE: NEGATIVE
GLUCOSE, UA: NEGATIVE mg/dL
Hgb urine dipstick: NEGATIVE
Ketones, ur: NEGATIVE mg/dL
Leukocytes, UA: NEGATIVE
Nitrite: NEGATIVE
PH: 5.5 (ref 5.0–8.0)
PROTEIN: NEGATIVE mg/dL
Specific Gravity, Urine: 1.026 (ref 1.005–1.030)
Urobilinogen, UA: 0.2 mg/dL (ref 0.0–1.0)

## 2013-05-29 LAB — ETHANOL

## 2013-05-29 LAB — VALPROIC ACID LEVEL

## 2013-05-29 LAB — RAPID URINE DRUG SCREEN, HOSP PERFORMED
Amphetamines: NOT DETECTED
Barbiturates: NOT DETECTED
Benzodiazepines: NOT DETECTED
COCAINE: NOT DETECTED
Opiates: NOT DETECTED
TETRAHYDROCANNABINOL: NOT DETECTED

## 2013-05-29 LAB — CBC WITH DIFFERENTIAL/PLATELET
BASOS ABS: 0 10*3/uL (ref 0.0–0.1)
Basophils Relative: 0 % (ref 0–1)
EOS PCT: 1 % (ref 0–5)
Eosinophils Absolute: 0.1 10*3/uL (ref 0.0–0.7)
HCT: 38.9 % (ref 36.0–46.0)
Hemoglobin: 13 g/dL (ref 12.0–15.0)
LYMPHS PCT: 40 % (ref 12–46)
Lymphs Abs: 3.9 10*3/uL (ref 0.7–4.0)
MCH: 30.8 pg (ref 26.0–34.0)
MCHC: 33.4 g/dL (ref 30.0–36.0)
MCV: 92.2 fL (ref 78.0–100.0)
Monocytes Absolute: 0.8 10*3/uL (ref 0.1–1.0)
Monocytes Relative: 8 % (ref 3–12)
Neutro Abs: 5.1 10*3/uL (ref 1.7–7.7)
Neutrophils Relative %: 52 % (ref 43–77)
PLATELETS: 201 10*3/uL (ref 150–400)
RBC: 4.22 MIL/uL (ref 3.87–5.11)
RDW: 13 % (ref 11.5–15.5)
WBC: 9.9 10*3/uL (ref 4.0–10.5)

## 2013-05-29 LAB — CBG MONITORING, ED: Glucose-Capillary: 145 mg/dL — ABNORMAL HIGH (ref 70–99)

## 2013-05-29 LAB — POC URINE PREG, ED: Preg Test, Ur: NEGATIVE

## 2013-05-29 MED ORDER — METOCLOPRAMIDE HCL 5 MG/ML IJ SOLN
10.0000 mg | Freq: Once | INTRAMUSCULAR | Status: AC
  Administered 2013-05-29: 10 mg via INTRAVENOUS
  Filled 2013-05-29: qty 2

## 2013-05-29 MED ORDER — DIPHENHYDRAMINE HCL 50 MG/ML IJ SOLN
25.0000 mg | Freq: Once | INTRAMUSCULAR | Status: AC
  Administered 2013-05-29: 25 mg via INTRAVENOUS
  Filled 2013-05-29: qty 1

## 2013-05-29 MED ORDER — SODIUM CHLORIDE 0.9 % IV SOLN
1200.0000 mg | Freq: Once | INTRAVENOUS | Status: AC
  Administered 2013-05-29: 1200 mg via INTRAVENOUS
  Filled 2013-05-29: qty 24

## 2013-05-29 MED ORDER — DEXAMETHASONE SODIUM PHOSPHATE 10 MG/ML IJ SOLN
10.0000 mg | Freq: Once | INTRAMUSCULAR | Status: AC
  Administered 2013-05-29: 10 mg via INTRAVENOUS
  Filled 2013-05-29: qty 1

## 2013-05-29 NOTE — ED Provider Notes (Signed)
CSN: [id number]     Arrival date & time 05/29/13  2008 History   First MD Initiated Contact with Patient 05/29/13 2020     Chief Complaint  Patient presents with  . Hyperglycemia   HPI  History provided by the patient and significant other. The patient is a 48 year old female with history of hypertension, diabetes, COPD, CAD, bipolar disorder, seizure disorder and schizophrenia who presents to the emergency room with a possible seizure. Patient is a poor historian. She states that she is not feeling well but is unable to specify how she feels on what is bothering her. Her boyfriend states that she seemed to have seizure activity with contractures and movements at home just before having dinner. He states the patient has not been on any of her normal medications due to poor followup and financial reasons. Patient states that she thought perhaps her blood sugar was elevated because at times in the past her seizure problem has been related to her blood sugar. The patient was brought by EMS and had 2 reported normal blood sugar checks. No other treatments were given. Patient denies any injuries. There was no urinary or fecal incontinence. No head injury or headache. She does complain of some dizziness. She states she feels as if the room and people are moving and spinning. Denies any other complaints. No recent illness or fever.    Past Medical History  Diagnosis Date  . Hypertension   . Seizures   . COPD (chronic obstructive pulmonary disease)   . Diabetes mellitus without complication   . Cancer throat  . Sleep apnea   . Coronary artery disease   . Schizophrenia   . Migraine   . Addiction to drug   . Throat cancer    Past Surgical History  Procedure Laterality Date  . Abdominal hysterectomy    . Appendectomy    . Cholecystectomy    . Eye surgery    . Cesarean section    . Knee surgery     Family History  Problem Relation Age of Onset  . Cancer Father    History  Substance Use  Topics  . Smoking status: Current Every Day Smoker -- 0.50 packs/day for 30 years    Types: Cigarettes  . Smokeless tobacco: Never Used  . Alcohol Use: No   OB History   Grav Para Term Preterm Abortions TAB SAB Ect Mult Living                 Review of Systems  Constitutional: Negative for fever.  Gastrointestinal: Negative for vomiting and diarrhea.  Neurological: Positive for dizziness and seizures. Negative for headaches.  Psychiatric/Behavioral: Positive for confusion.  All other systems reviewed and are negative.      Allergies  Aspirin; Bee venom; Other; Toradol; Penicillins; and Vancomycin  Home Medications   Current Outpatient Rx  Name  Route  Sig  Dispense  Refill  . diphenhydrAMINE (BENADRYL) 25 MG tablet   Oral   Take 25 mg by mouth every 6 (six) hours as needed for allergies or sleep.          BP 109/44  Pulse 64  Temp(Src) 99.1 F (37.3 C) (Oral)  Resp 11  SpO2 99% Physical Exam  Nursing note and vitals reviewed. Constitutional: She is oriented to person, place, and time. She appears well-developed and well-nourished. No distress.  HENT:  Head: Normocephalic and atraumatic.  Eyes: Conjunctivae and EOM are normal. Pupils are equal, round, and reactive to light.  No nystagmus  Neck: Normal range of motion. Neck supple.  No meningeal signs  Cardiovascular: Normal rate and regular rhythm.   No murmur heard. Pulmonary/Chest: Effort normal and breath sounds normal. No respiratory distress. She has no wheezes. She has no rales.  Abdominal: Soft. There is no tenderness. There is no rigidity, no rebound, no guarding, no CVA tenderness and no tenderness at McBurney's point.  Musculoskeletal: Normal range of motion. She exhibits no edema and no tenderness.  Neurological: She is alert and oriented to person, place, and time. She has normal strength. No cranial nerve deficit or sensory deficit. Gait normal.  Skin: Skin is warm and dry. No rash noted.   Psychiatric: She has a normal mood and affect.  Patient talking in a childlike manner.    ED Course  Procedures   DIAGNOSTIC STUDIES: Oxygen Saturation is 100% on air.    COORDINATION OF CARE:  Nursing notes reviewed. Vital signs reviewed. Initial pt interview and examination performed.   8:30 PM patient seen and evaluated. Patient awake and alert. Patient speaking in a childish manner. Has a vague complaint of just not feeling well. Significant other reports patient may have had seizure-like activity prior to arrival. Patient does have history of seizures as well as schizophrenia and bipolar disorder. She has not been on her medications for over a month due to poor followup and financial reasons. Discussed work up plan with pt at bedside, which includes lab testing. Pt agrees with plan.  Patient has been resting well. She has been feeling improved while resting in the emergency department. Her lab testing is unremarkable. She has not had any seizure-like activity. Her partner states that she is much more at her baseline. She has been giving dose of Depakote as well as some medicine for mild headache. Patient had recent CT scan without any concerning abnormalities. No recent history of head injuries or trauma. She has known reported seizure disorder. At this time I suspect her symptoms may be more related to her mental health history. She denies any SI or HI or any hallucinations. I will plan to give her referrals to followup with her general healthcare as well as some prescriptions for her Depakote. Patient agrees with plan.   Results for orders placed during the hospital encounter of 05/29/13  CBC WITH DIFFERENTIAL      Result Value Ref Range   WBC 9.9  4.0 - 10.5 K/uL   RBC 4.22  3.87 - 5.11 MIL/uL   Hemoglobin 13.0  12.0 - 15.0 g/dL   HCT 38.9  36.0 - 46.0 %   MCV 92.2  78.0 - 100.0 fL   MCH 30.8  26.0 - 34.0 pg   MCHC 33.4  30.0 - 36.0 g/dL   RDW 13.0  11.5 - 15.5 %   Platelets  201  150 - 400 K/uL   Neutrophils Relative % 52  43 - 77 %   Neutro Abs 5.1  1.7 - 7.7 K/uL   Lymphocytes Relative 40  12 - 46 %   Lymphs Abs 3.9  0.7 - 4.0 K/uL   Monocytes Relative 8  3 - 12 %   Monocytes Absolute 0.8  0.1 - 1.0 K/uL   Eosinophils Relative 1  0 - 5 %   Eosinophils Absolute 0.1  0.0 - 0.7 K/uL   Basophils Relative 0  0 - 1 %   Basophils Absolute 0.0  0.0 - 0.1 K/uL  BASIC METABOLIC PANEL  Result Value Ref Range   Sodium 142  137 - 147 mEq/L   Potassium 4.1  3.7 - 5.3 mEq/L   Chloride 102  96 - 112 mEq/L   CO2 30  19 - 32 mEq/L   Glucose, Bld 128 (*) 70 - 99 mg/dL   BUN 15  6 - 23 mg/dL   Creatinine, Ser 0.65  0.50 - 1.10 mg/dL   Calcium 9.3  8.4 - 10.5 mg/dL   GFR calc non Af Amer >90  >90 mL/min   GFR calc Af Amer >90  >90 mL/min  VALPROIC ACID LEVEL      Result Value Ref Range   Valproic Acid Lvl <10.0 (*) 50.0 - 100.0 ug/mL  URINALYSIS, ROUTINE W REFLEX MICROSCOPIC      Result Value Ref Range   Color, Urine YELLOW  YELLOW   APPearance CLEAR  CLEAR   Specific Gravity, Urine 1.026  1.005 - 1.030   pH 5.5  5.0 - 8.0   Glucose, UA NEGATIVE  NEGATIVE mg/dL   Hgb urine dipstick NEGATIVE  NEGATIVE   Bilirubin Urine NEGATIVE  NEGATIVE   Ketones, ur NEGATIVE  NEGATIVE mg/dL   Protein, ur NEGATIVE  NEGATIVE mg/dL   Urobilinogen, UA 0.2  0.0 - 1.0 mg/dL   Nitrite NEGATIVE  NEGATIVE   Leukocytes, UA NEGATIVE  NEGATIVE  URINE RAPID DRUG SCREEN (HOSP PERFORMED)      Result Value Ref Range   Opiates NONE DETECTED  NONE DETECTED   Cocaine NONE DETECTED  NONE DETECTED   Benzodiazepines NONE DETECTED  NONE DETECTED   Amphetamines NONE DETECTED  NONE DETECTED   Tetrahydrocannabinol NONE DETECTED  NONE DETECTED   Barbiturates NONE DETECTED  NONE DETECTED  ETHANOL      Result Value Ref Range   Alcohol, Ethyl (B) <11  0 - 11 mg/dL  POC URINE PREG, ED      Result Value Ref Range   Preg Test, Ur NEGATIVE  NEGATIVE  CBG MONITORING, ED      Result Value Ref Range    Glucose-Capillary 145 (*) 70 - 99 mg/dL  CBG MONITORING, ED      Result Value Ref Range   Glucose-Capillary 122 (*) 70 - 99 mg/dL      MDM   Final diagnoses:  Seizure disorder        Martie Lee, PA-C 05/30/13 2323

## 2013-05-29 NOTE — ED Notes (Addendum)
Pt reports to the ED for eval of multiple seizures, strange behaviors, and fluctuating blood sugars. Pt has hx of type 2 diabetes and says that they think her CBGs are what's causing the seizures. EMS reports they took her CBG 2 times and it was 134 mg/dl and then 184 mg/dl. Pt also reports dizziness. VSS en route. Pt having AMS as well. Pt alert but disoriented to person only. No neuro deficits noted.

## 2013-05-29 NOTE — Progress Notes (Signed)
MEDICATION RELATED CONSULT NOTE - INITIAL   Pharmacy Consult for phenytoin loading dose Indication: seizures  Allergies  Allergen Reactions  . Aspirin Other (See Comments)    Adult strength causes "burning", baby is ok.   . Bee Venom Anaphylaxis  . Other Other (See Comments)    Tomatoes, onions makes her "can't breathe"  . Toradol [Ketorolac Tromethamine] Itching and Nausea And Vomiting  . Penicillins Other (See Comments)    Childhood reaction, unknown  . Vancomycin Itching    Patient Measurements:weight 96 kg, height 62 inches   Adjusted Body Weight: 68 kg  Vital Signs: Temp: 99.1 F (37.3 C) (03/09 2015) Temp src: Oral (03/09 2015) BP: 101/51 mmHg (03/09 2208) Pulse Rate: 64 (03/09 2015) Intake/Output from previous day:   Intake/Output from this shift:    Labs:  Recent Labs  05/29/13 2039  WBC 9.9  HGB 13.0  HCT 38.9  PLT 201  CREATININE 0.65   The CrCl is unknown because both a height and weight (above a minimum accepted value) are required for this calculation.   Microbiology: No results found for this or any previous visit (from the past 720 hour(s)).  Medical History: Past Medical History  Diagnosis Date  . Hypertension   . Seizures   . COPD (chronic obstructive pulmonary disease)   . Diabetes mellitus without complication   . Cancer throat  . Sleep apnea   . Coronary artery disease   . Schizophrenia   . Migraine   . Addiction to drug   . Throat cancer     Medications:  See med rec  Assessment: Patient is a 48 y.o F with hx seizures.  She presented to the ED today for suspected seizures. She stated that she was on depakote at one point but stopped taking it at least 3 months ago.  Patient stated that depakote "does not work."   Goal of Therapy:  Phenytoin level 10-20  Plan:  1) Dilantin 1200mg  IV x1  Oneill Bais P 05/29/2013,10:33 PM

## 2013-05-30 LAB — CBG MONITORING, ED: Glucose-Capillary: 122 mg/dL — ABNORMAL HIGH (ref 70–99)

## 2013-05-30 MED ORDER — DIVALPROEX SODIUM 500 MG PO DR TAB: 1000.0000 mg | DELAYED_RELEASE_TABLET | Freq: Two times a day (BID) | ORAL | Status: DC

## 2013-05-30 MED ORDER — DIVALPROEX SODIUM 250 MG PO DR TAB
1000.0000 mg | DELAYED_RELEASE_TABLET | Freq: Once | ORAL | Status: AC
  Administered 2013-05-30: 1000 mg via ORAL
  Filled 2013-05-30: qty 4

## 2013-05-30 NOTE — ED Notes (Addendum)
Pt called out. States that she feels "weird and numb/weak all over". No sensory loss noted and good strength. Pt keeps closing eyes rotating head back and forth. Pt easily stimulated. Dilantin paused.

## 2013-05-30 NOTE — Discharge Instructions (Signed)
Please follow up with a primary care provider for continued evaluation and treatment.  Return for any changing or worsening symptoms.    Seizure, Adult A seizure is abnormal electrical activity in the brain. Seizures usually last from 30 seconds to 2 minutes. There are various types of seizures. Before a seizure, you may have a warning sensation (aura) that a seizure is about to occur. An aura may include the following symptoms:   Fear or anxiety.  Nausea.  Feeling like the room is spinning (vertigo).  Vision changes, such as seeing flashing lights or spots. Common symptoms during a seizure include:  A change in attention or behavior (altered mental status).  Convulsions with rhythmic jerking movements.  Drooling.  Rapid eye movements.  Grunting.  Loss of bladder and bowel control.  Bitter taste in the mouth.  Tongue biting. After a seizure, you may feel confused and sleepy. You may also have an injury resulting from convulsions during the seizure. HOME CARE INSTRUCTIONS   If you are given medicines, take them exactly as prescribed by your health care provider.  Keep all follow-up appointments as directed by your health care provider.  Do not swim or drive or engage in risky activity during which a seizure could cause further injury to you or others until your health care provider says it is OK.  Get adequate rest.  Teach friends and family what to do if you have a seizure. They should:  Lay you on the ground to prevent a fall.  Put a cushion under your head.  Loosen any tight clothing around your neck.  Turn you on your side. If vomiting occurs, this helps keep your airway clear.  Stay with you until you recover.  Know whether or not you need emergency care. SEEK IMMEDIATE MEDICAL CARE IF:  The seizure lasts longer than 5 minutes.  The seizure is severe or you do not wake up immediately after the seizure.  You have an altered mental status after the  seizure.  You are having more frequent or worsening seizures. Someone should drive you to the emergency department or call local emergency services (911 in [place] MAKE SURE YOU:  Understand these instructions.  Will watch your condition.  Will get help right away if you are not doing well or get worse. Document Released: 03/06/2000 Document Revised: 12/28/2012 Document Reviewed: 10/19/2012 [facility] Patient Information 2014 [place].

## 2013-05-30 NOTE — ED Notes (Signed)
Pt in nad.

## 2013-06-03 NOTE — ED Provider Notes (Signed)
Medical screening examination/treatment/procedure(s) were performed by non-physician practitioner and as supervising physician I was immediately available for consultation/collaboration.   EKG Interpretation None       Carol Hughes. Alvino Chapel, MD 06/03/13 [phone]

## 2013-06-29 ENCOUNTER — Encounter (HOSPITAL_COMMUNITY): Payer: Self-pay | Admitting: Emergency Medicine

## 2013-06-29 ENCOUNTER — Emergency Department (HOSPITAL_COMMUNITY)
Admission: EM | Admit: 2013-06-29 | Discharge: 2013-06-30 | Disposition: A | Discharge status: Home / Self Care | Payer: Medicaid Other | Attending: Emergency Medicine | Admitting: Emergency Medicine

## 2013-06-29 DIAGNOSIS — Z880 Allergy status to penicillin: Secondary | ICD-10-CM | POA: Insufficient documentation

## 2013-06-29 DIAGNOSIS — J4489 Other specified chronic obstructive pulmonary disease: Secondary | ICD-10-CM | POA: Insufficient documentation

## 2013-06-29 DIAGNOSIS — F209 Schizophrenia, unspecified: Secondary | ICD-10-CM | POA: Insufficient documentation

## 2013-06-29 DIAGNOSIS — J449 Chronic obstructive pulmonary disease, unspecified: Secondary | ICD-10-CM | POA: Insufficient documentation

## 2013-06-29 DIAGNOSIS — S0083XA Contusion of other part of head, initial encounter: Principal | ICD-10-CM

## 2013-06-29 DIAGNOSIS — S0003XA Contusion of scalp, initial encounter: Secondary | ICD-10-CM | POA: Insufficient documentation

## 2013-06-29 DIAGNOSIS — I2510 Atherosclerotic heart disease of native coronary artery without angina pectoris: Secondary | ICD-10-CM | POA: Insufficient documentation

## 2013-06-29 DIAGNOSIS — Z85819 Personal history of malignant neoplasm of unspecified site of lip, oral cavity, and pharynx: Secondary | ICD-10-CM | POA: Insufficient documentation

## 2013-06-29 DIAGNOSIS — S1093XA Contusion of unspecified part of neck, initial encounter: Principal | ICD-10-CM

## 2013-06-29 DIAGNOSIS — I10 Essential (primary) hypertension: Secondary | ICD-10-CM | POA: Insufficient documentation

## 2013-06-29 DIAGNOSIS — F17200 Nicotine dependence, unspecified, uncomplicated: Secondary | ICD-10-CM | POA: Insufficient documentation

## 2013-06-29 DIAGNOSIS — M542 Cervicalgia: Secondary | ICD-10-CM

## 2013-06-29 DIAGNOSIS — G40909 Epilepsy, unspecified, not intractable, without status epilepticus: Secondary | ICD-10-CM | POA: Insufficient documentation

## 2013-06-29 DIAGNOSIS — Z79899 Other long term (current) drug therapy: Secondary | ICD-10-CM | POA: Insufficient documentation

## 2013-06-29 DIAGNOSIS — E119 Type 2 diabetes mellitus without complications: Secondary | ICD-10-CM | POA: Insufficient documentation

## 2013-06-29 DIAGNOSIS — G43909 Migraine, unspecified, not intractable, without status migrainosus: Secondary | ICD-10-CM | POA: Insufficient documentation

## 2013-06-29 LAB — CBG MONITORING, ED: Glucose-Capillary: 127 mg/dL — ABNORMAL HIGH (ref 70–99)
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
## 2013-06-30 ENCOUNTER — Emergency Department (HOSPITAL_COMMUNITY): Payer: Medicaid Other

## 2013-06-30 MED ORDER — MORPHINE SULFATE 4 MG/ML IJ SOLN
4.0000 mg | Freq: Once | INTRAMUSCULAR | Status: AC
  Administered 2013-06-30: 4 mg via INTRAVENOUS
  Filled 2013-06-30: qty 1

## 2013-06-30 MED ORDER — OXYCODONE-ACETAMINOPHEN 5-325 MG PO TABS: 1.0000 | ORAL_TABLET | ORAL | Status: DC | PRN
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
## 2013-06-30 NOTE — ED Notes (Signed)
Patient transported to CT 
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
## 2013-08-21 ENCOUNTER — Encounter (HOSPITAL_COMMUNITY): Payer: Self-pay | Admitting: Emergency Medicine

## 2013-08-21 ENCOUNTER — Emergency Department (HOSPITAL_COMMUNITY)
Admission: EM | Admit: 2013-08-21 | Discharge: 2013-08-22 | Disposition: A | Discharge status: Home / Self Care | Payer: Medicaid Other | Attending: Emergency Medicine | Admitting: Emergency Medicine

## 2013-08-21 ENCOUNTER — Emergency Department (HOSPITAL_COMMUNITY): Payer: Medicaid Other

## 2013-08-21 DIAGNOSIS — J029 Acute pharyngitis, unspecified: Secondary | ICD-10-CM | POA: Insufficient documentation

## 2013-08-21 DIAGNOSIS — J449 Chronic obstructive pulmonary disease, unspecified: Secondary | ICD-10-CM | POA: Insufficient documentation

## 2013-08-21 DIAGNOSIS — G40909 Epilepsy, unspecified, not intractable, without status epilepticus: Secondary | ICD-10-CM | POA: Insufficient documentation

## 2013-08-21 DIAGNOSIS — Z79899 Other long term (current) drug therapy: Secondary | ICD-10-CM | POA: Insufficient documentation

## 2013-08-21 DIAGNOSIS — G43909 Migraine, unspecified, not intractable, without status migrainosus: Secondary | ICD-10-CM | POA: Insufficient documentation

## 2013-08-21 DIAGNOSIS — F17200 Nicotine dependence, unspecified, uncomplicated: Secondary | ICD-10-CM | POA: Insufficient documentation

## 2013-08-21 DIAGNOSIS — G4730 Sleep apnea, unspecified: Secondary | ICD-10-CM | POA: Insufficient documentation

## 2013-08-21 DIAGNOSIS — E119 Type 2 diabetes mellitus without complications: Secondary | ICD-10-CM | POA: Insufficient documentation

## 2013-08-21 DIAGNOSIS — R5383 Other fatigue: Secondary | ICD-10-CM

## 2013-08-21 DIAGNOSIS — H938X9 Other specified disorders of ear, unspecified ear: Secondary | ICD-10-CM | POA: Insufficient documentation

## 2013-08-21 DIAGNOSIS — I2510 Atherosclerotic heart disease of native coronary artery without angina pectoris: Secondary | ICD-10-CM | POA: Insufficient documentation

## 2013-08-21 DIAGNOSIS — Z8659 Personal history of other mental and behavioral disorders: Secondary | ICD-10-CM | POA: Insufficient documentation

## 2013-08-21 DIAGNOSIS — J4489 Other specified chronic obstructive pulmonary disease: Secondary | ICD-10-CM | POA: Insufficient documentation

## 2013-08-21 DIAGNOSIS — R5381 Other malaise: Secondary | ICD-10-CM | POA: Insufficient documentation

## 2013-08-21 DIAGNOSIS — J069 Acute upper respiratory infection, unspecified: Secondary | ICD-10-CM | POA: Insufficient documentation

## 2013-08-21 DIAGNOSIS — Z85819 Personal history of malignant neoplasm of unspecified site of lip, oral cavity, and pharynx: Secondary | ICD-10-CM | POA: Insufficient documentation

## 2013-08-21 DIAGNOSIS — R630 Anorexia: Secondary | ICD-10-CM | POA: Insufficient documentation

## 2013-08-21 DIAGNOSIS — I10 Essential (primary) hypertension: Secondary | ICD-10-CM | POA: Insufficient documentation

## 2013-08-21 MED ORDER — IPRATROPIUM-ALBUTEROL 0.5-2.5 (3) MG/3ML IN SOLN
3.0000 mL | Freq: Once | RESPIRATORY_TRACT | Status: AC
  Administered 2013-08-22: 3 mL via RESPIRATORY_TRACT
  Filled 2013-08-21: qty 3

## 2013-08-21 NOTE — ED Notes (Addendum)
Pt reports bilateral ear "fullness", NP cough and nasal congestion x 3 days. Denies CP, SOB. Denies ear drainage. Denies HA, but reports pressure to ears. Denies recent fever/chills. Pt is AO x4.

## 2013-08-21 NOTE — ED Provider Notes (Signed)
CSN: [id number]     Arrival date & time 08/21/13  2016 History   First MD Initiated Contact with Patient 08/21/13 2339     Chief Complaint  Patient presents with  . Ear Fullness  . Cough     (Consider location/radiation/quality/duration/timing/severity/associated sxs/prior Treatment) HPI Comments: Patient presents with a two-day history of ear fullness, nonproductive cough, sore throat and chills. No fever. No shortness of breath or chest pain. She has a history of COPD, diabetes and seizures. She has had sick contacts at home. He will take. No nausea vomiting or abdominal pain. No leg pain or leg swelling. She endorses a gradual onset headache coming from her ears. Denies any dizziness, lightheadedness, focal weakness, numbness or tingling.  The history is provided by the patient.    Past Medical History  Diagnosis Date  . Hypertension   . Seizures   . COPD (chronic obstructive pulmonary disease)   . Diabetes mellitus without complication   . Cancer throat  . Sleep apnea   . Coronary artery disease   . Schizophrenia   . Migraine   . Addiction to drug   . Throat cancer    Past Surgical History  Procedure Laterality Date  . Abdominal hysterectomy    . Appendectomy    . Cholecystectomy    . Eye surgery    . Cesarean section    . Knee surgery     Family History  Problem Relation Age of Onset  . Cancer Father    History  Substance Use Topics  . Smoking status: Current Every Day Smoker -- 0.50 packs/day for 30 years    Types: Cigarettes  . Smokeless tobacco: Never Used  . Alcohol Use: No   OB History   Grav Para Term Preterm Abortions TAB SAB Ect Mult Living                 Review of Systems  Constitutional: Positive for activity change, appetite change and fatigue. Negative for fever.  HENT: Positive for congestion, ear pain, rhinorrhea and sore throat.   Respiratory: Positive for cough. Negative for chest tightness and shortness of breath.   Cardiovascular:  Negative for chest pain.  Gastrointestinal: Negative for nausea, vomiting and abdominal pain.  Genitourinary: Negative for vaginal bleeding and vaginal discharge.  Musculoskeletal: Positive for arthralgias and myalgias.  Skin: Negative for rash.  Neurological: Positive for weakness. Negative for dizziness and headaches.  A complete 10 system review of systems was obtained and all systems are negative except as noted in the HPI and PMH.      Allergies  Aspirin; Bee venom; Other; Toradol; Penicillins; and Vancomycin  Home Medications   Prior to Admission medications   Medication Sig Start Date End Date Taking? Authorizing Provider  atorvastatin (LIPITOR) 20 MG tablet Take 20 mg by mouth daily.    Historical Provider, MD  divalproex (DEPAKOTE ER) 500 MG 24 hr tablet Take 1,000 mg by mouth 2 (two) times daily.    Historical Provider, MD  Lurasidone HCl (LATUDA) 20 MG TABS Take 1 tablet by mouth daily.    Historical Provider, MD  metFORMIN (GLUCOPHAGE) 500 MG tablet Take 500 mg by mouth daily with breakfast.    Historical Provider, MD  oxyCODONE-acetaminophen (PERCOCET) 5-325 MG per tablet Take 1 tablet by mouth every 4 (four) hours as needed for moderate pain. 2/44/01   Delora Fuel, MD  promethazine (PHENERGAN) 25 MG tablet Take 25 mg by mouth every 6 (six) hours as needed for nausea  or vomiting.    Historical Provider, MD   BP 99/73  Pulse 76  Temp(Src) 98.3 F (36.8 C) (Oral)  Resp 18  Ht 5\' 2"  (1.575 m)  Wt 227 lb 2 oz (103.023 kg)  BMI 41.53 kg/m2  SpO2 97% Physical Exam  Constitutional: She appears well-developed and well-nourished. No distress.  HENT:  Head: Normocephalic and atraumatic.  Right Ear: External ear normal.  Left Ear: External ear normal.  Mouth/Throat: Oropharynx is clear and moist. No oropharyngeal exudate.  Mild oropharynx erythema. No exudate  Maxillary sinus tenderness  Eyes: Conjunctivae and EOM are normal. Pupils are equal, round, and reactive to  light.  Neck: Normal range of motion. Neck supple.  No meningismus  Cardiovascular: Normal rate, regular rhythm and normal heart sounds.   No murmur heard. Pulmonary/Chest: Effort normal and breath sounds normal. No respiratory distress.  Abdominal: Soft.  Musculoskeletal: Normal range of motion. She exhibits no edema and no tenderness.  Lymphadenopathy:    She has cervical adenopathy.  Neurological: She is alert. No cranial nerve deficit. She exhibits normal muscle tone. Coordination normal.  Skin: Skin is warm.    ED Course  Procedures (including critical care time) Labs Review Labs Reviewed  RAPID STREP SCREEN  CULTURE, GROUP A STREP    Imaging Review Dg Chest 2 View  08/21/2013   CLINICAL DATA:  Cough, short of breath and year pain  EXAM: CHEST  2 VIEW  COMPARISON:  Prior chest x-ray 04/18/2013  FINDINGS: Stable cardiac and mediastinal contours. No focal airspace consolidation, pulmonary edema, pleural effusion or pneumothorax. Stable mild central bronchitic changes and interstitial prominence. No acute osseous abnormality. Multilevel mild degenerative spurring in the mid thoracic spine. Surgical clips are present within the abdomen.  IMPRESSION: No active cardiopulmonary disease.   Electronically Signed   By: Jacqulynn Cadet M.D.   On: 08/21/2013 21:31     EKG Interpretation None      MDM   Final diagnoses:  Upper respiratory infection   2 days of ear fullness, sore throat, nonproductive cough.  No fever, chest pain, SOB. No distress, lungs clear, no wheezing. CXR neg.  Rapid strep negative.  Likely viral URI.  No PNA.  Smoking cessation discussed. No desaturation with ambulation. Return precautions discussed.  BP 99/73  Pulse 76  Temp(Src) 98.3 F (36.8 C) (Oral)  Resp 18  Ht 5\' 2"  (1.575 m)  Wt 227 lb 2 oz (103.023 kg)  BMI 41.53 kg/m2  SpO2 97%    Ezequiel Essex, MD 08/22/13 0136

## 2013-08-22 LAB — RAPID STREP SCREEN (MED CTR MEBANE ONLY): Streptococcus, Group A Screen (Direct): NEGATIVE

## 2013-08-22 MED ORDER — ANTIPYRINE-BENZOCAINE 5.4-1.4 % OT SOLN
3.0000 [drp] | Freq: Once | OTIC | Status: AC
  Administered 2013-08-22: 3 [drp] via OTIC
  Filled 2013-08-22: qty 10

## 2013-08-22 NOTE — Discharge Instructions (Signed)

## 2013-08-23 LAB — CULTURE, GROUP A STREP

## 2013-09-07 ENCOUNTER — Emergency Department (HOSPITAL_COMMUNITY)
Admission: EM | Admit: 2013-09-07 | Discharge: 2013-09-07 | Disposition: A | Discharge status: Home / Self Care | Payer: Medicaid Other | Attending: Emergency Medicine | Admitting: Emergency Medicine

## 2013-09-07 DIAGNOSIS — J4489 Other specified chronic obstructive pulmonary disease: Secondary | ICD-10-CM | POA: Insufficient documentation

## 2013-09-07 DIAGNOSIS — R51 Headache: Secondary | ICD-10-CM | POA: Insufficient documentation

## 2013-09-07 DIAGNOSIS — Z880 Allergy status to penicillin: Secondary | ICD-10-CM | POA: Insufficient documentation

## 2013-09-07 DIAGNOSIS — E119 Type 2 diabetes mellitus without complications: Secondary | ICD-10-CM | POA: Insufficient documentation

## 2013-09-07 DIAGNOSIS — Z79899 Other long term (current) drug therapy: Secondary | ICD-10-CM | POA: Insufficient documentation

## 2013-09-07 DIAGNOSIS — J449 Chronic obstructive pulmonary disease, unspecified: Secondary | ICD-10-CM | POA: Insufficient documentation

## 2013-09-07 DIAGNOSIS — I2510 Atherosclerotic heart disease of native coronary artery without angina pectoris: Secondary | ICD-10-CM | POA: Insufficient documentation

## 2013-09-07 DIAGNOSIS — F17200 Nicotine dependence, unspecified, uncomplicated: Secondary | ICD-10-CM | POA: Insufficient documentation

## 2013-09-07 DIAGNOSIS — I10 Essential (primary) hypertension: Secondary | ICD-10-CM | POA: Insufficient documentation

## 2013-09-07 DIAGNOSIS — R739 Hyperglycemia, unspecified: Secondary | ICD-10-CM

## 2013-09-07 DIAGNOSIS — G40909 Epilepsy, unspecified, not intractable, without status epilepticus: Secondary | ICD-10-CM | POA: Insufficient documentation

## 2013-09-07 DIAGNOSIS — Z8659 Personal history of other mental and behavioral disorders: Secondary | ICD-10-CM | POA: Insufficient documentation

## 2013-09-07 DIAGNOSIS — Z85819 Personal history of malignant neoplasm of unspecified site of lip, oral cavity, and pharynx: Secondary | ICD-10-CM | POA: Insufficient documentation

## 2013-09-07 LAB — CBG MONITORING, ED: GLUCOSE-CAPILLARY: 178 mg/dL — AB (ref 70–99)

## 2013-09-07 MED ORDER — ACETAMINOPHEN 325 MG PO TABS
650.0000 mg | ORAL_TABLET | Freq: Once | ORAL | Status: AC
  Administered 2013-09-07: 650 mg via ORAL
  Filled 2013-09-07: qty 2

## 2013-09-07 NOTE — ED Notes (Signed)
Bed: ZD63 Expected date:  Expected time:  Means of arrival:  Comments: EMS/elevated blood sugar

## 2013-09-07 NOTE — ED Notes (Signed)
Pt states that she took her BS 30 mins ago and it was 235 and she took a metformin. Upon EMS arrival it was 201. Pt states that she is anxious, shaky, and dizzy.

## 2013-09-07 NOTE — ED Provider Notes (Signed)
CSN: [id number]     Arrival date & time 09/07/13  0122 History   First MD Initiated Contact with Patient 09/07/13 0220     Chief Complaint  Patient presents with  . Hyperglycemia     HPI Patient presents to the emergency department for elevated blood sugar of 235.  She states she is prescribed metformin.  She reports that her doctors tell her to take as needed for elevated blood sugars.  She says that recently her blood sugars have been in the 170s to 230s.  She occasionally also has lows down in the 50s and 70s.  Reports mild headache at this time.  She denies chest pain shortness breath.  No cough or congestion.  Denies abdominal pain.  No nausea vomiting or diarrhea.  No urinary complaints.  Symptoms are mild in severity.   Past Medical History  Diagnosis Date  . Hypertension   . Seizures   . COPD (chronic obstructive pulmonary disease)   . Diabetes mellitus without complication   . Cancer throat  . Sleep apnea   . Coronary artery disease   . Schizophrenia   . Migraine   . Addiction to drug   . Throat cancer    Past Surgical History  Procedure Laterality Date  . Abdominal hysterectomy    . Appendectomy    . Cholecystectomy    . Eye surgery    . Cesarean section    . Knee surgery     Family History  Problem Relation Age of Onset  . Cancer Father    History  Substance Use Topics  . Smoking status: Current Every Day Smoker -- 0.50 packs/day for 30 years    Types: Cigarettes  . Smokeless tobacco: Never Used  . Alcohol Use: No   OB History   Grav Para Term Preterm Abortions TAB SAB Ect Mult Living                 Review of Systems  All other systems reviewed and are negative.     Allergies  Aspirin; Bee venom; Other; Toradol; Penicillins; and Vancomycin  Home Medications   Prior to Admission medications   Medication Sig Start Date End Date Taking? Authorizing Raela Bohl  divalproex (DEPAKOTE ER) 500 MG 24 hr tablet Take 1,000 mg by mouth 2 (two) times  daily.   Yes Historical Nyanna Heideman, MD  Lurasidone HCl (LATUDA) 20 MG TABS Take 1 tablet by mouth daily.   Yes Historical Fayette Hamada, MD  metFORMIN (GLUCOPHAGE) 500 MG tablet Take 500 mg by mouth daily with breakfast.   Yes Historical Magan Winnett, MD  nitroGLYCERIN (NITROSTAT) 0.4 MG SL tablet Place 0.4 mg under the tongue every 5 (five) minutes as needed for chest pain.   Yes Historical Towana Stenglein, MD   BP 152/63  Pulse 72  Temp(Src) 99.5 F (37.5 C) (Oral)  Resp 22  SpO2 100% Physical Exam  Nursing note and vitals reviewed. Constitutional: She is oriented to person, place, and time. She appears well-developed and well-nourished. No distress.  HENT:  Head: Normocephalic and atraumatic.  Eyes: EOM are normal. Pupils are equal, round, and reactive to light.  Neck: Normal range of motion.  Cardiovascular: Normal rate, regular rhythm and normal heart sounds.   Pulmonary/Chest: Effort normal and breath sounds normal.  Abdominal: Soft. She exhibits no distension. There is no tenderness.  Musculoskeletal: Normal range of motion.  Neurological: She is alert and oriented to person, place, and time.  5/5 strength in major muscle groups of  bilateral  upper and lower extremities. Speech normal. No facial asymetry.   Skin: Skin is warm and dry.  Psychiatric: She has a normal mood and affect. Judgment normal.    ED Course  Procedures (including critical care time) Labs Review Labs Reviewed  CBG MONITORING, ED - Abnormal; Notable for the following:    Glucose-Capillary 178 (*)    All other components within normal limits    Imaging Review No results found.   EKG Interpretation None      MDM   Final diagnoses:  None    Mild elevation in blood sugar 178.  I've informed her that metformin is on a medication that is post be taken as needed.  She does with this is her doctors tell her to take it.  I have asked that she followup with her primary care physicians to clarify her medications and  the instructions in use.  She May have low-grade viral illness given her oral temperature 99.5.  This could be raising her blood sugars.  Discharged in good condition.  Normal neurologic exam.  No nuchal rigidity.  Doubt meningitis.  No urinary complaints.    Hoy Morn, MD 09/07/13 [phone]

## 2013-09-07 NOTE — Discharge Instructions (Signed)

## 2013-09-28 ENCOUNTER — Encounter (HOSPITAL_COMMUNITY): Payer: Self-pay | Admitting: Emergency Medicine

## 2013-09-28 ENCOUNTER — Other Ambulatory Visit (HOSPITAL_COMMUNITY): Payer: Medicaid Other

## 2013-09-28 ENCOUNTER — Emergency Department (HOSPITAL_COMMUNITY)
Admission: EM | Admit: 2013-09-28 | Discharge: 2013-09-28 | Disposition: A | Discharge status: Home / Self Care | Payer: Medicaid Other | Attending: Emergency Medicine | Admitting: Emergency Medicine

## 2013-09-28 ENCOUNTER — Emergency Department (HOSPITAL_COMMUNITY): Payer: Medicaid Other

## 2013-09-28 DIAGNOSIS — Z85850 Personal history of malignant neoplasm of thyroid: Secondary | ICD-10-CM | POA: Insufficient documentation

## 2013-09-28 DIAGNOSIS — Z792 Long term (current) use of antibiotics: Secondary | ICD-10-CM | POA: Insufficient documentation

## 2013-09-28 DIAGNOSIS — Z8589 Personal history of malignant neoplasm of other organs and systems: Secondary | ICD-10-CM | POA: Diagnosis not present

## 2013-09-28 DIAGNOSIS — R109 Unspecified abdominal pain: Secondary | ICD-10-CM | POA: Diagnosis present

## 2013-09-28 DIAGNOSIS — Z79899 Other long term (current) drug therapy: Secondary | ICD-10-CM | POA: Diagnosis not present

## 2013-09-28 DIAGNOSIS — I10 Essential (primary) hypertension: Secondary | ICD-10-CM | POA: Insufficient documentation

## 2013-09-28 DIAGNOSIS — Z880 Allergy status to penicillin: Secondary | ICD-10-CM | POA: Insufficient documentation

## 2013-09-28 DIAGNOSIS — Z8659 Personal history of other mental and behavioral disorders: Secondary | ICD-10-CM | POA: Insufficient documentation

## 2013-09-28 DIAGNOSIS — G40909 Epilepsy, unspecified, not intractable, without status epilepticus: Secondary | ICD-10-CM | POA: Insufficient documentation

## 2013-09-28 DIAGNOSIS — Z9089 Acquired absence of other organs: Secondary | ICD-10-CM | POA: Diagnosis not present

## 2013-09-28 DIAGNOSIS — E119 Type 2 diabetes mellitus without complications: Secondary | ICD-10-CM | POA: Diagnosis not present

## 2013-09-28 DIAGNOSIS — J449 Chronic obstructive pulmonary disease, unspecified: Secondary | ICD-10-CM | POA: Insufficient documentation

## 2013-09-28 DIAGNOSIS — N3000 Acute cystitis without hematuria: Secondary | ICD-10-CM | POA: Insufficient documentation

## 2013-09-28 DIAGNOSIS — F17200 Nicotine dependence, unspecified, uncomplicated: Secondary | ICD-10-CM | POA: Insufficient documentation

## 2013-09-28 DIAGNOSIS — Z90710 Acquired absence of both cervix and uterus: Secondary | ICD-10-CM | POA: Diagnosis not present

## 2013-09-28 DIAGNOSIS — I2510 Atherosclerotic heart disease of native coronary artery without angina pectoris: Secondary | ICD-10-CM | POA: Insufficient documentation

## 2013-09-28 DIAGNOSIS — J4489 Other specified chronic obstructive pulmonary disease: Secondary | ICD-10-CM | POA: Insufficient documentation

## 2013-09-28 LAB — BASIC METABOLIC PANEL
Anion gap: 13 (ref 5–15)
BUN: 11 mg/dL (ref 6–23)
CALCIUM: 9.3 mg/dL (ref 8.4–10.5)
CO2: 23 mEq/L (ref 19–32)
Chloride: 101 mEq/L (ref 96–112)
Creatinine, Ser: 0.6 mg/dL (ref 0.50–1.10)
GLUCOSE: 187 mg/dL — AB (ref 70–99)
Potassium: 4.1 mEq/L (ref 3.7–5.3)
Sodium: 137 mEq/L (ref 137–147)

## 2013-09-28 LAB — CBC WITH DIFFERENTIAL/PLATELET
BASOS PCT: 0 % (ref 0–1)
Basophils Absolute: 0 10*3/uL (ref 0.0–0.1)
EOS ABS: 0.1 10*3/uL (ref 0.0–0.7)
Eosinophils Relative: 1 % (ref 0–5)
HCT: 37.6 % (ref 36.0–46.0)
Hemoglobin: 12.9 g/dL (ref 12.0–15.0)
Lymphocytes Relative: 25 % (ref 12–46)
Lymphs Abs: 3.1 10*3/uL (ref 0.7–4.0)
MCH: 31.6 pg (ref 26.0–34.0)
MCHC: 34.3 g/dL (ref 30.0–36.0)
MCV: 92.2 fL (ref 78.0–100.0)
MONO ABS: 0.8 10*3/uL (ref 0.1–1.0)
Monocytes Relative: 7 % (ref 3–12)
NEUTROS ABS: 8.3 10*3/uL — AB (ref 1.7–7.7)
Neutrophils Relative %: 67 % (ref 43–77)
Platelets: 200 10*3/uL (ref 150–400)
RBC: 4.08 MIL/uL (ref 3.87–5.11)
RDW: 12.7 % (ref 11.5–15.5)
WBC: 12.3 10*3/uL — ABNORMAL HIGH (ref 4.0–10.5)

## 2013-09-28 LAB — URINALYSIS, ROUTINE W REFLEX MICROSCOPIC
BILIRUBIN URINE: NEGATIVE
Glucose, UA: 100 mg/dL — AB
Ketones, ur: NEGATIVE mg/dL
NITRITE: NEGATIVE
PROTEIN: 100 mg/dL — AB
Specific Gravity, Urine: 1.023 (ref 1.005–1.030)
UROBILINOGEN UA: 0.2 mg/dL (ref 0.0–1.0)
pH: 5.5 (ref 5.0–8.0)

## 2013-09-28 LAB — URINE MICROSCOPIC-ADD ON

## 2013-09-28 MED ORDER — MORPHINE SULFATE 4 MG/ML IJ SOLN
6.0000 mg | Freq: Once | INTRAMUSCULAR | Status: AC
  Administered 2013-09-28: 6 mg via INTRAVENOUS
  Filled 2013-09-28: qty 2

## 2013-09-28 MED ORDER — CEPHALEXIN 500 MG PO CAPS: 500.0000 mg | ORAL_CAPSULE | Freq: Three times a day (TID) | ORAL | Status: DC

## 2013-09-28 MED ORDER — PHENAZOPYRIDINE HCL 200 MG PO TABS: 200.0000 mg | ORAL_TABLET | Freq: Three times a day (TID) | ORAL | Status: DC

## 2013-09-28 MED ORDER — ONDANSETRON HCL 4 MG/2ML IJ SOLN
4.0000 mg | Freq: Once | INTRAMUSCULAR | Status: AC
  Administered 2013-09-28: 4 mg via INTRAVENOUS
  Filled 2013-09-28: qty 2

## 2013-09-28 MED ORDER — SODIUM CHLORIDE 0.9 % IV BOLUS (SEPSIS)
1000.0000 mL | Freq: Once | INTRAVENOUS | Status: AC
  Administered 2013-09-28: 1000 mL via INTRAVENOUS

## 2013-09-28 MED ORDER — CEPHALEXIN 500 MG PO CAPS
1000.0000 mg | ORAL_CAPSULE | Freq: Once | ORAL | Status: AC
  Administered 2013-09-28: 1000 mg via ORAL
  Filled 2013-09-28: qty 2

## 2013-09-28 NOTE — ED Notes (Signed)
Per EMS pt c/o left abdominal pain radiating to left groin and flank, dysuria. EMS administered 100 mcg Fentanyl reducing pain from 10/10 to 4/10.

## 2013-09-28 NOTE — Discharge Instructions (Signed)

## 2013-09-28 NOTE — ED Notes (Signed)
Bed: LK95 Expected date:  Expected time:  Means of arrival:  Comments: EMS- flank pain, kidney stones?

## 2013-09-28 NOTE — ED Notes (Signed)
Patient was educated not to drive, operate heavy machinery, or drink alcohol while taking narcotic medication.  

## 2013-09-28 NOTE — Progress Notes (Signed)
  CARE MANAGEMENT ED NOTE 09/28/2013  Patient:  Carol Hughes, Carol Hughes   Account Number:  [id number]  Date Initiated:  09/28/2013  Documentation initiated by:  Jackelyn Poling  Subjective/Objective Assessment:   48 yr old [place] access c/o left abdominal pain radiating to left groin and flank     Subjective/Objective Assessment Detail:     Action/Plan:   epic updated after spoke with pt who confirms pcp as [facility]   Action/Plan Detail:   Anticipated DC Date:       Status Recommendation to Physician:   Result of Recommendation:    Other ED Services  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, radio  Other  Outpatient Services - Pt will follow up  PCP issues    Choice offered to / List presented to:            Status of service:  Completed, signed off  ED Comments:   ED Comments Detail:

## 2013-09-29 NOTE — ED Provider Notes (Signed)
CSN: [id number]     Arrival date & time 09/28/13  1352 History   First MD Initiated Contact with Patient 09/28/13 1409     Chief Complaint  Patient presents with  . Abdominal Pain  . Flank Pain  . Dysuria     (Consider location/radiation/quality/duration/timing/severity/associated sxs/prior Treatment) HPI Patient reports dysuria for the past 2 days now with some pain radiating towards her left groin and left flank.  She denies fevers and chills.  No nausea and vomiting.  Pain was treated by EMS prior to my evaluation.  The patient is feeling somewhat better at this time.  She has no prior history of kidney stones.  She denies vaginal complaints.  No upper abdominal pain.  No chest pain or shortness of breath.  Pain is moderate in severity at this time.  Nothing worsens or improves her pain.   Past Medical History  Diagnosis Date  . Hypertension   . Seizures   . COPD (chronic obstructive pulmonary disease)   . Diabetes mellitus without complication   . Sleep apnea   . Coronary artery disease   . Schizophrenia   . Migraine   . Addiction to drug   . Cancer throat  . Throat cancer    Past Surgical History  Procedure Laterality Date  . Abdominal hysterectomy    . Appendectomy    . Cholecystectomy    . Eye surgery    . Cesarean section    . Knee surgery     Family History  Problem Relation Age of Onset  . Cancer Father    History  Substance Use Topics  . Smoking status: Current Every Day Smoker -- 0.50 packs/day for 30 years    Types: Cigarettes  . Smokeless tobacco: Never Used  . Alcohol Use: No   OB History   Grav Para Term Preterm Abortions TAB SAB Ect Mult Living                 Review of Systems  All other systems reviewed and are negative.     Allergies  Aspirin; Bee venom; Other; Toradol; Penicillins; and Vancomycin  Home Medications   Prior to Admission medications   Medication Sig Start Date End Date Taking? Authorizing Provider  cephALEXin  (KEFLEX) 500 MG capsule Take 1 capsule (500 mg total) by mouth 3 (three) times daily. 09/28/13   Hoy Morn, MD  divalproex (DEPAKOTE ER) 500 MG 24 hr tablet Take 1,000 mg by mouth 2 (two) times daily.    Historical Provider, MD  Lurasidone HCl (LATUDA) 20 MG TABS Take 1 tablet by mouth daily.    Historical Provider, MD  metFORMIN (GLUCOPHAGE) 500 MG tablet Take 500 mg by mouth daily with breakfast.    Historical Provider, MD  nitroGLYCERIN (NITROSTAT) 0.4 MG SL tablet Place 0.4 mg under the tongue every 5 (five) minutes as needed for chest pain.    Historical Provider, MD  phenazopyridine (PYRIDIUM) 200 MG tablet Take 1 tablet (200 mg total) by mouth 3 (three) times daily. 09/28/13   Hoy Morn, MD   BP 128/61  Pulse 78  Temp(Src) 98.1 F (36.7 C) (Oral)  Resp 20  SpO2 100% Physical Exam  Nursing note and vitals reviewed. Constitutional: She is oriented to person, place, and time. She appears well-developed and well-nourished. No distress.  HENT:  Head: Normocephalic and atraumatic.  Eyes: EOM are normal.  Neck: Normal range of motion.  Cardiovascular: Normal rate, regular rhythm and normal heart sounds.  Pulmonary/Chest: Effort normal and breath sounds normal.  Abdominal: Soft. She exhibits no distension. There is no tenderness.  Musculoskeletal: Normal range of motion.  Neurological: She is alert and oriented to person, place, and time.  Skin: Skin is warm and dry.  Psychiatric: She has a normal mood and affect. Judgment normal.    ED Course  Procedures (including critical care time) Labs Review Labs Reviewed  CBC WITH DIFFERENTIAL - Abnormal; Notable for the following:    WBC 12.3 (*)    Neutro Abs 8.3 (*)    All other components within normal limits  URINALYSIS, ROUTINE W REFLEX MICROSCOPIC - Abnormal; Notable for the following:    APPearance CLOUDY (*)    Glucose, UA 100 (*)    Hgb urine dipstick LARGE (*)    Protein, ur 100 (*)    Leukocytes, UA MODERATE (*)     All other components within normal limits  BASIC METABOLIC PANEL - Abnormal; Notable for the following:    Glucose, Bld 187 (*)    All other components within normal limits  URINE MICROSCOPIC-ADD ON - Abnormal; Notable for the following:    Bacteria, UA MANY (*)    All other components within normal limits  URINE CULTURE    Imaging Review Ct Abdomen Pelvis Wo Contrast  09/28/2013   CLINICAL DATA:  Umbilical pain  EXAM: CT ABDOMEN AND PELVIS WITHOUT CONTRAST  TECHNIQUE: Multidetector CT imaging of the abdomen and pelvis was performed following the standard protocol without IV contrast.  COMPARISON:  None.  FINDINGS: Lung bases are free of acute infiltrate or sizable effusion.  The gallbladder has been surgically removed. The liver, spleen, adrenal glands and pancreas are normal in their CT appearance without contrast material. The kidneys are well visualized bilaterally without renal calculi or urinary tract obstructive change. The ureters are within normal limits. Aortic calcifications are noted without aneurysmal dilatation. The appendix is not well visualized consistent with the patient's given clinical history. The uterus is been surgically removed. The bladder is partially distended. No pelvic sidewall mass lesion is seen no significant lymphadenopathy is noted. Degenerative change of the lumbar spine is seen. Small amount of fat is noted within the umbilicus. No definitive hernia is seen.  IMPRESSION: Nonspecific abdomen and pelvis. Postoperative changes are seen. No acute abnormality is noted.   Electronically Signed   By: Inez Catalina M.D.   On: 09/28/2013 14:51     EKG Interpretation None      MDM   Final diagnoses:  Acute cystitis without hematuria    Patient with acute cystitis.  CT scan demonstrates an obstructing stone.  Discharge home in good condition.  Patient feels better.  Home with antibiotics.  She understands to return to the ER for new or worsening symptoms    Hoy Morn, MD 09/29/13 1110

## 2013-10-01 LAB — URINE CULTURE: Colony Count: >=100000

## 2013-10-02 ENCOUNTER — Telehealth (HOSPITAL_COMMUNITY): Payer: Self-pay

## 2013-10-02 NOTE — ED Notes (Signed)
Post ED Visit - Positive Culture Follow-up  Culture report reviewed by antimicrobial stewardship pharmacist: [x]  Wes Hamlin, Pharm.D., BCPS []  Heide Guile, Pharm.D., BCPS []  Alycia Rossetti, Pharm.D., BCPS []  [place], Pharm.D., BCPS, AAHIVP []  Legrand Como, Pharm.D., BCPS, AAHIVP []    Positive urine culture Treated with cephalexin, organism sensitive to the same and no further patient follow-up is required at this time.  Ileene Musa 10/02/2013, 10:59 AM

## 2013-10-11 ENCOUNTER — Emergency Department (HOSPITAL_COMMUNITY): Payer: Medicaid Other

## 2013-10-11 ENCOUNTER — Encounter (HOSPITAL_COMMUNITY): Payer: Self-pay | Admitting: Emergency Medicine

## 2013-10-11 ENCOUNTER — Emergency Department (HOSPITAL_COMMUNITY)
Admission: EM | Admit: 2013-10-11 | Discharge: 2013-10-11 | Disposition: A | Discharge status: Home / Self Care | Payer: Medicaid Other | Attending: Emergency Medicine | Admitting: Emergency Medicine

## 2013-10-11 DIAGNOSIS — E119 Type 2 diabetes mellitus without complications: Secondary | ICD-10-CM | POA: Insufficient documentation

## 2013-10-11 DIAGNOSIS — R21 Rash and other nonspecific skin eruption: Secondary | ICD-10-CM | POA: Insufficient documentation

## 2013-10-11 DIAGNOSIS — G43909 Migraine, unspecified, not intractable, without status migrainosus: Secondary | ICD-10-CM | POA: Insufficient documentation

## 2013-10-11 DIAGNOSIS — J4489 Other specified chronic obstructive pulmonary disease: Secondary | ICD-10-CM | POA: Insufficient documentation

## 2013-10-11 DIAGNOSIS — J449 Chronic obstructive pulmonary disease, unspecified: Secondary | ICD-10-CM | POA: Diagnosis not present

## 2013-10-11 DIAGNOSIS — R079 Chest pain, unspecified: Secondary | ICD-10-CM | POA: Insufficient documentation

## 2013-10-11 DIAGNOSIS — Z859 Personal history of malignant neoplasm, unspecified: Secondary | ICD-10-CM | POA: Insufficient documentation

## 2013-10-11 DIAGNOSIS — I2510 Atherosclerotic heart disease of native coronary artery without angina pectoris: Secondary | ICD-10-CM | POA: Insufficient documentation

## 2013-10-11 DIAGNOSIS — F17200 Nicotine dependence, unspecified, uncomplicated: Secondary | ICD-10-CM | POA: Insufficient documentation

## 2013-10-11 DIAGNOSIS — Z79899 Other long term (current) drug therapy: Secondary | ICD-10-CM | POA: Diagnosis not present

## 2013-10-11 DIAGNOSIS — I10 Essential (primary) hypertension: Secondary | ICD-10-CM | POA: Insufficient documentation

## 2013-10-11 LAB — I-STAT TROPONIN, ED: TROPONIN I, POC: 0 ng/mL (ref 0.00–0.08)

## 2013-10-11 LAB — BASIC METABOLIC PANEL
Anion gap: 12 (ref 5–15)
BUN: 9 mg/dL (ref 6–23)
CO2: 26 meq/L (ref 19–32)
Calcium: 9.1 mg/dL (ref 8.4–10.5)
Chloride: 103 mEq/L (ref 96–112)
Creatinine, Ser: 0.61 mg/dL (ref 0.50–1.10)
GFR calc Af Amer: >90 mL/min (ref >90)
Glucose, Bld: 133 mg/dL — ABNORMAL HIGH (ref 70–99)
POTASSIUM: 4.3 meq/L (ref 3.7–5.3)
SODIUM: 141 meq/L (ref 137–147)

## 2013-10-11 LAB — CBC
HCT: 39.2 % (ref 36.0–46.0)
Hemoglobin: 13.1 g/dL (ref 12.0–15.0)
MCH: 31.3 pg (ref 26.0–34.0)
MCHC: 33.4 g/dL (ref 30.0–36.0)
MCV: 93.6 fL (ref 78.0–100.0)
Platelets: 174 10*3/uL (ref 150–400)
RBC: 4.19 MIL/uL (ref 3.87–5.11)
RDW: 12.7 % (ref 11.5–15.5)
WBC: 7 10*3/uL (ref 4.0–10.5)

## 2013-10-11 MED ORDER — NITROGLYCERIN 0.4 MG SL SUBL
0.4000 mg | SUBLINGUAL_TABLET | SUBLINGUAL | Status: DC | PRN
  Administered 2013-10-11 (×2): 0.4 mg via SUBLINGUAL
  Filled 2013-10-11: qty 1

## 2013-10-11 NOTE — ED Notes (Signed)
Pt reports taking Cipro x 4 days, states, "I wonder if it could be why I have the rash."

## 2013-10-11 NOTE — ED Provider Notes (Signed)
TIME SEEN: 10:40 AM  CHIEF COMPLAINT: Chest pain, rash  HPI: Patient is a 48 y.o. F with history of hypertension, diabetes, COPD, coronary artery disease, schizophrenia, migraines who presents emergency department with a main complaint of diffuse pruritic rash that she noticed after starting Keflex 4 days ago. She denies any tick bite, headache, fever, vomiting or diarrhea, neck pain or neck stiffness, difficulty breathing or wheezing, swelling of her lips or tongue. No other new exposures.  She also mentions that she has had sharp diffuse chest pain without radiation and no associated shortness of breath, nausea or vomiting, diaphoresis or dizziness that has been present intermittently for 6 years. She states her pain started last night and has been constant. She states that she has a reported history of a "mild heart attack" but she was not in [place] at that time. She states that offered her cardiac catheterization which she refused. She denies a history of stress test or catheterization. She does not yet have a cardiologist in [place]. She states that this pain feels exactly like her symptoms of chest pain that she has had intermittently for 6 years that improves with nitroglycerin. There is no change today. Pain came on at rest which is typical for her. No history of PE or DVT. No recent prolonged immobilization, fracture, surgery, trauma, exogenous hormone use. She states that this is not the primary reason she came to the emergency department but her rash was her biggest concern.  ROS: See HPI Constitutional: no fever  Eyes: no drainage  ENT: no runny nose   Cardiovascular:   chest pain  Resp: no SOB  GI: no vomiting GU: no dysuria Integumentary:  rash  Allergy: no hives  Musculoskeletal: no leg swelling  Neurological: no slurred speech ROS otherwise negative  PAST MEDICAL HISTORY/PAST SURGICAL HISTORY:  Past Medical History  Diagnosis Date  . Hypertension   . Seizures    . COPD (chronic obstructive pulmonary disease)   . Diabetes mellitus without complication   . Sleep apnea   . Coronary artery disease   . Schizophrenia   . Migraine   . Addiction to drug   . Cancer throat  . Throat cancer     MEDICATIONS:  Prior to Admission medications   Medication Sig Start Date End Date Taking? Authorizing Provider  cephALEXin (KEFLEX) 500 MG capsule Take 1 capsule (500 mg total) by mouth 3 (three) times daily. 09/28/13  Yes Hoy Morn, MD  divalproex (DEPAKOTE ER) 500 MG 24 hr tablet Take 1,000 mg by mouth 2 (two) times daily.   Yes Historical Provider, MD  Lurasidone HCl (LATUDA) 20 MG TABS Take 20 mg by mouth daily.    Yes Historical Provider, MD  metFORMIN (GLUCOPHAGE) 500 MG tablet Take 500 mg by mouth daily with breakfast.   Yes Historical Provider, MD  nitroGLYCERIN (NITROSTAT) 0.4 MG SL tablet Place 0.4 mg under the tongue every 5 (five) minutes as needed for chest pain.   Yes Historical Provider, MD  phenazopyridine (PYRIDIUM) 200 MG tablet Take 1 tablet (200 mg total) by mouth 3 (three) times daily. 09/28/13  Yes Hoy Morn, MD    ALLERGIES:  Allergies  Allergen Reactions  . Aspirin Other (See Comments)    Adult strength causes "burning", baby is ok.   . Bee Venom Anaphylaxis  . Other Other (See Comments)    Tomatoes, onions makes her "can't breathe"  . Toradol [Ketorolac Tromethamine] Itching and Nausea And Vomiting  . Penicillins  Other (See Comments)    Childhood reaction, unknown  . Vancomycin Itching    SOCIAL HISTORY:  History  Substance Use Topics  . Smoking status: Current Every Day Smoker -- 0.50 packs/day for 30 years    Types: Cigarettes  . Smokeless tobacco: Never Used  . Alcohol Use: No    FAMILY HISTORY: Family History  Problem Relation Age of Onset  . Cancer Father     EXAM: BP 143/63  Pulse 65  Temp(Src) 98.4 F (36.9 C) (Oral)  Resp 30  SpO2 98% CONSTITUTIONAL: Alert and oriented and responds appropriately  to questions. Well-appearing; well-nourished HEAD: Normocephalic EYES: Conjunctivae clear, PERRL ENT: normal nose; no rhinorrhea; moist mucous membranes; pharynx without lesions noted NECK: Supple, no meningismus, no LAD  CARD: RRR; S1 and S2 appreciated; no murmurs, no clicks, no rubs, no gallops RESP: Normal chest excursion without splinting or tachypnea; breath sounds clear and equal bilaterally; no wheezes, no rhonchi, no rales, no respiratory distress or hypoxia, no increased work of breathing ABD/GI: Normal bowel sounds; non-distended; soft, non-tender, no rebound, no guarding BACK:  The back appears normal and is non-tender to palpation, there is no CVA tenderness EXT: Normal ROM in all joints; non-tender to palpation; no edema; normal capillary refill; no cyanosis    SKIN: Normal color for age and race; warm, patient has an erythematous papular rash to her trunk and extremities, no rash on her palms or soles, no lesions of her mucous membranes, no blisters, no hives, no induration or warmth or erythema or drainage NEURO: Moves all extremities equally PSYCH: The patient's mood and manner are appropriate. Grooming and personal hygiene are appropriate.  MEDICAL DECISION MAKING: Patient here with complaints of rash that she feels started after taking Keflex. There are no hives or other sign of allergic reaction. Rash is very nonspecific but does not appear to be life-threatening. Have discussed with her I recommend stopping Keflex and using Benadryl as needed for itching. Have discussed with her she should followup with her primary care physician if this does not improve.  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 She states it does come on at rest but that this is been present for 6 years. She states that her pain is a sharp pain with no other associated symptoms and started last night. Her EKG here shows no ischemic changes or  arrhythmia. Her chest x-ray is clear. Cardiac labs including troponin negative. I do not feel she needs serial sets of cardiac enzymes given she has had constant chest pain since last night. Her pain is completely resolved after nitroglycerin and again is described as exactly like her prior episodes of stable angina. I do not feel that she needs prior workup today but we'll give her outpatient cardiology followup information so that she can establish a cardiologist here in [place]. Low suspicion for pulmonary embolus as she is PERC negative.  Low suspicion for dissection, and she has no back pain, equal pulses in all of her extremities and appears very comfortable on exam. Patient verbalizes understanding and is comfortable with this plan.       EKG Interpretation  Date/Time:  Wednesday October 11 2013 10:26:00 EDT Ventricular Rate:  67 PR Interval:  163 QRS Duration: 93 QT Interval:  396 QTC Calculation: 418 R Axis:   72 Text Interpretation:  Sinus rhythm Low voltage with right axis deviation Baseline wander in lead(s) II III aVF  V2 V5 V6 Confirmed by Lydian Chavous,  DO, Sherrita Riederer [phone]) on 10/11/2013 10:39:01 AM         [place], DO 10/11/13 1147

## 2013-10-11 NOTE — ED Notes (Signed)
She states she is here for an itchy rash all over her body this week. She states it gets worse at night when she sleeps. shes also having cp and she is concerned because she has a history of copd.

## 2013-10-11 NOTE — ED Notes (Signed)
Pt refusing third nitro.

## 2013-10-11 NOTE — ED Notes (Signed)
Pt transported to Xray. 

## 2013-10-11 NOTE — Discharge Instructions (Signed)
Chest Pain (Nonspecific) °It is often hard to give a specific diagnosis for the cause of chest pain. There is always a chance that your pain could be related to something serious, such as a heart attack or a blood clot in the lungs. You need to follow up with your health care provider for further evaluation. °CAUSES  °· Heartburn. °· Pneumonia or bronchitis. °· Anxiety or stress. °· Inflammation around your heart (pericarditis) or lung (pleuritis or pleurisy). °· A blood clot in the lung. °· A collapsed lung (pneumothorax). It can develop suddenly on its own (spontaneous pneumothorax) or from trauma to the chest. °· Shingles infection (herpes zoster virus). °The chest wall is composed of bones, muscles, and cartilage. Any of these can be the source of the pain. °· The bones can be bruised by injury. °· The muscles or cartilage can be strained by coughing or overwork. °· The cartilage can be affected by inflammation and become sore (costochondritis). °DIAGNOSIS  °Lab tests or other studies may be needed to find the cause of your pain. Your health care provider may have you take a test called an ambulatory electrocardiogram (ECG). An ECG records your heartbeat patterns over a 24-hour period. You may also have other tests, such as: °· Transthoracic echocardiogram (TTE). During echocardiography, sound waves are used to evaluate how blood flows through your heart. °· Transesophageal echocardiogram (TEE). °· Cardiac monitoring. This allows your health care provider to monitor your heart rate and rhythm in real time. °· Holter monitor. This is a portable device that records your heartbeat and can help diagnose heart arrhythmias. It allows your health care provider to track your heart activity for several days, if needed. °· Stress tests by exercise or by giving medicine that makes the heart beat faster. °TREATMENT  °· Treatment depends on what may be causing your chest pain. Treatment may include: °¨ Acid blockers for  heartburn. °¨ Anti-inflammatory medicine. °¨ Pain medicine for inflammatory conditions. °¨ Antibiotics if an infection is present. °· You may be advised to change lifestyle habits. This includes stopping smoking and avoiding alcohol, caffeine, and chocolate. °· You may be advised to keep your head raised (elevated) when sleeping. This reduces the chance of acid going backward from your stomach into your esophagus. °Most of the time, nonspecific chest pain will improve within 2-3 days with rest and mild pain medicine.  °HOME CARE INSTRUCTIONS  °· If antibiotics were prescribed, take them as directed. Finish them even if you start to feel better. °· For the next few days, avoid physical activities that bring on chest pain. Continue physical activities as directed. °· Do not use any tobacco products, including cigarettes, chewing tobacco, or electronic cigarettes. °· Avoid drinking alcohol. °· Only take medicine as directed by your health care provider. °· Follow your health care provider's suggestions for further testing if your chest pain does not go away. °· Keep any follow-up appointments you made. If you do not go to an appointment, you could develop lasting (chronic) problems with pain. If there is any problem keeping an appointment, call to reschedule. °SEEK MEDICAL CARE IF:  °· Your chest pain does not go away, even after treatment. °· You have a rash with blisters on your chest. °· You have a fever. °SEEK IMMEDIATE MEDICAL CARE IF:  °· You have increased chest pain or pain that spreads to your arm, neck, jaw, back, or abdomen. °· You have shortness of breath. °· You have an increasing cough, or you cough   up blood.  You have severe back or abdominal pain.  You feel nauseous or vomit.  You have severe weakness.  You faint.  You have chills. This is an emergency. Do not wait to see if the pain will go away. Get medical help at once. Call your local emergency services (911 in [place] Do not drive  yourself to the hospital. MAKE SURE YOU:   Understand these instructions.  Will watch your condition.  Will get help right away if you are not doing well or get worse. Document Released: 12/17/2004 Document Revised: 03/14/2013 Document Reviewed: 10/13/2007 [facility] Patient Information 2015 [place], [place]. This information is not intended to replace advice given to you by your health care provider. Make sure you discuss any questions you have with your health care provider.  Rash A rash is a change in the color or texture of your skin. There are many different types of rashes. You may have other problems that accompany your rash. CAUSES   Infections.  Allergic reactions. This can include allergies to pets or foods.  Certain medicines.  Exposure to certain chemicals, soaps, or cosmetics.  Heat.  Exposure to poisonous plants.  Tumors, both cancerous and noncancerous. SYMPTOMS   Redness.  Scaly skin.  Itchy skin.  Dry or cracked skin.  Bumps.  Blisters.  Pain. DIAGNOSIS  Your caregiver may do a physical exam to determine what type of rash you have. A skin sample (biopsy) may be taken and examined under a microscope. TREATMENT  Treatment depends on the type of rash you have. Your caregiver may prescribe certain medicines. For serious conditions, you may need to see a skin doctor (dermatologist). HOME CARE INSTRUCTIONS   Avoid the substance that caused your rash.  Do not scratch your rash. This can cause infection.  You may take cool baths to help stop itching.  Only take over-the-counter or prescription medicines as directed by your caregiver.  Keep all follow-up appointments as directed by your caregiver. SEEK IMMEDIATE MEDICAL CARE IF:  You have increasing pain, swelling, or redness.  You have a fever.  You have new or severe symptoms.  You have body aches, diarrhea, or vomiting.  Your rash is not better after 3 days. MAKE SURE YOU:  Understand  these instructions.  Will watch your condition.  Will get help right away if you are not doing well or get worse. Document Released: 02/27/2002 Document Revised: 06/01/2011 Document Reviewed: 12/22/2010 [facility] Patient Information 2015 [place], [place]. This information is not intended to replace advice given to you by your health care provider. Make sure you discuss any questions you have with your health care provider.

## 2013-10-11 NOTE — ED Notes (Signed)
Per Dr. Leonides Schanz, if first nitro has no change on CP, to not give the rest.

## 2013-10-12 ENCOUNTER — Emergency Department (HOSPITAL_COMMUNITY)
Admission: EM | Admit: 2013-10-12 | Discharge: 2013-10-12 | Disposition: A | Discharge status: Home / Self Care | Payer: Medicaid Other | Attending: Emergency Medicine | Admitting: Emergency Medicine

## 2013-10-12 ENCOUNTER — Encounter (HOSPITAL_COMMUNITY): Payer: Self-pay | Admitting: Emergency Medicine

## 2013-10-12 DIAGNOSIS — Z79899 Other long term (current) drug therapy: Secondary | ICD-10-CM | POA: Insufficient documentation

## 2013-10-12 DIAGNOSIS — Z3202 Encounter for pregnancy test, result negative: Secondary | ICD-10-CM | POA: Diagnosis not present

## 2013-10-12 DIAGNOSIS — J449 Chronic obstructive pulmonary disease, unspecified: Secondary | ICD-10-CM | POA: Diagnosis not present

## 2013-10-12 DIAGNOSIS — Z880 Allergy status to penicillin: Secondary | ICD-10-CM | POA: Diagnosis not present

## 2013-10-12 DIAGNOSIS — J4489 Other specified chronic obstructive pulmonary disease: Secondary | ICD-10-CM | POA: Insufficient documentation

## 2013-10-12 DIAGNOSIS — E119 Type 2 diabetes mellitus without complications: Secondary | ICD-10-CM | POA: Insufficient documentation

## 2013-10-12 DIAGNOSIS — G43909 Migraine, unspecified, not intractable, without status migrainosus: Secondary | ICD-10-CM | POA: Diagnosis not present

## 2013-10-12 DIAGNOSIS — Z8612 Personal history of poliomyelitis: Secondary | ICD-10-CM | POA: Diagnosis not present

## 2013-10-12 DIAGNOSIS — I10 Essential (primary) hypertension: Secondary | ICD-10-CM | POA: Diagnosis not present

## 2013-10-12 DIAGNOSIS — I2510 Atherosclerotic heart disease of native coronary artery without angina pectoris: Secondary | ICD-10-CM | POA: Diagnosis not present

## 2013-10-12 DIAGNOSIS — R569 Unspecified convulsions: Secondary | ICD-10-CM

## 2013-10-12 DIAGNOSIS — F17200 Nicotine dependence, unspecified, uncomplicated: Secondary | ICD-10-CM | POA: Insufficient documentation

## 2013-10-12 DIAGNOSIS — Z792 Long term (current) use of antibiotics: Secondary | ICD-10-CM | POA: Diagnosis not present

## 2013-10-12 DIAGNOSIS — G40909 Epilepsy, unspecified, not intractable, without status epilepticus: Secondary | ICD-10-CM | POA: Diagnosis not present

## 2013-10-12 LAB — RAPID URINE DRUG SCREEN, HOSP PERFORMED
Amphetamines: NOT DETECTED
Barbiturates: NOT DETECTED
Benzodiazepines: NOT DETECTED
Cocaine: NOT DETECTED
Opiates: NOT DETECTED
Tetrahydrocannabinol: NOT DETECTED

## 2013-10-12 LAB — URINALYSIS, ROUTINE W REFLEX MICROSCOPIC
BILIRUBIN URINE: NEGATIVE
GLUCOSE, UA: NEGATIVE mg/dL
KETONES UR: NEGATIVE mg/dL
LEUKOCYTES UA: NEGATIVE
Nitrite: NEGATIVE
Protein, ur: NEGATIVE mg/dL
Specific Gravity, Urine: 1.01 (ref 1.005–1.030)
Urobilinogen, UA: 0.2 mg/dL (ref 0.0–1.0)
pH: 6 (ref 5.0–8.0)

## 2013-10-12 LAB — POC URINE PREG, ED: PREG TEST UR: NEGATIVE

## 2013-10-12 LAB — VALPROIC ACID LEVEL: Valproic Acid Lvl: <10 ug/mL — ABNORMAL LOW (ref 50.0–100.0)

## 2013-10-12 LAB — CBG MONITORING, ED: Glucose-Capillary: 163 mg/dL — ABNORMAL HIGH (ref 70–99)

## 2013-10-12 LAB — URINE MICROSCOPIC-ADD ON

## 2013-10-12 MED ORDER — VALPROATE SODIUM 500 MG/5ML IV SOLN
500.0000 mg | Freq: Once | INTRAVENOUS | Status: AC
  Administered 2013-10-12: 500 mg via INTRAVENOUS
  Filled 2013-10-12: qty 5

## 2013-10-12 MED ORDER — ACETAMINOPHEN 325 MG PO TABS
650.0000 mg | ORAL_TABLET | Freq: Once | ORAL | Status: AC
  Administered 2013-10-12: 650 mg via ORAL
  Filled 2013-10-12: qty 2

## 2013-10-12 MED ORDER — DIVALPROEX SODIUM ER 500 MG PO TB24: 1000.0000 mg | ORAL_TABLET | Freq: Every day | ORAL | Status: DC

## 2013-10-12 NOTE — ED Notes (Signed)
Pt ambulated to restroom unassisted with steady gait. Pt now wanting to go home. Dr. Cheri Guppy made aware

## 2013-10-12 NOTE — ED Notes (Signed)
Per EMS: [facility] with c/o seizures, upon EMS arrival pt was in full body seizure. 20 g started in right forearm, pt given 2.5 mg versed, pt post-ictle in transport. EMS reported some combativeness en route. Upon entering pt's room, pt started having full body gross movements attempting to remove EMS straps from backboard. Pt was given another 2.5 mg of EMS versed. Airway intact, no signs of oral trauma or incontinence noted.

## 2013-10-12 NOTE — ED Notes (Signed)
Called pt and told her we left her pillow at [facility].  She stated she would come and get it.

## 2013-10-12 NOTE — ED Provider Notes (Signed)
CSN: [id number]     Arrival date & time 10/12/13  0150 History   First MD Initiated Contact with Patient 10/12/13 0326     Chief Complaint  Patient presents with  . Seizures     (Consider location/radiation/quality/duration/timing/severity/associated sxs/prior Treatment) HPI This is a 48 year old woman with history of seizure disorder, hypertension, diabetes, heart disease and schizophrenia who is homeless. She is currently residing at local homeless shelter and was brought in by ambulance from the shoulder after she had a seizure. Apparently, the seizure was witnessed. It was self-limited. At home it occurred. The patient does not have recollection of the event. She says this is typical she is without complaints this time.    Past Medical History  Diagnosis Date  . Hypertension   . Seizures   . COPD (chronic obstructive pulmonary disease)   . Diabetes mellitus without complication   . Sleep apnea   . Coronary artery disease   . Schizophrenia   . Migraine   . Addiction to drug   . Cancer throat  . Throat cancer    Past Surgical History  Procedure Laterality Date  . Abdominal hysterectomy    . Appendectomy    . Cholecystectomy    . Eye surgery    . Cesarean section    . Knee surgery     Family History  Problem Relation Age of Onset  . Cancer Father    History  Substance Use Topics  . Smoking status: Current Every Day Smoker -- 0.50 packs/day for 30 years    Types: Cigarettes  . Smokeless tobacco: Never Used  . Alcohol Use: No   OB History   Grav Para Term Preterm Abortions TAB SAB Ect Mult Living                 Review of Systems  Ten point review of symptoms performed and is negative with the exception of symptoms noted above.   Allergies  Aspirin; Bee venom; Other; Toradol; Penicillins; and Vancomycin  Home Medications   Prior to Admission medications   Medication Sig Start Date End Date Taking? Authorizing Provider  cephALEXin (KEFLEX) 500 MG capsule  Take 1 capsule (500 mg total) by mouth 3 (three) times daily. 09/28/13   Hoy Morn, MD  divalproex (DEPAKOTE ER) 500 MG 24 hr tablet Take 1,000 mg by mouth 2 (two) times daily.    Historical Provider, MD  Lurasidone HCl (LATUDA) 20 MG TABS Take 20 mg by mouth daily.     Historical Provider, MD  metFORMIN (GLUCOPHAGE) 500 MG tablet Take 500 mg by mouth daily with breakfast.    Historical Provider, MD  nitroGLYCERIN (NITROSTAT) 0.4 MG SL tablet Place 0.4 mg under the tongue every 5 (five) minutes as needed for chest pain.    Historical Provider, MD  phenazopyridine (PYRIDIUM) 200 MG tablet Take 1 tablet (200 mg total) by mouth 3 (three) times daily. 09/28/13   Hoy Morn, MD   BP 107/61  Pulse 84  Resp 26  SpO2 98% Physical Exam Gen: well developed and well nourished appearing Head: NCAT Eyes: PERL, EOMI Nose: no epistaixis or rhinorrhea Mouth/throat: mucosa is moist and pink Neck: No C-spine tenderness Lungs: CTA B, no wheezing, rhonchi or rales CV: RRR, no murmur, extremities appear well perfused.  Abd: soft, notender, nondistended Back: No midline tenderness Skin: warm and dry Ext: normal to inspection, no dependent edema Neuro: CN ii-xii grossly intact, no focal deficits Psyche; normal affect,  calm and cooperative.  ED Course  Procedures (including critical care time) Labs Review  Results for orders placed during the hospital encounter of 10/12/13 (from the past 24 hour(s))  URINALYSIS, ROUTINE W REFLEX MICROSCOPIC     Status: Abnormal   Collection Time    10/12/13  2:15 AM      Result Value Ref Range   Color, Urine YELLOW  YELLOW   APPearance CLEAR  CLEAR   Specific Gravity, Urine 1.010  1.005 - 1.030   pH 6.0  5.0 - 8.0   Glucose, UA NEGATIVE  NEGATIVE mg/dL   Hgb urine dipstick SMALL (*) NEGATIVE   Bilirubin Urine NEGATIVE  NEGATIVE   Ketones, ur NEGATIVE  NEGATIVE mg/dL   Protein, ur NEGATIVE  NEGATIVE mg/dL   Urobilinogen, UA 0.2  0.0 - 1.0 mg/dL    Nitrite NEGATIVE  NEGATIVE   Leukocytes, UA NEGATIVE  NEGATIVE  URINE RAPID DRUG SCREEN (HOSP PERFORMED)     Status: None   Collection Time    10/12/13  2:15 AM      Result Value Ref Range   Opiates NONE DETECTED  NONE DETECTED   Cocaine NONE DETECTED  NONE DETECTED   Benzodiazepines NONE DETECTED  NONE DETECTED   Amphetamines NONE DETECTED  NONE DETECTED   Tetrahydrocannabinol NONE DETECTED  NONE DETECTED   Barbiturates NONE DETECTED  NONE DETECTED  URINE MICROSCOPIC-ADD ON     Status: Abnormal   Collection Time    10/12/13  2:15 AM      Result Value Ref Range   Squamous Epithelial / LPF FEW (*) RARE   WBC, UA 0-2  <3 WBC/hpf   RBC / HPF 0-2  <3 RBC/hpf  CBG MONITORING, ED     Status: Abnormal   Collection Time    10/12/13  2:56 AM      Result Value Ref Range   Glucose-Capillary 163 (*) 70 - 99 mg/dL   Comment 1 Notify RN     Comment 2 Documented in Chart    POC URINE PREG, ED     Status: None   Collection Time    10/12/13  3:06 AM      Result Value Ref Range   Preg Test, Ur NEGATIVE  NEGATIVE  VALPROIC ACID LEVEL     Status: Abnormal   Collection Time    10/12/13  3:48 AM      Result Value Ref Range   Valproic Acid Lvl <10.0 (*) 50.0 - 100.0 ug/mL    Imaging Review Dg Chest 2 View  10/11/2013   CLINICAL DATA:  Sternal pain  EXAM: CHEST  2 VIEW  COMPARISON:  Prior chest x-ray 08/21/2013  FINDINGS: The lungs are clear and negative for focal airspace consolidation, pulmonary edema or suspicious pulmonary nodule. No pleural effusion or pneumothorax. Cardiac and mediastinal contours are within normal limits. No acute fracture or lytic or blastic osseous lesions. The visualized upper abdominal bowel gas pattern is unremarkable. Surgical clips in the right upper quadrant suggest prior cholecystectomy.  IMPRESSION: No active cardiopulmonary disease.   Electronically Signed   By: Jacqulynn Cadet M.D.   On: 10/11/2013 11:19      MDM   Patient found to have undetectable  valproate level. She now says she has run out of her meds. We have loaded with depakote 500mg  IV and will prescribe depakote and previous dose. Patient told to f/u with her PCP tomorrow.    Elyn Peers, MD 10/12/13 [phone]

## 2013-10-12 NOTE — Discharge Instructions (Signed)

## 2013-10-14 ENCOUNTER — Emergency Department (HOSPITAL_COMMUNITY)
Admission: EM | Admit: 2013-10-14 | Discharge: 2013-10-14 | Disposition: A | Discharge status: Home / Self Care | Payer: Medicaid Other | Attending: Emergency Medicine | Admitting: Emergency Medicine

## 2013-10-14 ENCOUNTER — Emergency Department (HOSPITAL_COMMUNITY): Payer: Medicaid Other

## 2013-10-14 ENCOUNTER — Encounter (HOSPITAL_COMMUNITY): Payer: Self-pay | Admitting: Emergency Medicine

## 2013-10-14 DIAGNOSIS — J441 Chronic obstructive pulmonary disease with (acute) exacerbation: Secondary | ICD-10-CM | POA: Diagnosis not present

## 2013-10-14 DIAGNOSIS — Z792 Long term (current) use of antibiotics: Secondary | ICD-10-CM | POA: Insufficient documentation

## 2013-10-14 DIAGNOSIS — I2510 Atherosclerotic heart disease of native coronary artery without angina pectoris: Secondary | ICD-10-CM | POA: Insufficient documentation

## 2013-10-14 DIAGNOSIS — G43909 Migraine, unspecified, not intractable, without status migrainosus: Secondary | ICD-10-CM | POA: Diagnosis not present

## 2013-10-14 DIAGNOSIS — I10 Essential (primary) hypertension: Secondary | ICD-10-CM | POA: Insufficient documentation

## 2013-10-14 DIAGNOSIS — Z880 Allergy status to penicillin: Secondary | ICD-10-CM | POA: Diagnosis not present

## 2013-10-14 DIAGNOSIS — F17200 Nicotine dependence, unspecified, uncomplicated: Secondary | ICD-10-CM | POA: Diagnosis not present

## 2013-10-14 DIAGNOSIS — J40 Bronchitis, not specified as acute or chronic: Secondary | ICD-10-CM

## 2013-10-14 DIAGNOSIS — Z85819 Personal history of malignant neoplasm of unspecified site of lip, oral cavity, and pharynx: Secondary | ICD-10-CM | POA: Insufficient documentation

## 2013-10-14 DIAGNOSIS — Z79899 Other long term (current) drug therapy: Secondary | ICD-10-CM | POA: Diagnosis not present

## 2013-10-14 DIAGNOSIS — E119 Type 2 diabetes mellitus without complications: Secondary | ICD-10-CM | POA: Insufficient documentation

## 2013-10-14 DIAGNOSIS — G40909 Epilepsy, unspecified, not intractable, without status epilepticus: Secondary | ICD-10-CM | POA: Insufficient documentation

## 2013-10-14 DIAGNOSIS — Z8659 Personal history of other mental and behavioral disorders: Secondary | ICD-10-CM | POA: Insufficient documentation

## 2013-10-14 DIAGNOSIS — R4182 Altered mental status, unspecified: Secondary | ICD-10-CM | POA: Diagnosis not present

## 2013-10-14 DIAGNOSIS — R569 Unspecified convulsions: Secondary | ICD-10-CM

## 2013-10-14 LAB — CBC
HCT: 41.7 % (ref 36.0–46.0)
HEMOGLOBIN: 13.9 g/dL (ref 12.0–15.0)
MCH: 30.8 pg (ref 26.0–34.0)
MCHC: 33.3 g/dL (ref 30.0–36.0)
MCV: 92.5 fL (ref 78.0–100.0)
Platelets: 197 10*3/uL (ref 150–400)
RBC: 4.51 MIL/uL (ref 3.87–5.11)
RDW: 12.7 % (ref 11.5–15.5)
WBC: 8.5 10*3/uL (ref 4.0–10.5)

## 2013-10-14 LAB — BASIC METABOLIC PANEL
Anion gap: 11 (ref 5–15)
BUN: 14 mg/dL (ref 6–23)
CHLORIDE: 101 meq/L (ref 96–112)
CO2: 26 mEq/L (ref 19–32)
Calcium: 9.7 mg/dL (ref 8.4–10.5)
Creatinine, Ser: 0.58 mg/dL (ref 0.50–1.10)
GFR calc Af Amer: >90 mL/min (ref >90)
GLUCOSE: 142 mg/dL — AB (ref 70–99)
Potassium: 4.1 mEq/L (ref 3.7–5.3)
Sodium: 138 mEq/L (ref 137–147)

## 2013-10-14 LAB — CBG MONITORING, ED: Glucose-Capillary: 139 mg/dL — ABNORMAL HIGH (ref 70–99)

## 2013-10-14 LAB — VALPROIC ACID LEVEL: Valproic Acid Lvl: <10 ug/mL — ABNORMAL LOW (ref 50.0–100.0)

## 2013-10-14 MED ORDER — AZITHROMYCIN 250 MG PO TABS: ORAL_TABLET | ORAL | Status: DC

## 2013-10-14 MED ORDER — VALPROATE SODIUM 500 MG/5ML IV SOLN
500.0000 mg | Freq: Once | INTRAVENOUS | Status: AC
  Administered 2013-10-14: 500 mg via INTRAVENOUS
  Filled 2013-10-14: qty 5

## 2013-10-14 MED ORDER — LORAZEPAM 2 MG/ML IJ SOLN
1.0000 mg | Freq: Once | INTRAMUSCULAR | Status: AC
  Administered 2013-10-14: 1 mg via INTRAVENOUS
  Filled 2013-10-14: qty 1

## 2013-10-14 MED ORDER — ACETAMINOPHEN 325 MG PO TABS
650.0000 mg | ORAL_TABLET | Freq: Once | ORAL | Status: AC
  Administered 2013-10-14: 650 mg via ORAL
  Filled 2013-10-14: qty 2

## 2013-10-14 MED ORDER — ALBUTEROL SULFATE (2.5 MG/3ML) 0.083% IN NEBU
5.0000 mg | INHALATION_SOLUTION | Freq: Once | RESPIRATORY_TRACT | Status: AC
  Administered 2013-10-14: 5 mg via RESPIRATORY_TRACT
  Filled 2013-10-14: qty 6

## 2013-10-14 NOTE — Discharge Instructions (Signed)
Seizure, Adult A seizure is abnormal electrical activity in the brain. Seizures usually last from 30 seconds to 2 minutes. There are various types of seizures. Before a seizure, you may have a warning sensation (aura) that a seizure is about to occur. An aura may include the following symptoms:   Fear or anxiety.  Nausea.  Feeling like the room is spinning (vertigo).  Vision changes, such as seeing flashing lights or spots. Common symptoms during a seizure include:  A change in attention or behavior (altered mental status).  Convulsions with rhythmic jerking movements.  Drooling.  Rapid eye movements.  Grunting.  Loss of bladder and bowel control.  Bitter taste in the mouth.  Tongue biting. After a seizure, you may feel confused and sleepy. You may also have an injury resulting from convulsions during the seizure. HOME CARE INSTRUCTIONS   If you are given medicines, take them exactly as prescribed by your health care provider.  Keep all follow-up appointments as directed by your health care provider.  Do not swim or drive or engage in risky activity during which a seizure could cause further injury to you or others until your health care provider says it is OK.  Get adequate rest.  Teach friends and family what to do if you have a seizure. They should:  Lay you on the ground to prevent a fall.  Put a cushion under your head.  Loosen any tight clothing around your neck.  Turn you on your side. If vomiting occurs, this helps keep your airway clear.  Stay with you until you recover.  Know whether or not you need emergency care. SEEK IMMEDIATE MEDICAL CARE IF:  The seizure lasts longer than 5 minutes.  The seizure is severe or you do not wake up immediately after the seizure.  You have an altered mental status after the seizure.  You are having more frequent or worsening seizures. Someone should drive you to the emergency department or call local emergency  services (911 in [place] MAKE SURE YOU:  Understand these instructions.  Will watch your condition.  Will get help right away if you are not doing well or get worse. Document Released: 03/06/2000 Document Revised: 12/28/2012 Document Reviewed: 10/19/2012 [facility] Patient Information 2015 [place], [place]. This information is not intended to replace advice given to you by your health care provider. Make sure you discuss any questions you have with your health care provider.  Upper Respiratory Infection, Adult An upper respiratory infection (URI) is also known as the common cold. It is often caused by a type of germ (virus). Colds are easily spread (contagious). You can pass it to others by kissing, coughing, sneezing, or drinking out of the same glass. Usually, you get better in 1 or 2 weeks.  HOME CARE   Only take medicine as told by your doctor.  Use a warm mist humidifier or breathe in steam from a hot shower.  Drink enough water and fluids to keep your pee (urine) clear or pale yellow.  Get plenty of rest.  Return to work when your temperature is back to normal or as told by your doctor. You may use a face mask and wash your hands to stop your cold from spreading. GET HELP RIGHT AWAY IF:   After the first few days, you feel you are getting worse.  You have questions about your medicine.  You have chills, shortness of breath, or brown or red spit (mucus).  You have yellow or brown snot (nasal  discharge) or pain in the face, especially when you bend forward.  You have a fever, puffy (swollen) neck, pain when you swallow, or white spots in the back of your throat.  You have a bad headache, ear pain, sinus pain, or chest pain.  You have a high-pitched whistling sound when you breathe in and out (wheezing).  You have a lasting cough or cough up blood.  You have sore muscles or a stiff neck. MAKE SURE YOU:   Understand these instructions.  Will watch your condition.  Will  get help right away if you are not doing well or get worse. Document Released: 08/26/2007 Document Revised: 06/01/2011 Document Reviewed: 06/14/2013 [facility] Patient Information 2015 [place], [place]. This information is not intended to replace advice given to you by your health care provider. Make sure you discuss any questions you have with your health care provider.

## 2013-10-14 NOTE — Progress Notes (Signed)
CARE MANAGEMENT NOTE 10/14/2013  Patient:  Carol Hughes, Carol Hughes   Account Number:  [id number]  Date Initiated:  10/14/2013  Documentation initiated by:  [facility]  Subjective/Objective Assessment:   seizure     Action/Plan:   lives at a shelter   Anticipated DC Date:  10/14/2013   Anticipated DC Plan:  HOME/SELF CARE  In-house referral  Clinical Social Worker      DC Planning Services  CM consult  Medication Assistance      Choice offered to / List presented to:             Status of service:  Completed, signed off Medicare Important Message given?   (If response is "NO", the following Medicare IM given date fields will be blank) Date Medicare IM given:   Medicare IM given by:   Date Additional Medicare IM given:   Additional Medicare IM given by:    Discharge Disposition:  HOME/SELF CARE  Per UR Regulation:    If discussed at Long Length of Stay Meetings, dates discussed:    Comments:  10/14/2013 1815 Medication was picked up from [place] on [address] # [phone]. Copay $3.00 was wavied by pharmacist. Jonnie Finner RN CCM Case Mgmt phone [phone]  10/14/2013 1700 NCM attempted to speak to pt and she was very groggy. Pt states she uses [facility] pharmacy. Gave NCM permission to contact pharmacy. States she cannot afford her medications. Pt states she pays $3.00 at the pharmacy for her medications. Pt states she does have a source of income. She gets an SSI check monthly and will receive her next check August 1. NCM notified CSW for assistance with shelter. Explained no assistance programs to assist wit copay cost. Encouraged her to continue to follow up with [employer] for assistance.  Jonnie Finner RN CCM Case Mgmt phone [phone]

## 2013-10-14 NOTE — ED Notes (Signed)
PA and pt friends at bedside. PA requested to give pt crackers and drink which was provided. Pt now sitting up, awake and talking to friends eating and drinking her snack

## 2013-10-14 NOTE — ED Notes (Signed)
Pt from a clinic via EMS post-ictal from witnesses seizure. Pt has hx of the same. When asking pt is she is taking her seizure meds, pt shook head yes. Pt is non-verbal at this time but responds to voice and questions. Pt in NAD

## 2013-10-14 NOTE — ED Notes (Signed)
Respiratory called

## 2013-10-14 NOTE — ED Notes (Signed)
Respiratory at bedside.

## 2013-10-14 NOTE — ED Notes (Signed)
She was seen to have a seizure at [facility] (a local community medical clinic) which they stated lasted ~ 2 min.  She arrives here drowsy and in no distress, having received 5mg  Versed I.M. En route.  Her skin is normal, warm and dry and she is breathing normally.  She moves all extremities spontaneously.  She is essentially non-verbal, but attempts to speak.

## 2013-10-14 NOTE — ED Notes (Signed)
Bed: RESA Expected date:  Expected time:  Means of arrival:  Comments: EMS seizure 

## 2013-10-14 NOTE — ED Provider Notes (Signed)
CSN: [id number]     Arrival date & time 10/14/13  1318 History   First MD Initiated Contact with Patient 10/14/13 1327     Chief Complaint  Patient presents with  . Seizures     (Consider location/radiation/quality/duration/timing/severity/associated sxs/prior Treatment) HPI Comments: Patient is a 48 year old female with history of hypertension, seizures, COPD, diabetes, coronary artery disease, schizophrenia, throat cancer who presents to the emergency department today after having a witnessed seizure. She was at the ER or see when she slumped to the ground from a chair. According to EMS she had seizure-like activity which lasted 2 minutes. She is confused and postictal. History is limited due to patient's condition. She can tell me that she has not been taking her Depakote because she cannot afford it. She was seen in the emergency Department on 7/23 for seizures.  The history is provided by the patient. No language interpreter was used.   Past Medical History  Diagnosis Date  . Hypertension   . Seizures   . COPD (chronic obstructive pulmonary disease)   . Diabetes mellitus without complication   . Sleep apnea   . Coronary artery disease   . Schizophrenia   . Migraine   . Addiction to drug   . Cancer throat  . Throat cancer    Past Surgical History  Procedure Laterality Date  . Abdominal hysterectomy    . Appendectomy    . Cholecystectomy    . Eye surgery    . Cesarean section    . Knee surgery     Family History  Problem Relation Age of Onset  . Cancer Father    History  Substance Use Topics  . Smoking status: Current Every Day Smoker -- 0.50 packs/day for 30 years    Types: Cigarettes  . Smokeless tobacco: Never Used  . Alcohol Use: No   OB History   Grav Para Term Preterm Abortions TAB SAB Ect Mult Living                 Review of Systems  Unable to perform ROS: Mental status change      Allergies  Aspirin; Bee venom; Other; Toradol; Penicillins; and  Vancomycin  Home Medications   Prior to Admission medications   Medication Sig Start Date End Date Taking? Authorizing Provider  cephALEXin (KEFLEX) 500 MG capsule Take 1 capsule (500 mg total) by mouth 3 (three) times daily. 09/28/13  Yes Hoy Morn, MD  Lurasidone HCl (LATUDA) 20 MG TABS Take 20 mg by mouth daily.    Yes Historical Provider, MD  metFORMIN (GLUCOPHAGE) 500 MG tablet Take 500 mg by mouth daily with breakfast.   Yes Historical Provider, MD  nitroGLYCERIN (NITROSTAT) 0.4 MG SL tablet Place 0.4 mg under the tongue every 5 (five) minutes as needed for chest pain.   Yes Historical Provider, MD  divalproex (DEPAKOTE ER) 500 MG 24 hr tablet Take 1,000 mg by mouth 2 (two) times daily.    Historical Provider, MD  divalproex (DEPAKOTE ER) 500 MG 24 hr tablet Take 2 tablets (1,000 mg total) by mouth daily. 10/12/13   Elyn Peers, MD  phenazopyridine (PYRIDIUM) 200 MG tablet Take 1 tablet (200 mg total) by mouth 3 (three) times daily. 09/28/13   Hoy Morn, MD   BP 141/90  Pulse 60  Temp(Src) 98.2 F (36.8 C) (Oral)  Resp 17  SpO2 94% Physical Exam  Nursing note and vitals reviewed. Constitutional: She is oriented to person, place, and time. She  appears well-developed and well-nourished. No distress.  HENT:  Head: Normocephalic and atraumatic.  Right Ear: External ear normal.  Left Ear: External ear normal.  Nose: Nose normal.  Mouth/Throat: Oropharynx is clear and moist.  No broken or loose teeth. No tongue laceration  Eyes: Conjunctivae and EOM are normal. Pupils are equal, round, and reactive to light.  Neck: Normal range of motion.  Cardiovascular: Normal rate, regular rhythm, normal heart sounds, intact distal pulses and normal pulses.   Pulses:      Radial pulses are 2+ on the right side, and 2+ on the left side.       Posterior tibial pulses are 2+ on the right side, and 2+ on the left side.  Pulmonary/Chest: Effort normal. No stridor. No respiratory distress. She  has wheezes. She has no rales.  Abdominal: Soft. She exhibits no distension. There is no tenderness.  Musculoskeletal: Normal range of motion.  Neurological: She is alert and oriented to person, place, and time. She has normal strength.  Grip strength 5/5 bilaterally Follows commands  Skin: Skin is warm and dry. She is not diaphoretic. No erythema.  Psychiatric: She has a normal mood and affect. Her behavior is normal.    ED Course  Procedures (including critical care time) Labs Review Labs Reviewed  BASIC METABOLIC PANEL - Abnormal; Notable for the following:    Glucose, Bld 142 (*)    All other components within normal limits  VALPROIC ACID LEVEL - Abnormal; Notable for the following:    Valproic Acid Lvl <10.0 (*)    All other components within normal limits  CBG MONITORING, ED - Abnormal; Notable for the following:    Glucose-Capillary 139 (*)    All other components within normal limits  CBC    Imaging Review Ct Head Wo Contrast  10/14/2013   CLINICAL DATA:  Seizure  EXAM: CT HEAD WITHOUT CONTRAST  TECHNIQUE: Contiguous axial images were obtained from the base of the skull through the vertex without intravenous contrast.  COMPARISON:  June 30, 2013  FINDINGS: The ventricles are normal in size and configuration. The right lateral ventricle is slightly larger than the left lateral ventricle, an anatomic variant. There is no mass, hemorrhage, extra-axial fluid collection, or midline shift. Gray-white compartments are normal. The bony calvarium appears intact. The mastoid air cells are clear.  IMPRESSION: Study within normal limits. No demonstrable mass, hemorrhage, or acute appearing infarct.   Electronically Signed   By: Lowella Grip M.D.   On: 10/14/2013 15:56   Dg Chest Port 1 View  10/14/2013   CLINICAL DATA:  Wheezing.  History of COPD.  EXAM: PORTABLE CHEST - 1 VIEW  COMPARISON:  Chest x-ray 10/11/2013.  FINDINGS: Lung volumes are slightly low. Diffuse peribronchial  cuffing. No consolidative airspace disease. No pleural effusions. Cephalization of the pulmonary vasculature. No frank pulmonary edema. Cardiac size appears borderline to mildly enlarged, however, this is likely accentuated by low lung volumes, lordotic positioning and portable AP technique. Upper mediastinal contours are unremarkable.  IMPRESSION: 1. Diffuse peribronchial cuffing. This could suggest an acute bronchitis. 2. Pulmonary venous congestion, but no frank pulmonary edema.   Electronically Signed   By: Vinnie Langton M.D.   On: 10/14/2013 15:29     EKG Interpretation None      4:26 PM Discussed with case manager who will see patient.   MDM   Final diagnoses:  Seizure    Patient presents to ED after having a witnessed seizure. Seizure lasted 2  minutes. No additional seizure activity in ED. Patient received 5mg  Versed, 1mg  Ativan, and 500 mg of Depakote. Patient has remained sleepy in ED, but appears to be returning to baseline. Patient recognizes visitors and is currently eating and drinking. It appears patient is close to baseline mental status. Currently awaiting case manager to attempt to help patient get Depakote as noncompliance appears to be patient's main issue. Discussed reasons to return to ED with patient and visitors. Patient signed out to Kerr, PA-C at change of shift for further monitoring.   Patient also with bronchitis. Will give azithromycin.   Elwyn Lade, PA-C 10/14/13 1712

## 2013-10-14 NOTE — ED Notes (Signed)
Pt requested more food and was provided with a sandwich

## 2013-10-14 NOTE — ED Notes (Signed)
Pt answered her phone when it rang and is telling someone to come and see her. Pt in NAD

## 2013-10-14 NOTE — ED Provider Notes (Signed)
Handoff from [facility] at shift change.   Case manager is assisting with obtaining depakote for patient.   Patient is also drowsy from the benzo medication she received earlier. Once she has meds, has eaten, walked -- she can be discharged to home.   6:37 PM Patient seen. She has received 1 month supply of Depakote. She is alert and eating. Will walk and likely discharge. Nurse notes improvement in mentation.   7:11 PM Patient has ambulated. Will discharge.   Carlisle Cater, PA-C 10/14/13 1911

## 2013-10-14 NOTE — Progress Notes (Signed)
CARE MANAGEMENT NOTE 10/14/2013  Patient:  Carol Hughes, Carol Hughes   Account Number:  [id number]  Date Initiated:  10/14/2013  Documentation initiated by:  [facility]  Subjective/Objective Assessment:   seizure     Action/Plan:   lives at a shelter   Anticipated DC Date:  10/14/2013   Anticipated DC Plan:  HOME/SELF CARE  In-house referral  Clinical Social Worker      DC Planning Services  CM consult  Medication Assistance      Choice offered to / List presented to:             Status of service:  Completed, signed off Medicare Important Message given?   (If response is "NO", the following Medicare IM given date fields will be blank) Date Medicare IM given:   Medicare IM given by:   Date Additional Medicare IM given:   Additional Medicare IM given by:    Discharge Disposition:  HOME/SELF CARE  Per UR Regulation:    If discussed at Long Length of Stay Meetings, dates discussed:    Comments:  10/14/2013 Bosie Helper Female friend, Louie Casa was asked to leave the room while discussing pt's medical condition. NCM educated pt on the importance of getting meds from one pharmacy to establish a rapport. Explained that most pharmacies will give a waiver of copay for Medicaid if she states she cannot afford medication. Pt's RN will complete dc instructions and give Depakote to pt. Pt states she knows how to take her meds. Explained importance of compliance to reduce complications with her health. Pt states she is staying at the [facility] and can catch the bus back to the shelter this evening. States per shelter's policy she is not to tell any female friends her current location for her protection. Pt will be provided a bus pass to get back to shelter. Jonnie Finner RN CCM Case Mgmt phone [phone]  10/14/2013 1815 Medication was picked up from [place] on [address] # [phone]. Copay $3.00 was wavied by pharmacist. Jonnie Finner RN CCM Case Mgmt phone  [phone]  10/14/2013 1700 NCM attempted to speak to pt and she was very groggy. Pt states she uses [facility] pharmacy. Gave NCM permission to contact pharmacy. States she cannot afford her medications. Pt states she pays $3.00 at the pharmacy for her medications. Pt states she does have a source of income. She gets an SSI check monthly and will receive her next check August 1. NCM notified CSW for assistance with shelter. Explained no assistance programs to assist wit copay cost. Encouraged her to continue to follow up with [employer] for assistance.  Jonnie Finner RN CCM Case Mgmt phone [phone]

## 2013-10-18 NOTE — ED Provider Notes (Signed)
Medical screening examination/treatment/procedure(s) were performed by non-physician practitioner and as supervising physician I was immediately available for consultation/collaboration.   EKG Interpretation None       Virgel Manifold, MD 10/18/13 1419

## 2013-10-18 NOTE — ED Provider Notes (Signed)
Medical screening examination/treatment/procedure(s) were performed by non-physician practitioner and as supervising physician I was immediately available for consultation/collaboration.   EKG Interpretation None       Renae Mottley, MD 10/18/13 1346 

## 2013-11-07 ENCOUNTER — Emergency Department (HOSPITAL_COMMUNITY): Payer: Medicaid Other

## 2013-11-07 ENCOUNTER — Encounter (HOSPITAL_COMMUNITY): Payer: Self-pay | Admitting: Emergency Medicine

## 2013-11-07 ENCOUNTER — Emergency Department (HOSPITAL_COMMUNITY)
Admission: EM | Admit: 2013-11-07 | Discharge: 2013-11-07 | Disposition: A | Discharge status: Home / Self Care | Payer: Medicaid Other | Attending: Emergency Medicine | Admitting: Emergency Medicine

## 2013-11-07 DIAGNOSIS — J449 Chronic obstructive pulmonary disease, unspecified: Secondary | ICD-10-CM | POA: Diagnosis not present

## 2013-11-07 DIAGNOSIS — M5430 Sciatica, unspecified side: Secondary | ICD-10-CM | POA: Insufficient documentation

## 2013-11-07 DIAGNOSIS — M25559 Pain in unspecified hip: Secondary | ICD-10-CM | POA: Insufficient documentation

## 2013-11-07 DIAGNOSIS — J4489 Other specified chronic obstructive pulmonary disease: Secondary | ICD-10-CM | POA: Insufficient documentation

## 2013-11-07 DIAGNOSIS — F17200 Nicotine dependence, unspecified, uncomplicated: Secondary | ICD-10-CM | POA: Diagnosis not present

## 2013-11-07 DIAGNOSIS — R569 Unspecified convulsions: Secondary | ICD-10-CM | POA: Insufficient documentation

## 2013-11-07 DIAGNOSIS — I10 Essential (primary) hypertension: Secondary | ICD-10-CM | POA: Diagnosis not present

## 2013-11-07 DIAGNOSIS — Z85819 Personal history of malignant neoplasm of unspecified site of lip, oral cavity, and pharynx: Secondary | ICD-10-CM | POA: Diagnosis not present

## 2013-11-07 DIAGNOSIS — M5431 Sciatica, right side: Secondary | ICD-10-CM

## 2013-11-07 DIAGNOSIS — Z880 Allergy status to penicillin: Secondary | ICD-10-CM | POA: Diagnosis not present

## 2013-11-07 DIAGNOSIS — E119 Type 2 diabetes mellitus without complications: Secondary | ICD-10-CM | POA: Insufficient documentation

## 2013-11-07 DIAGNOSIS — Z79899 Other long term (current) drug therapy: Secondary | ICD-10-CM | POA: Insufficient documentation

## 2013-11-07 DIAGNOSIS — I2510 Atherosclerotic heart disease of native coronary artery without angina pectoris: Secondary | ICD-10-CM | POA: Insufficient documentation

## 2013-11-07 MED ORDER — PREDNISONE 10 MG PO TABS: 20.0000 mg | ORAL_TABLET | Freq: Every day | ORAL | Status: DC

## 2013-11-07 MED ORDER — NAPROXEN 500 MG PO TABS: 500.0000 mg | ORAL_TABLET | Freq: Two times a day (BID) | ORAL | Status: DC

## 2013-11-07 MED ORDER — OXYCODONE-ACETAMINOPHEN 5-325 MG PO TABS
1.0000 | ORAL_TABLET | Freq: Once | ORAL | Status: AC
  Administered 2013-11-07: 1 via ORAL
  Filled 2013-11-07: qty 1

## 2013-11-07 MED ORDER — HYDROCODONE-ACETAMINOPHEN 5-325 MG PO TABS: 1.0000 | ORAL_TABLET | Freq: Four times a day (QID) | ORAL | Status: DC | PRN

## 2013-11-07 NOTE — Discharge Instructions (Signed)
Sciatica °Sciatica is pain, weakness, numbness, or tingling along your sciatic nerve. The nerve starts in the lower back and runs down the back of each leg. Nerve damage or certain conditions pinch or put pressure on the sciatic nerve. This causes the pain, weakness, and other discomforts of sciatica. °HOME CARE  °· Only take medicine as told by your doctor. °· Apply ice to the affected area for 20 minutes. Do this 3-4 times a day for the first 48-72 hours. Then try heat in the same way. °· Exercise, stretch, or do your usual activities if these do not make your pain worse. °· Go to physical therapy as told by your doctor. °· Keep all doctor visits as told. °· Do not wear high heels or shoes that are not supportive. °· Get a firm mattress if your mattress is too soft to lessen pain and discomfort. °GET HELP RIGHT AWAY IF:  °· You cannot control when you poop (bowel movement) or pee (urinate). °· You have more weakness in your lower back, lower belly (pelvis), butt (buttocks), or legs. °· You have redness or puffiness (swelling) of your back. °· You have a burning feeling when you pee. °· You have pain that gets worse when you lie down. °· You have pain that wakes you from your sleep. °· Your pain is worse than past pain. °· Your pain lasts longer than 4 weeks. °· You are suddenly losing weight without reason. °MAKE SURE YOU:  °· Understand these instructions. °· Will watch this condition. °· Will get help right away if you are not doing well or get worse. °Document Released: 12/17/2007 Document Revised: 09/08/2011 Document Reviewed: 07/19/2011 °[employer]® Patient Information ©2015 [employer]. This information is not intended to replace advice given to you by your health care provider. Make sure you discuss any questions you have with your health care provider. ° °

## 2013-11-07 NOTE — ED Notes (Signed)
Patient refused wheelchair. 

## 2013-11-07 NOTE — ED Provider Notes (Signed)
CSN: [id number]     Arrival date & time 11/07/13  1556 History  This chart was scribed for non-physician practitioner, Etta Quill, NP working with Wandra Arthurs, MD by Frederich Balding, ED scribe. This patient was seen in room TR04C/TR04C and the patient's care was started at 5:32 PM.    Chief Complaint  Patient presents with  . Hip Pain   The history is provided by the patient. No language interpreter was used.   HPI Comments: Carol Hughes is a 48 y.o. female who presents to the Emergency Department complaining of gradual onset right lower back pain that radiates into her hip, down to her knee that started 3 days ago. Denies specific injury. Bearing weight and certain movements worsen the pain. Pt has taken tylenol and 80 mg ibuprofen with no relief. She was told 15 years ago that she would need hip surgery but told them she didn't want it.   Past Medical History  Diagnosis Date  . Hypertension   . Seizures   . COPD (chronic obstructive pulmonary disease)   . Diabetes mellitus without complication   . Sleep apnea   . Coronary artery disease   . Schizophrenia   . Migraine   . Addiction to drug   . Cancer throat  . Throat cancer    Past Surgical History  Procedure Laterality Date  . Abdominal hysterectomy    . Appendectomy    . Cholecystectomy    . Eye surgery    . Cesarean section    . Knee surgery     Family History  Problem Relation Age of Onset  . Cancer Father    History  Substance Use Topics  . Smoking status: Current Every Day Smoker -- 0.50 packs/day for 30 years    Types: Cigarettes  . Smokeless tobacco: Never Used  . Alcohol Use: No   OB History   Grav Para Term Preterm Abortions TAB SAB Ect Mult Living                 Review of Systems  Musculoskeletal: Positive for arthralgias, back pain and myalgias.  All other systems reviewed and are negative.  Allergies  Aspirin; Bee venom; Other; Toradol; Penicillins; and Vancomycin  Home Medications   Prior  to Admission medications   Medication Sig Start Date End Date Taking? Authorizing Provider  azithromycin (ZITHROMAX Z-PAK) 250 MG tablet 2 po day one, then 1 daily x 4 days 10/14/13   Elwyn Lade, PA-C  cephALEXin (KEFLEX) 500 MG capsule Take 1 capsule (500 mg total) by mouth 3 (three) times daily. 09/28/13   Hoy Morn, MD  divalproex (DEPAKOTE ER) 500 MG 24 hr tablet Take 1,000 mg by mouth 2 (two) times daily.    Historical Provider, MD  divalproex (DEPAKOTE ER) 500 MG 24 hr tablet Take 2 tablets (1,000 mg total) by mouth daily. 10/12/13   Elyn Peers, MD  Lurasidone HCl (LATUDA) 20 MG TABS Take 20 mg by mouth daily.     Historical Provider, MD  metFORMIN (GLUCOPHAGE) 500 MG tablet Take 500 mg by mouth daily with breakfast.    Historical Provider, MD  nitroGLYCERIN (NITROSTAT) 0.4 MG SL tablet Place 0.4 mg under the tongue every 5 (five) minutes as needed for chest pain.    Historical Provider, MD  phenazopyridine (PYRIDIUM) 200 MG tablet Take 1 tablet (200 mg total) by mouth 3 (three) times daily. 09/28/13   Hoy Morn, MD   BP 91/72  Pulse 85  Temp(Src) 98.8 F (37.1 C) (Oral)  Resp 20  SpO2 99%  Physical Exam  Nursing note and vitals reviewed. Constitutional: She is oriented to person, place, and time. She appears well-developed and well-nourished. No distress.  HENT:  Head: Normocephalic and atraumatic.  Eyes: Conjunctivae and EOM are normal.  Neck: Neck supple. No tracheal deviation present.  Cardiovascular: Normal rate.   Pulmonary/Chest: Effort normal. No respiratory distress.  Musculoskeletal: Normal range of motion.  Decreased strength in right lower extremity due to pain. Able to ambulate without difficulty. Tenderness along lumbar spine, which is chronic.  Neurological: She is alert and oriented to person, place, and time.  Skin: Skin is warm and dry.  Psychiatric: She has a normal mood and affect. Her behavior is normal.    ED Course  Procedures (including  critical care time)  DIAGNOSTIC STUDIES: Oxygen Saturation is 99% on RA, normal by my interpretation.    COORDINATION OF CARE: 5:34 PM-Discussed treatment plan which includes an anti-inflammatory, pain medication and a short course of steroids with pt at bedside and pt agreed to plan. Advised pt that the steroids can cause her sugars to elevate.  Labs Review Labs Reviewed - No data to display  Imaging Review Dg Hip Complete Right  11/07/2013   CLINICAL DATA:  Right hip pain 3 days  EXAM: RIGHT HIP - COMPLETE 2+ VIEW  COMPARISON:  Radiographs 10/28/2007  FINDINGS: Hips are located. There is mild osteophytosis of the roots of the acetabuli. Dedicated view of the right hip demonstrates no fracture dislocation.  IMPRESSION: Mild osteoarthritis hip joints. No acute findings. No change from prior.   Electronically Signed   By: Suzy Bouchard M.D.   On: 11/07/2013 17:27     EKG Interpretation None     Radiology results reviewed and shared with patient. MDM   Final diagnoses:  None    Sciatica. No red flag symptoms.  I personally performed the services described in this documentation, which was scribed in my presence. The recorded information has been reviewed and is accurate.  Norman Herrlich, NP 11/08/13 [phone]

## 2013-11-07 NOTE — ED Notes (Signed)
Pt c/o right hip pain with radiation down right leg x 3 days; pt sts painful to walk; pt denies obvious injury

## 2013-11-08 NOTE — ED Provider Notes (Signed)
Medical screening examination/treatment/procedure(s) were performed by non-physician practitioner and as supervising physician I was immediately available for consultation/collaboration.   EKG Interpretation None        Wandra Arthurs, MD 11/08/13 1045

## 2013-11-10 ENCOUNTER — Emergency Department (HOSPITAL_COMMUNITY)
Admission: EM | Admit: 2013-11-10 | Discharge: 2013-11-10 | Disposition: A | Discharge status: Home / Self Care | Payer: Medicaid Other
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
## 2013-12-15 ENCOUNTER — Emergency Department (HOSPITAL_COMMUNITY): Payer: Medicaid Other

## 2013-12-15 ENCOUNTER — Emergency Department (HOSPITAL_COMMUNITY)
Admission: EM | Admit: 2013-12-15 | Discharge: 2013-12-15 | Disposition: A | Discharge status: Home / Self Care | Payer: Medicaid Other | Attending: Emergency Medicine | Admitting: Emergency Medicine

## 2013-12-15 ENCOUNTER — Encounter (HOSPITAL_COMMUNITY): Payer: Self-pay | Admitting: Emergency Medicine

## 2013-12-15 DIAGNOSIS — G43909 Migraine, unspecified, not intractable, without status migrainosus: Secondary | ICD-10-CM | POA: Diagnosis not present

## 2013-12-15 DIAGNOSIS — Z85819 Personal history of malignant neoplasm of unspecified site of lip, oral cavity, and pharynx: Secondary | ICD-10-CM | POA: Insufficient documentation

## 2013-12-15 DIAGNOSIS — I2510 Atherosclerotic heart disease of native coronary artery without angina pectoris: Secondary | ICD-10-CM | POA: Diagnosis not present

## 2013-12-15 DIAGNOSIS — Z79899 Other long term (current) drug therapy: Secondary | ICD-10-CM | POA: Insufficient documentation

## 2013-12-15 DIAGNOSIS — I10 Essential (primary) hypertension: Secondary | ICD-10-CM | POA: Diagnosis not present

## 2013-12-15 DIAGNOSIS — J449 Chronic obstructive pulmonary disease, unspecified: Secondary | ICD-10-CM | POA: Diagnosis not present

## 2013-12-15 DIAGNOSIS — Z23 Encounter for immunization: Secondary | ICD-10-CM | POA: Insufficient documentation

## 2013-12-15 DIAGNOSIS — E119 Type 2 diabetes mellitus without complications: Secondary | ICD-10-CM | POA: Insufficient documentation

## 2013-12-15 DIAGNOSIS — F17200 Nicotine dependence, unspecified, uncomplicated: Secondary | ICD-10-CM | POA: Diagnosis not present

## 2013-12-15 DIAGNOSIS — G40909 Epilepsy, unspecified, not intractable, without status epilepticus: Secondary | ICD-10-CM | POA: Insufficient documentation

## 2013-12-15 DIAGNOSIS — IMO0002 Reserved for concepts with insufficient information to code with codable children: Secondary | ICD-10-CM | POA: Insufficient documentation

## 2013-12-15 DIAGNOSIS — J4489 Other specified chronic obstructive pulmonary disease: Secondary | ICD-10-CM | POA: Insufficient documentation

## 2013-12-15 DIAGNOSIS — S0993XA Unspecified injury of face, initial encounter: Secondary | ICD-10-CM | POA: Insufficient documentation

## 2013-12-15 DIAGNOSIS — M549 Dorsalgia, unspecified: Secondary | ICD-10-CM

## 2013-12-15 DIAGNOSIS — S199XXA Unspecified injury of neck, initial encounter: Principal | ICD-10-CM

## 2013-12-15 DIAGNOSIS — E669 Obesity, unspecified: Secondary | ICD-10-CM | POA: Insufficient documentation

## 2013-12-15 DIAGNOSIS — M542 Cervicalgia: Secondary | ICD-10-CM

## 2013-12-15 DIAGNOSIS — Z880 Allergy status to penicillin: Secondary | ICD-10-CM | POA: Diagnosis not present

## 2013-12-15 HISTORY — DX: Obesity, unspecified: E66.9

## 2013-12-15 MED ORDER — TETANUS-DIPHTH-ACELL PERTUSSIS 5-2.5-18.5 LF-MCG/0.5 IM SUSP
0.5000 mL | Freq: Once | INTRAMUSCULAR | Status: AC
  Administered 2013-12-15: 0.5 mL via INTRAMUSCULAR
  Filled 2013-12-15: qty 0.5

## 2013-12-15 MED ORDER — HYDROCODONE-ACETAMINOPHEN 5-325 MG PO TABS
2.0000 | ORAL_TABLET | Freq: Once | ORAL | Status: AC
  Administered 2013-12-15: 2 via ORAL
  Filled 2013-12-15: qty 2

## 2013-12-15 MED ORDER — HYDROCODONE-ACETAMINOPHEN 5-325 MG PO TABS: 2.0000 | ORAL_TABLET | ORAL | Status: DC | PRN

## 2013-12-15 MED ORDER — CYCLOBENZAPRINE HCL 10 MG PO TABS: 10.0000 mg | ORAL_TABLET | Freq: Three times a day (TID) | ORAL | Status: DC | PRN
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
## 2013-12-15 NOTE — ED Provider Notes (Signed)
Medical screening examination/treatment/procedure(s) were performed by non-physician practitioner and as supervising physician I was immediately available for consultation/collaboration.    Johnna Acosta, MD 12/15/13 1910
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
## 2013-12-25 ENCOUNTER — Emergency Department (HOSPITAL_COMMUNITY)
Admission: EM | Admit: 2013-12-25 | Discharge: 2013-12-25 | Disposition: A | Discharge status: Home / Self Care | Payer: Medicaid Other | Attending: Emergency Medicine | Admitting: Emergency Medicine

## 2013-12-25 ENCOUNTER — Encounter (HOSPITAL_COMMUNITY): Payer: Self-pay | Admitting: Emergency Medicine

## 2013-12-25 DIAGNOSIS — E119 Type 2 diabetes mellitus without complications: Secondary | ICD-10-CM | POA: Diagnosis not present

## 2013-12-25 DIAGNOSIS — H0589 Other disorders of orbit: Secondary | ICD-10-CM | POA: Diagnosis not present

## 2013-12-25 DIAGNOSIS — Z7982 Long term (current) use of aspirin: Secondary | ICD-10-CM | POA: Insufficient documentation

## 2013-12-25 DIAGNOSIS — F209 Schizophrenia, unspecified: Secondary | ICD-10-CM | POA: Diagnosis not present

## 2013-12-25 DIAGNOSIS — Z7952 Long term (current) use of systemic steroids: Secondary | ICD-10-CM | POA: Diagnosis not present

## 2013-12-25 DIAGNOSIS — Z85818 Personal history of malignant neoplasm of other sites of lip, oral cavity, and pharynx: Secondary | ICD-10-CM | POA: Diagnosis not present

## 2013-12-25 DIAGNOSIS — G43909 Migraine, unspecified, not intractable, without status migrainosus: Secondary | ICD-10-CM | POA: Insufficient documentation

## 2013-12-25 DIAGNOSIS — H00013 Hordeolum externum right eye, unspecified eyelid: Secondary | ICD-10-CM | POA: Diagnosis not present

## 2013-12-25 DIAGNOSIS — Z880 Allergy status to penicillin: Secondary | ICD-10-CM | POA: Diagnosis not present

## 2013-12-25 DIAGNOSIS — L538 Other specified erythematous conditions: Secondary | ICD-10-CM

## 2013-12-25 DIAGNOSIS — E669 Obesity, unspecified: Secondary | ICD-10-CM | POA: Diagnosis not present

## 2013-12-25 DIAGNOSIS — I10 Essential (primary) hypertension: Secondary | ICD-10-CM | POA: Insufficient documentation

## 2013-12-25 DIAGNOSIS — G40909 Epilepsy, unspecified, not intractable, without status epilepticus: Secondary | ICD-10-CM | POA: Diagnosis not present

## 2013-12-25 DIAGNOSIS — Z720 Tobacco use: Secondary | ICD-10-CM | POA: Insufficient documentation

## 2013-12-25 DIAGNOSIS — Z79899 Other long term (current) drug therapy: Secondary | ICD-10-CM | POA: Diagnosis not present

## 2013-12-25 DIAGNOSIS — H578 Other specified disorders of eye and adnexa: Secondary | ICD-10-CM | POA: Diagnosis present

## 2013-12-25 DIAGNOSIS — I2510 Atherosclerotic heart disease of native coronary artery without angina pectoris: Secondary | ICD-10-CM | POA: Diagnosis not present

## 2013-12-25 DIAGNOSIS — J449 Chronic obstructive pulmonary disease, unspecified: Secondary | ICD-10-CM | POA: Diagnosis not present

## 2013-12-25 MED ORDER — CLINDAMYCIN HCL 150 MG PO CAPS: 300.0000 mg | ORAL_CAPSULE | Freq: Three times a day (TID) | ORAL | Status: DC

## 2013-12-25 MED ORDER — ERYTHROMYCIN 5 MG/GM OP OINT: TOPICAL_OINTMENT | OPHTHALMIC | Status: DC

## 2013-12-25 MED ORDER — HYDROCODONE-ACETAMINOPHEN 5-325 MG PO TABS: 1.0000 | ORAL_TABLET | Freq: Four times a day (QID) | ORAL | Status: DC | PRN

## 2013-12-25 NOTE — ED Provider Notes (Signed)
CSN: [id number]     Arrival date & time 12/25/13  1047 History  This chart was scribed for non-physician practitioner, Dalia Heading, PA-C working with Merryl Hacker, MD by Ladene Artist, ED Scribe. This patient was seen in room TR04C/TR04C and the patient's care was started at 10:59 AM.   Chief Complaint  Patient presents with  . Eye Problem   The history is provided by the patient. No language interpreter was used.   HPI Comments: Carol Hughes is a 48 y.o. female, with a h/o HTN, DM, who presents to the Emergency Department complaining of gradually worsening R eye redness over the past few days. She reports associated eye pain, swelling and a sticky discharge first noted last night. Pt suspects a stye. Pt reports cataract to R eye. No ophthalmologist. Allergy to PCN.  Past Medical History  Diagnosis Date  . Hypertension   . Seizures   . COPD (chronic obstructive pulmonary disease)   . Diabetes mellitus without complication   . Sleep apnea   . Coronary artery disease   . Schizophrenia   . Migraine   . Addiction to drug   . Cancer throat  . Throat cancer   . Obesity    Past Surgical History  Procedure Laterality Date  . Abdominal hysterectomy    . Appendectomy    . Cholecystectomy    . Eye surgery    . Cesarean section    . Knee surgery     Family History  Problem Relation Age of Onset  . Cancer Father    History  Substance Use Topics  . Smoking status: Current Every Day Smoker -- 0.50 packs/day for 30 years    Types: Cigarettes  . Smokeless tobacco: Never Used  . Alcohol Use: No   OB History   Grav Para Term Preterm Abortions TAB SAB Ect Mult Living                 Review of Systems  Eyes: Positive for pain, discharge and redness.  All other systems reviewed and are negative.  Allergies  Aspirin; Bee venom; Other; Toradol; Penicillins; and Vancomycin  Home Medications   Prior to Admission medications   Medication Sig Start Date End Date  Taking? Authorizing Provider  cyclobenzaprine (FLEXERIL) 10 MG tablet Take 1 tablet (10 mg total) by mouth 3 (three) times daily as needed for muscle spasms. 12/15/13   Clayton Bibles, PA-C  divalproex (DEPAKOTE ER) 500 MG 24 hr tablet Take 500 mg by mouth 2 (two) times daily.     Historical Provider, MD  divalproex (DEPAKOTE ER) 500 MG 24 hr tablet Take 2 tablets (1,000 mg total) by mouth daily. 10/12/13   Elyn Peers, MD  HYDROcodone-acetaminophen (NORCO/VICODIN) 5-325 MG per tablet Take 1 tablet by mouth every 6 (six) hours as needed. 11/07/13   Norman Herrlich, NP  HYDROcodone-acetaminophen (NORCO/VICODIN) 5-325 MG per tablet Take 2 tablets by mouth every 4 (four) hours as needed. 12/15/13   Clayton Bibles, PA-C  Lurasidone HCl (LATUDA) 20 MG TABS Take 20 mg by mouth daily.     Historical Provider, MD  metFORMIN (GLUCOPHAGE) 500 MG tablet Take 500 mg by mouth daily with breakfast.    Historical Provider, MD  naproxen (NAPROSYN) 500 MG tablet Take 1 tablet (500 mg total) by mouth 2 (two) times daily. 11/07/13   Norman Herrlich, NP  nitroGLYCERIN (NITROSTAT) 0.4 MG SL tablet Place 0.4 mg under the tongue every 5 (five) minutes as needed for chest  pain.    Historical Provider, MD  phenazopyridine (PYRIDIUM) 200 MG tablet Take 1 tablet (200 mg total) by mouth 3 (three) times daily. 09/28/13   Hoy Morn, MD  predniSONE (DELTASONE) 10 MG tablet Take 2 tablets (20 mg total) by mouth daily. 11/07/13   Norman Herrlich, NP   Triage Vitals: BP 143/83  Pulse 88  Temp(Src) 98.3 F (36.8 C)  Resp 18  SpO2 100% Physical Exam  Nursing note and vitals reviewed. Constitutional: She is oriented to person, place, and time. She appears well-developed and well-nourished.  HENT:  Head: Normocephalic and atraumatic.  Eyes:  Swelling under both eyelids Redness noted Stye on R lower eyelid  Pulmonary/Chest: Effort normal.  Musculoskeletal: Normal range of motion.  Neurological: She is alert and oriented to person,  place, and time.  Skin: Skin is warm and dry.  Psychiatric: She has a normal mood and affect. Her behavior is normal.   ED Course  Procedures (including critical care time) DIAGNOSTIC STUDIES: Oxygen Saturation is 100% on RA, normal by my interpretation.    COORDINATION OF CARE: 11:01 AM-Discussed treatment plan which includes antibiotics and referral with pt at bedside and pt agreed to plan.   Will refer to opthalmology. Use warm compresses on your eye  I personally performed the services described in this documentation, which was scribed in my presence. The recorded information has been reviewed and is accurate.    Brent General, PA-C 12/25/13 1133

## 2013-12-25 NOTE — Discharge Instructions (Signed)
Return here as needed for any worsening in your condition. Follow up with the eye doctor provided. Use warm compresses on the area.

## 2013-12-25 NOTE — ED Provider Notes (Signed)
Medical screening examination/treatment/procedure(s) were conducted as a shared visit with non-physician practitioner(s) and myself.  I personally evaluated the patient during the encounter.   EKG Interpretation None      Patient presents with right eye redness. History of size. Denies any fevers or systemic symptoms. Noted some drainage from the right eye. On exam, patient has a stye on the medial aspect of the lower lip, there is mild erythema and swelling extending inferiorly and laterally. Extraocular movements are intact. This time low suspicion for orbital or periorbital cellulitis; however, extent of erythema is more than would be expected for a simple stye. Given this, we'll place on antibiotics.  Patient given followup.  Merryl Hacker, MD 12/26/13 [phone]

## 2013-12-25 NOTE — ED Notes (Signed)
Pt having redness and swelling to both eyes. Sts stye.

## 2013-12-30 ENCOUNTER — Encounter (HOSPITAL_COMMUNITY): Payer: Self-pay | Admitting: Emergency Medicine

## 2013-12-30 ENCOUNTER — Emergency Department (HOSPITAL_COMMUNITY)
Admission: EM | Admit: 2013-12-30 | Discharge: 2013-12-31 | Disposition: A | Discharge status: Home / Self Care | Payer: Medicaid Other | Attending: Emergency Medicine | Admitting: Emergency Medicine

## 2013-12-30 DIAGNOSIS — R569 Unspecified convulsions: Secondary | ICD-10-CM

## 2013-12-30 DIAGNOSIS — J449 Chronic obstructive pulmonary disease, unspecified: Secondary | ICD-10-CM | POA: Insufficient documentation

## 2013-12-30 DIAGNOSIS — I2510 Atherosclerotic heart disease of native coronary artery without angina pectoris: Secondary | ICD-10-CM | POA: Insufficient documentation

## 2013-12-30 DIAGNOSIS — Z720 Tobacco use: Secondary | ICD-10-CM | POA: Diagnosis not present

## 2013-12-30 DIAGNOSIS — Z880 Allergy status to penicillin: Secondary | ICD-10-CM | POA: Insufficient documentation

## 2013-12-30 DIAGNOSIS — F29 Unspecified psychosis not due to a substance or known physiological condition: Secondary | ICD-10-CM | POA: Insufficient documentation

## 2013-12-30 DIAGNOSIS — Z79899 Other long term (current) drug therapy: Secondary | ICD-10-CM | POA: Diagnosis not present

## 2013-12-30 DIAGNOSIS — G40909 Epilepsy, unspecified, not intractable, without status epilepticus: Secondary | ICD-10-CM | POA: Insufficient documentation

## 2013-12-30 DIAGNOSIS — Z85819 Personal history of malignant neoplasm of unspecified site of lip, oral cavity, and pharynx: Secondary | ICD-10-CM | POA: Diagnosis not present

## 2013-12-30 DIAGNOSIS — Z792 Long term (current) use of antibiotics: Secondary | ICD-10-CM | POA: Insufficient documentation

## 2013-12-30 DIAGNOSIS — F209 Schizophrenia, unspecified: Secondary | ICD-10-CM | POA: Diagnosis not present

## 2013-12-30 DIAGNOSIS — I10 Essential (primary) hypertension: Secondary | ICD-10-CM | POA: Diagnosis not present

## 2013-12-30 DIAGNOSIS — E119 Type 2 diabetes mellitus without complications: Secondary | ICD-10-CM | POA: Diagnosis not present

## 2013-12-30 MED ORDER — ACETAMINOPHEN 325 MG PO TABS
650.0000 mg | ORAL_TABLET | Freq: Once | ORAL | Status: AC
  Administered 2013-12-30: 650 mg via ORAL
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
## 2013-12-30 NOTE — ED Notes (Signed)
EMS called to bus stop.  Found patient sitting on bench and chewing on her tongue.  Family  States that patient possibly had a seizure.  Patient has a history of seizure.  Patient presents  Post ictal on arrival to ED.

## 2013-12-31 LAB — URINALYSIS, ROUTINE W REFLEX MICROSCOPIC
Bilirubin Urine: NEGATIVE
Glucose, UA: NEGATIVE mg/dL
Ketones, ur: NEGATIVE mg/dL
Nitrite: NEGATIVE
PROTEIN: NEGATIVE mg/dL
Specific Gravity, Urine: 1.017 (ref 1.005–1.030)
UROBILINOGEN UA: 0.2 mg/dL (ref 0.0–1.0)
pH: 6 (ref 5.0–8.0)

## 2013-12-31 LAB — BASIC METABOLIC PANEL
ANION GAP: 14 (ref 5–15)
BUN: 12 mg/dL (ref 6–23)
CALCIUM: 9.3 mg/dL (ref 8.4–10.5)
CO2: 24 mEq/L (ref 19–32)
Chloride: 99 mEq/L (ref 96–112)
Creatinine, Ser: 0.69 mg/dL (ref 0.50–1.10)
Glucose, Bld: 129 mg/dL — ABNORMAL HIGH (ref 70–99)
POTASSIUM: 3.6 meq/L — AB (ref 3.7–5.3)
SODIUM: 137 meq/L (ref 137–147)

## 2013-12-31 LAB — CBC WITH DIFFERENTIAL/PLATELET
BASOS ABS: 0 10*3/uL (ref 0.0–0.1)
BASOS PCT: 0 % (ref 0–1)
EOS ABS: 0.1 10*3/uL (ref 0.0–0.7)
EOS PCT: 1 % (ref 0–5)
HCT: 38.7 % (ref 36.0–46.0)
Hemoglobin: 13.4 g/dL (ref 12.0–15.0)
Lymphocytes Relative: 34 % (ref 12–46)
Lymphs Abs: 3.8 10*3/uL (ref 0.7–4.0)
MCH: 31.2 pg (ref 26.0–34.0)
MCHC: 34.6 g/dL (ref 30.0–36.0)
MCV: 90 fL (ref 78.0–100.0)
Monocytes Absolute: 0.8 10*3/uL (ref 0.1–1.0)
Monocytes Relative: 7 % (ref 3–12)
NEUTROS PCT: 58 % (ref 43–77)
Neutro Abs: 6.6 10*3/uL (ref 1.7–7.7)
PLATELETS: 199 10*3/uL (ref 150–400)
RBC: 4.3 MIL/uL (ref 3.87–5.11)
RDW: 12.5 % (ref 11.5–15.5)
WBC: 11.3 10*3/uL — ABNORMAL HIGH (ref 4.0–10.5)

## 2013-12-31 LAB — URINE MICROSCOPIC-ADD ON

## 2013-12-31 LAB — VALPROIC ACID LEVEL

## 2013-12-31 MED ORDER — VALPROATE SODIUM 500 MG/5ML IV SOLN
500.0000 mg | Freq: Once | INTRAVENOUS | Status: AC
  Administered 2013-12-31: 500 mg via INTRAVENOUS
  Filled 2013-12-31: qty 5

## 2013-12-31 NOTE — Discharge Instructions (Signed)

## 2013-12-31 NOTE — ED Notes (Signed)
Patient is alert and oriented x3.  She was given DC instructions and follow up visit instructions.  Patient gave verbal understanding. She was DC ambulatory under her own power to home.  V/S stable.  He was not showing any signs of distress on DC 

## 2014-01-15 ENCOUNTER — Emergency Department (HOSPITAL_COMMUNITY): Payer: Medicaid Other

## 2014-01-15 ENCOUNTER — Emergency Department (HOSPITAL_COMMUNITY)
Admission: EM | Admit: 2014-01-15 | Discharge: 2014-01-15 | Disposition: A | Discharge status: Home / Self Care | Payer: Medicaid Other | Attending: Emergency Medicine | Admitting: Emergency Medicine

## 2014-01-15 ENCOUNTER — Encounter (HOSPITAL_COMMUNITY): Payer: Self-pay | Admitting: Emergency Medicine

## 2014-01-15 DIAGNOSIS — E669 Obesity, unspecified: Secondary | ICD-10-CM | POA: Insufficient documentation

## 2014-01-15 DIAGNOSIS — F209 Schizophrenia, unspecified: Secondary | ICD-10-CM | POA: Diagnosis not present

## 2014-01-15 DIAGNOSIS — W19XXXA Unspecified fall, initial encounter: Secondary | ICD-10-CM

## 2014-01-15 DIAGNOSIS — S300XXA Contusion of lower back and pelvis, initial encounter: Secondary | ICD-10-CM | POA: Insufficient documentation

## 2014-01-15 DIAGNOSIS — I2510 Atherosclerotic heart disease of native coronary artery without angina pectoris: Secondary | ICD-10-CM | POA: Diagnosis not present

## 2014-01-15 DIAGNOSIS — G43909 Migraine, unspecified, not intractable, without status migrainosus: Secondary | ICD-10-CM | POA: Diagnosis not present

## 2014-01-15 DIAGNOSIS — E119 Type 2 diabetes mellitus without complications: Secondary | ICD-10-CM | POA: Diagnosis not present

## 2014-01-15 DIAGNOSIS — Y9389 Activity, other specified: Secondary | ICD-10-CM | POA: Diagnosis not present

## 2014-01-15 DIAGNOSIS — Z720 Tobacco use: Secondary | ICD-10-CM | POA: Insufficient documentation

## 2014-01-15 DIAGNOSIS — I10 Essential (primary) hypertension: Secondary | ICD-10-CM | POA: Diagnosis not present

## 2014-01-15 DIAGNOSIS — Z79899 Other long term (current) drug therapy: Secondary | ICD-10-CM | POA: Diagnosis not present

## 2014-01-15 DIAGNOSIS — S8992XA Unspecified injury of left lower leg, initial encounter: Secondary | ICD-10-CM | POA: Diagnosis not present

## 2014-01-15 DIAGNOSIS — S3992XA Unspecified injury of lower back, initial encounter: Secondary | ICD-10-CM | POA: Insufficient documentation

## 2014-01-15 DIAGNOSIS — Y9289 Other specified places as the place of occurrence of the external cause: Secondary | ICD-10-CM | POA: Insufficient documentation

## 2014-01-15 DIAGNOSIS — Z85818 Personal history of malignant neoplasm of other sites of lip, oral cavity, and pharynx: Secondary | ICD-10-CM | POA: Diagnosis not present

## 2014-01-15 DIAGNOSIS — S20222A Contusion of left back wall of thorax, initial encounter: Secondary | ICD-10-CM

## 2014-01-15 DIAGNOSIS — Z880 Allergy status to penicillin: Secondary | ICD-10-CM | POA: Diagnosis not present

## 2014-01-15 DIAGNOSIS — M25569 Pain in unspecified knee: Secondary | ICD-10-CM

## 2014-01-15 DIAGNOSIS — W01198A Fall on same level from slipping, tripping and stumbling with subsequent striking against other object, initial encounter: Secondary | ICD-10-CM | POA: Insufficient documentation

## 2014-01-15 DIAGNOSIS — M549 Dorsalgia, unspecified: Secondary | ICD-10-CM

## 2014-01-15 DIAGNOSIS — J449 Chronic obstructive pulmonary disease, unspecified: Secondary | ICD-10-CM | POA: Insufficient documentation

## 2014-01-15 MED ORDER — CYCLOBENZAPRINE HCL 10 MG PO TABS
10.0000 mg | ORAL_TABLET | Freq: Three times a day (TID) | ORAL | Status: DC | PRN
Start: 2014-01-15 — End: 2014-12-09

## 2014-01-15 MED ORDER — HYDROCODONE-ACETAMINOPHEN 5-325 MG PO TABS
2.0000 | ORAL_TABLET | Freq: Once | ORAL | Status: AC
  Administered 2014-01-15: 2 via ORAL
  Filled 2014-01-15: qty 2

## 2014-01-15 NOTE — ED Provider Notes (Signed)
Medical screening examination/treatment/procedure(s) were performed by non-physician practitioner and as supervising physician I was immediately available for consultation/collaboration.   EKG Interpretation None       Orlie Dakin, MD 01/15/14 1727

## 2014-01-15 NOTE — ED Provider Notes (Signed)
Pt signed out from [facility] PA-C at shift change.  Patient informed that radiology has a delay. Patient demanding to leave now. The results of her scans have not come back. Patient encouraged to wait for the results of scans by both myself and the nurses on multiple occasions. Patient left the ED. Patient given crutches prior to discharge. Patient informed that she may have fractures and needs to follow-up with primary care provider.   Discussed return precautions with patient. Discussed all results and patient verbalizes understanding and agrees with plan.  1. Back pain   2. Knee pain   3. Fall, initial encounter   4. Contusion, back, left, initial encounter      Pura Spice, PA-C 01/16/14 [phone]

## 2014-01-15 NOTE — ED Notes (Signed)
Pt did not want vitals done at D/C .

## 2014-01-15 NOTE — ED Notes (Signed)
Pt requesting food, "I'm diabetic and I'm starting to feel sick". Given Happy Meal.

## 2014-01-15 NOTE — ED Provider Notes (Signed)
CSN: [id number]     Arrival date & time 01/15/14  1151 History  This chart was scribed for non-physician practitioner Clayton Bibles, PA-C working with Orlie Dakin, MD by Ludger Nutting, ED Scribe. This patient was seen in room TR05C/TR05C and the patient's care was started at 3:21 PM.      Chief Complaint  Patient presents with  . Back Pain  . Knee Pain   The history is provided by the patient. No language interpreter was used.    HPI Comments: Carol Hughes is a 48 y.o. female who presents to the Emergency Department complaining of 4 days of gradual onset, constant, gradually worsened back pain and left knee pain. 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 She states she struck her back on a CD stand, causing her to have bruising. She states the bruising is improving but she continues to have left back pain and left knee pain. She has tried OTC pain medication without relief. She denies abdominal pain, dysuria, fever, bowel/bladder incontinence.  Denies weakness or numbness of the extremities.    Past Medical History  Diagnosis Date  . Hypertension   . Seizures   . COPD (chronic obstructive pulmonary disease)   . Diabetes mellitus without complication   . Sleep apnea   . Coronary artery disease   . Schizophrenia   . Migraine   . Addiction to drug   . Obesity   . Cancer throat  . Throat cancer    Past Surgical History  Procedure Laterality Date  . Abdominal hysterectomy    . Appendectomy    . Cholecystectomy    . Eye surgery    . Cesarean section    . Knee surgery     Family History  Problem Relation Age of Onset  . Cancer Father    History  Substance Use Topics  . Smoking status: Current Every Day Smoker -- 0.50 packs/day for 30 years    Types: Cigarettes  . Smokeless tobacco: Never Used  . Alcohol Use: No   OB History   Grav Para Term Preterm Abortions TAB SAB Ect Mult Living                  Review of Systems  Musculoskeletal: Positive for arthralgias and back pain. Negative for neck pain.  Neurological: Negative for weakness and numbness.      Allergies  Aspirin; Bee venom; Other; Toradol; Penicillins; and Vancomycin  Home Medications   Prior to Admission medications   Medication Sig Start Date End Date Taking? Authorizing Provider  acetaminophen (TYLENOL) 500 MG tablet Take 500-1,000 mg by mouth every 4 (four) hours as needed for moderate pain.   Yes Historical Provider, MD  cyclobenzaprine (FLEXERIL) 10 MG tablet Take 1 tablet (10 mg total) by mouth 3 (three) times daily as needed for muscle spasms. 12/15/13  Yes Clayton Bibles, PA-C  divalproex (DEPAKOTE ER) 500 MG 24 hr tablet Take 2 tablets (1,000 mg total) by mouth daily. 10/12/13  Yes Elyn Peers, MD  Lurasidone HCl (LATUDA) 20 MG TABS Take 20 mg by mouth daily.    Yes Historical Provider, MD  metFORMIN (GLUCOPHAGE) 500 MG tablet Take 500 mg by mouth daily with breakfast.   Yes Historical Provider, MD  nitroGLYCERIN (NITROSTAT) 0.4 MG SL tablet Place 0.4 mg under the tongue every 5 (five) minutes as needed for chest pain.   Yes Historical Provider, MD   BP 145/90  Pulse 83  Temp(Src) 98.1 F (36.7 C) (Oral)  Resp 18  Ht 5\' 2"  (1.575 m)  Wt 231 lb (104.781 kg)  BMI 42.24 kg/m2  SpO2 98% Physical Exam  Nursing note and vitals reviewed. Constitutional: She appears well-developed and well-nourished. No distress.  HENT:  Head: Normocephalic and atraumatic.  Neck: Neck supple.  Pulmonary/Chest: Effort normal.  Musculoskeletal:  Tenderness to palpation throughout left lower and mid back. Tenderness over thoracic and upper lumbar spine. No crepitus or deformities.  Left knee tenderness posteriorly. Patient unable to fully extend the left knee. Distal pulses and sensation intact. No noted edema. Patient able to ambulate with limping gait.   Neurological: She is alert.  Skin: She is not diaphoretic.  Healing  ecchymosis over the left mid back.     ED Course  Procedures (including critical care time)  DIAGNOSTIC STUDIES: Oxygen Saturation is 98% on RA, normal by my interpretation.    COORDINATION OF CARE: 3:29 PM Discussed treatment plan with pt at bedside and pt agreed to plan.   Labs Review Labs Reviewed - No data to display  Imaging Review No results found.   EKG Interpretation None      MDM   Final diagnoses:  Back pain  Knee pain  Fall, initial encounter  Traumatic ecchymosis of lower back, initial encounter    Afebrile, nontoxic patient with fall 4 days ago with healing ecchymosis of left back and c/o mid and low back and left knee pain.  Neurovascularly intact.  No red flags.  Xrays pending at change of shift.  Anticipate  D/C home with pain medication and PCP follow up.  Discussed result, findings, treatment, and follow up  with patient.  Pt given return precautions.  Pt verbalizes understanding and agrees with plan.     5:04 PM Signed out to Doyce Loose, PA-C, at change of shift pending xrays.    I personally performed the services described in this documentation, which was scribed in my presence. The recorded information has been reviewed and is accurate.   Clayton Bibles, PA-C 01/15/14 1705
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
## 2014-01-15 NOTE — Discharge Instructions (Signed)
Read the information below.  Use the prescribed medication as directed.  Please discuss all new medications with your pharmacist.  You may return to the Emergency Department at any time for worsening condition or any new symptoms that concern you.   If you develop fevers, loss of control of bowel or bladder, weakness or numbness in your legs, or are unable to walk, return to the ER for a recheck. If you develop uncontrolled pain, weakness or numbness of the extremity, severe discoloration of the skin, or you are unable to bend your knee or walk, return to the ER for a recheck.

## 2014-01-15 NOTE — ED Notes (Signed)
Declined W/C at D/C and was escorted to lobby by RN. 

## 2014-01-16 NOTE — ED Provider Notes (Signed)
Medical screening examination/treatment/procedure(s) were performed by non-physician practitioner and as supervising physician I was immediately available for consultation/collaboration.   EKG Interpretation None       Charlesetta Shanks, MD 01/16/14 [phone]

## 2014-05-11 ENCOUNTER — Other Ambulatory Visit: Payer: Self-pay | Admitting: Internal Medicine

## 2014-05-22 ENCOUNTER — Other Ambulatory Visit: Payer: Self-pay | Admitting: Internal Medicine

## 2014-05-22 ENCOUNTER — Ambulatory Visit
Admission: RE | Admit: 2014-05-22 | Discharge: 2014-05-22 | Disposition: A | Discharge status: Home / Self Care | Payer: Medicaid Other | Source: Ambulatory Visit | Attending: Internal Medicine | Admitting: Internal Medicine

## 2014-05-22 DIAGNOSIS — N611 Abscess of the breast and nipple: Secondary | ICD-10-CM

## 2014-08-03 ENCOUNTER — Emergency Department (HOSPITAL_COMMUNITY)
Admission: EM | Admit: 2014-08-03 | Discharge: 2014-08-04 | Disposition: A | Discharge status: Home / Self Care | Payer: Medicaid Other | Attending: Emergency Medicine | Admitting: Emergency Medicine

## 2014-08-03 ENCOUNTER — Encounter (HOSPITAL_COMMUNITY): Payer: Self-pay | Admitting: *Deleted

## 2014-08-03 ENCOUNTER — Emergency Department (HOSPITAL_COMMUNITY): Payer: Medicaid Other

## 2014-08-03 DIAGNOSIS — G40909 Epilepsy, unspecified, not intractable, without status epilepticus: Secondary | ICD-10-CM | POA: Diagnosis not present

## 2014-08-03 DIAGNOSIS — E669 Obesity, unspecified: Secondary | ICD-10-CM | POA: Insufficient documentation

## 2014-08-03 DIAGNOSIS — Z79899 Other long term (current) drug therapy: Secondary | ICD-10-CM | POA: Diagnosis not present

## 2014-08-03 DIAGNOSIS — R569 Unspecified convulsions: Secondary | ICD-10-CM | POA: Diagnosis present

## 2014-08-03 DIAGNOSIS — G43909 Migraine, unspecified, not intractable, without status migrainosus: Secondary | ICD-10-CM | POA: Diagnosis not present

## 2014-08-03 DIAGNOSIS — E119 Type 2 diabetes mellitus without complications: Secondary | ICD-10-CM | POA: Insufficient documentation

## 2014-08-03 DIAGNOSIS — I2510 Atherosclerotic heart disease of native coronary artery without angina pectoris: Secondary | ICD-10-CM | POA: Insufficient documentation

## 2014-08-03 DIAGNOSIS — Z85818 Personal history of malignant neoplasm of other sites of lip, oral cavity, and pharynx: Secondary | ICD-10-CM | POA: Diagnosis not present

## 2014-08-03 DIAGNOSIS — R51 Headache: Secondary | ICD-10-CM

## 2014-08-03 DIAGNOSIS — Z8659 Personal history of other mental and behavioral disorders: Secondary | ICD-10-CM | POA: Diagnosis not present

## 2014-08-03 DIAGNOSIS — I10 Essential (primary) hypertension: Secondary | ICD-10-CM | POA: Insufficient documentation

## 2014-08-03 DIAGNOSIS — Z880 Allergy status to penicillin: Secondary | ICD-10-CM | POA: Diagnosis not present

## 2014-08-03 DIAGNOSIS — Z720 Tobacco use: Secondary | ICD-10-CM | POA: Insufficient documentation

## 2014-08-03 DIAGNOSIS — J449 Chronic obstructive pulmonary disease, unspecified: Secondary | ICD-10-CM | POA: Diagnosis not present

## 2014-08-03 DIAGNOSIS — R519 Headache, unspecified: Secondary | ICD-10-CM

## 2014-08-03 LAB — BASIC METABOLIC PANEL
Anion gap: 10 (ref 5–15)
BUN: 18 mg/dL (ref 6–20)
CO2: 28 mmol/L (ref 22–32)
Calcium: 9.5 mg/dL (ref 8.9–10.3)
Chloride: 95 mmol/L — ABNORMAL LOW (ref 101–111)
Creatinine, Ser: 0.88 mg/dL (ref 0.44–1.00)
GFR calc non Af Amer: >60 mL/min (ref >60)
GLUCOSE: 297 mg/dL — AB (ref 65–99)
Potassium: 4.2 mmol/L (ref 3.5–5.1)
SODIUM: 133 mmol/L — AB (ref 135–145)

## 2014-08-03 LAB — CBC
HCT: 43.3 % (ref 36.0–46.0)
HEMOGLOBIN: 14.2 g/dL (ref 12.0–15.0)
MCH: 30.3 pg (ref 26.0–34.0)
MCHC: 32.8 g/dL (ref 30.0–36.0)
MCV: 92.5 fL (ref 78.0–100.0)
Platelets: 154 10*3/uL (ref 150–400)
RBC: 4.68 MIL/uL (ref 3.87–5.11)
RDW: 12.9 % (ref 11.5–15.5)
WBC: 8.6 10*3/uL (ref 4.0–10.5)

## 2014-08-03 MED ORDER — SODIUM CHLORIDE 0.9 % IV BOLUS (SEPSIS)
1000.0000 mL | Freq: Once | INTRAVENOUS | Status: AC
  Administered 2014-08-03: 1000 mL via INTRAVENOUS

## 2014-08-03 MED ORDER — DIPHENHYDRAMINE HCL 50 MG/ML IJ SOLN
25.0000 mg | Freq: Once | INTRAMUSCULAR | Status: AC
  Administered 2014-08-03: 25 mg via INTRAVENOUS
  Filled 2014-08-03: qty 1

## 2014-08-03 MED ORDER — ACETAMINOPHEN 500 MG PO TABS
1000.0000 mg | ORAL_TABLET | Freq: Once | ORAL | Status: AC
  Administered 2014-08-03: 1000 mg via ORAL
  Filled 2014-08-03: qty 2

## 2014-08-03 MED ORDER — METOCLOPRAMIDE HCL 5 MG/ML IJ SOLN
10.0000 mg | Freq: Once | INTRAMUSCULAR | Status: AC
  Administered 2014-08-03: 10 mg via INTRAVENOUS
  Filled 2014-08-03: qty 2

## 2014-08-03 NOTE — ED Provider Notes (Signed)
CSN: [id number]     Arrival date & time 08/03/14  2031 History   First MD Initiated Contact with Patient 08/03/14 2036     Chief Complaint  Patient presents with  . Seizures     (Consider location/radiation/quality/duration/timing/severity/associated sxs/prior Treatment) Patient is a 49 y.o. female presenting with seizures. The history is provided by the patient.  Seizures Seizure activity on arrival: no   Seizure type:  Grand mal (grand mal, however boyfriend reports a lot of combativeness) Initial focality:  None Episode characteristics: combativeness   Return to baseline: yes   Severity:  Moderate Timing:  Clustered Number of seizures this episode:  2 Context: medical non-compliance     Past Medical History  Diagnosis Date  . Hypertension   . Seizures   . COPD (chronic obstructive pulmonary disease)   . Diabetes mellitus without complication   . Sleep apnea   . Coronary artery disease   . Schizophrenia   . Migraine   . Addiction to drug   . Obesity   . Cancer throat  . Throat cancer    Past Surgical History  Procedure Laterality Date  . Abdominal hysterectomy    . Appendectomy    . Cholecystectomy    . Eye surgery    . Cesarean section    . Knee surgery     Family History  Problem Relation Age of Onset  . Cancer Father    History  Substance Use Topics  . Smoking status: Current Every Day Smoker -- 0.50 packs/day for 30 years    Types: Cigarettes  . Smokeless tobacco: Never Used  . Alcohol Use: No   OB History    No data available     Review of Systems  Constitutional: Negative for fever and chills.  Respiratory: Negative for shortness of breath.   Cardiovascular: Negative for chest pain.  Gastrointestinal: Negative for nausea, vomiting, abdominal pain and diarrhea.  Neurological: Positive for seizures.  All other systems reviewed and are negative.     Allergies  Aspirin; Bee venom; Other; Toradol; Penicillins; and Vancomycin  Home  Medications   Prior to Admission medications   Medication Sig Start Date End Date Taking? Authorizing Provider  albuterol (PROVENTIL HFA;VENTOLIN HFA) 108 (90 BASE) MCG/ACT inhaler Inhale 2 puffs into the lungs every 6 (six) hours as needed. Shortness of breath 10/27/10  Yes Historical Provider, MD  chlorhexidine (PERIDEX) 0.12 % solution 15 mLs 2 (two) times daily.   Yes Historical Provider, MD  citalopram (CELEXA) 40 MG tablet Take 40 mg by mouth at bedtime.   Yes Historical Provider, MD  divalproex (DEPAKOTE ER) 500 MG 24 hr tablet Take 2 tablets (1,000 mg total) by mouth daily. Patient taking differently: Take 500 mg by mouth 2 (two) times daily.  10/12/13  Yes Elyn Peers, MD  gabapentin (NEURONTIN) 300 MG capsule Take 300 mg by mouth 2 (two) times daily. At lunch and bedtime   Yes Historical Provider, MD  lisinopril-hydrochlorothiazide (PRINZIDE,ZESTORETIC) 20-25 MG per tablet Take 1 tablet by mouth every morning.   Yes Historical Provider, MD  LORazepam (ATIVAN) 1 MG tablet Take 0.5-1 mg by mouth every 8 (eight) hours as needed. For anxiety.   Yes Historical Provider, MD  metFORMIN (GLUCOPHAGE) 500 MG tablet Take 500 mg by mouth 2 (two) times daily with a meal.    Yes Historical Provider, MD  nitroGLYCERIN (NITROSTAT) 0.4 MG SL tablet Place 0.4 mg under the tongue every 5 (five) minutes as needed for chest pain.  Yes Historical Provider, MD  pravastatin (PRAVACHOL) 20 MG tablet Take 20 mg by mouth daily.   Yes Historical Provider, MD  cyclobenzaprine (FLEXERIL) 10 MG tablet Take 1 tablet (10 mg total) by mouth 3 (three) times daily as needed for muscle spasms. Patient not taking: Reported on 08/03/2014 12/15/13   Clayton Bibles, PA-C  cyclobenzaprine (FLEXERIL) 10 MG tablet Take 1 tablet (10 mg total) by mouth 3 (three) times daily as needed for muscle spasms (or pain). Patient not taking: Reported on 08/03/2014 01/15/14   Clayton Bibles, PA-C   BP 122/63 mmHg  Pulse 72  Temp(Src) 98.1 F (36.7 C)  (Oral)  Resp 19  SpO2 95% Physical Exam  Constitutional: She is oriented to person, place, and time. She appears well-developed and well-nourished. No distress.  HENT:  Head: Normocephalic and atraumatic.  Mouth/Throat: Oropharynx is clear and moist.  Eyes: EOM are normal. Pupils are equal, round, and reactive to light.  Neck: Normal range of motion. Neck supple.  Cardiovascular: Normal rate and regular rhythm.  Exam reveals no friction rub.   No murmur heard. Pulmonary/Chest: Effort normal and breath sounds normal. No respiratory distress. She has no wheezes. She has no rales.  Abdominal: Soft. She exhibits no distension. There is no tenderness. There is no rebound.  Musculoskeletal: Normal range of motion. She exhibits no edema.  Neurological: She is alert and oriented to person, place, and time.  Skin: No rash noted. She is not diaphoretic.  Nursing note and vitals reviewed.   ED Course  Procedures (including critical care time) Labs Review Labs Reviewed  BASIC METABOLIC PANEL - Abnormal; Notable for the following:    Sodium 133 (*)    Chloride 95 (*)    Glucose, Bld 297 (*)    All other components within normal limits  CBC  VALPROIC ACID LEVEL    Imaging Review Ct Head Wo Contrast  08/03/2014   CLINICAL DATA:  Seizure 3 days ago.  EXAM: CT HEAD WITHOUT CONTRAST  TECHNIQUE: Contiguous axial images were obtained from the base of the skull through the vertex without intravenous contrast.  COMPARISON:  February 22, 2014  FINDINGS: There is no midline shift, hydrocephalus, or mass. No acute hemorrhage or acute transcortical infarct is identified. The bony calvarium is intact. The visualized sinuses are clear.  IMPRESSION: No focal acute intracranial abnormality identified. No focal hemorrhage or acute transcortical infarct.   Electronically Signed   By: Abelardo Diesel M.D.   On: 08/03/2014 22:37     EKG Interpretation None      MDM   Final diagnoses:  Seizure    51F here  with seizures. Hx of seizures, on depakote. Hasn't missed her dose in a few days. She had 2 seizures today, no fevers, vomiting, or diarrhea. Last seizure before tonight 3 days ok.  I suspect her seizures are secondary to noncompliance. She's reporting a severe headache, so CT head done. Boyfriend reports that during her seizure she was very combative, which was worse for her. CT Head normal. Labs ok. Will await depakote level.  Depakote therapeutic. Stable for discharge.   Evelina Bucy, MD 08/04/14 [phone]

## 2014-08-03 NOTE — ED Notes (Signed)
Pt states she takes tylenol everyday without relief that is why she refused it.

## 2014-08-03 NOTE — ED Notes (Signed)
Pt refused Tylenol after I had checked in computer taken,

## 2014-08-03 NOTE — ED Notes (Signed)
Pt arrived via EMS from church gathering,  She is homeless and has mental disability,  Boyfriend at bedside states she had a seizure 3 days ago and she is taking her depakote as prescribed,  Seizures times two lasting 2 to 3 minutes,  5 minutes apart,  Only complains of HA and confused a little per norm.  Pt states she had also just ate and is diabetic

## 2014-08-03 NOTE — ED Notes (Signed)
Bed: WA01 Expected date:  Expected time:  Means of arrival:  Comments: 40F Seizure

## 2014-08-04 LAB — VALPROIC ACID LEVEL: Valproic Acid Lvl: 59 ug/mL (ref 50.0–100.0)

## 2014-08-04 NOTE — Discharge Instructions (Signed)

## 2014-08-27 ENCOUNTER — Encounter (HOSPITAL_COMMUNITY): Payer: Self-pay | Admitting: Emergency Medicine

## 2014-08-27 ENCOUNTER — Emergency Department (HOSPITAL_COMMUNITY): Payer: Medicaid Other

## 2014-08-27 ENCOUNTER — Emergency Department (HOSPITAL_COMMUNITY)
Admission: EM | Admit: 2014-08-27 | Discharge: 2014-08-27 | Discharge status: Against Medical Advice | Payer: Medicaid Other | Attending: Emergency Medicine | Admitting: Emergency Medicine

## 2014-08-27 DIAGNOSIS — J449 Chronic obstructive pulmonary disease, unspecified: Secondary | ICD-10-CM | POA: Insufficient documentation

## 2014-08-27 DIAGNOSIS — I10 Essential (primary) hypertension: Secondary | ICD-10-CM | POA: Insufficient documentation

## 2014-08-27 DIAGNOSIS — R21 Rash and other nonspecific skin eruption: Secondary | ICD-10-CM | POA: Insufficient documentation

## 2014-08-27 DIAGNOSIS — N898 Other specified noninflammatory disorders of vagina: Secondary | ICD-10-CM | POA: Diagnosis not present

## 2014-08-27 DIAGNOSIS — R197 Diarrhea, unspecified: Secondary | ICD-10-CM | POA: Insufficient documentation

## 2014-08-27 DIAGNOSIS — E119 Type 2 diabetes mellitus without complications: Secondary | ICD-10-CM | POA: Insufficient documentation

## 2014-08-27 DIAGNOSIS — Z720 Tobacco use: Secondary | ICD-10-CM | POA: Insufficient documentation

## 2014-08-27 DIAGNOSIS — M6281 Muscle weakness (generalized): Secondary | ICD-10-CM | POA: Diagnosis not present

## 2014-08-27 DIAGNOSIS — E669 Obesity, unspecified: Secondary | ICD-10-CM | POA: Insufficient documentation

## 2014-08-27 LAB — CBC WITH DIFFERENTIAL/PLATELET
BASOS ABS: 0 10*3/uL (ref 0.0–0.1)
Basophils Relative: 0 % (ref 0–1)
EOS PCT: 2 % (ref 0–5)
Eosinophils Absolute: 0.2 10*3/uL (ref 0.0–0.7)
HCT: 42.2 % (ref 36.0–46.0)
Hemoglobin: 14 g/dL (ref 12.0–15.0)
LYMPHS ABS: 3.7 10*3/uL (ref 0.7–4.0)
Lymphocytes Relative: 46 % (ref 12–46)
MCH: 30.5 pg (ref 26.0–34.0)
MCHC: 33.2 g/dL (ref 30.0–36.0)
MCV: 91.9 fL (ref 78.0–100.0)
MONO ABS: 0.9 10*3/uL (ref 0.1–1.0)
Monocytes Relative: 11 % (ref 3–12)
NEUTROS PCT: 41 % — AB (ref 43–77)
Neutro Abs: 3.3 10*3/uL (ref 1.7–7.7)
Platelets: 154 10*3/uL (ref 150–400)
RBC: 4.59 MIL/uL (ref 3.87–5.11)
RDW: 13 % (ref 11.5–15.5)
WBC: 8.1 10*3/uL (ref 4.0–10.5)

## 2014-08-27 LAB — COMPREHENSIVE METABOLIC PANEL
ALBUMIN: 3.6 g/dL (ref 3.5–5.0)
ALK PHOS: 96 U/L (ref 38–126)
ALT: 10 U/L — AB (ref 14–54)
AST: 19 U/L (ref 15–41)
Anion gap: 8 (ref 5–15)
BILIRUBIN TOTAL: 0.2 mg/dL — AB (ref 0.3–1.2)
BUN: 17 mg/dL (ref 6–20)
CO2: 28 mmol/L (ref 22–32)
CREATININE: 0.78 mg/dL (ref 0.44–1.00)
Calcium: 9.1 mg/dL (ref 8.9–10.3)
Chloride: 101 mmol/L (ref 101–111)
GFR calc Af Amer: >60 mL/min (ref >60)
GFR calc non Af Amer: >60 mL/min (ref >60)
GLUCOSE: 183 mg/dL — AB (ref 65–99)
Potassium: 4.2 mmol/L (ref 3.5–5.1)
SODIUM: 137 mmol/L (ref 135–145)
Total Protein: 6.6 g/dL (ref 6.5–8.1)

## 2014-08-27 LAB — URINALYSIS, ROUTINE W REFLEX MICROSCOPIC
Glucose, UA: 250 mg/dL — AB
Ketones, ur: 15 mg/dL — AB
Nitrite: NEGATIVE
PROTEIN: NEGATIVE mg/dL
Specific Gravity, Urine: 1.031 — ABNORMAL HIGH (ref 1.005–1.030)
UROBILINOGEN UA: 1 mg/dL (ref 0.0–1.0)
pH: 5.5 (ref 5.0–8.0)

## 2014-08-27 LAB — URINE MICROSCOPIC-ADD ON

## 2014-08-27 NOTE — ED Notes (Signed)
Pt. arrived with EMS from home reports multiple complaints : diarrhea for 1 week , generalized weakness for 2 weeks , vaginal discharge "yeast" for several days , generalized itchy rashes for several days and productive cough onset this week . Denies fever or chills. Respirations unlabored /ambulatory.

## 2014-08-27 NOTE — ED Notes (Signed)
Called for pt for room placement - no answer.

## 2014-08-29 ENCOUNTER — Emergency Department (HOSPITAL_COMMUNITY)
Admission: EM | Admit: 2014-08-29 | Discharge: 2014-08-30 | Disposition: A | Discharge status: Home / Self Care | Payer: Medicaid Other | Attending: Emergency Medicine | Admitting: Emergency Medicine

## 2014-08-29 ENCOUNTER — Encounter (HOSPITAL_COMMUNITY): Payer: Self-pay | Admitting: *Deleted

## 2014-08-29 ENCOUNTER — Emergency Department (HOSPITAL_COMMUNITY): Payer: Medicaid Other

## 2014-08-29 DIAGNOSIS — E119 Type 2 diabetes mellitus without complications: Secondary | ICD-10-CM | POA: Diagnosis not present

## 2014-08-29 DIAGNOSIS — G43909 Migraine, unspecified, not intractable, without status migrainosus: Secondary | ICD-10-CM | POA: Insufficient documentation

## 2014-08-29 DIAGNOSIS — E669 Obesity, unspecified: Secondary | ICD-10-CM | POA: Insufficient documentation

## 2014-08-29 DIAGNOSIS — Z720 Tobacco use: Secondary | ICD-10-CM | POA: Insufficient documentation

## 2014-08-29 DIAGNOSIS — R569 Unspecified convulsions: Secondary | ICD-10-CM

## 2014-08-29 DIAGNOSIS — Z85818 Personal history of malignant neoplasm of other sites of lip, oral cavity, and pharynx: Secondary | ICD-10-CM | POA: Insufficient documentation

## 2014-08-29 DIAGNOSIS — I2510 Atherosclerotic heart disease of native coronary artery without angina pectoris: Secondary | ICD-10-CM | POA: Insufficient documentation

## 2014-08-29 DIAGNOSIS — Z880 Allergy status to penicillin: Secondary | ICD-10-CM | POA: Insufficient documentation

## 2014-08-29 DIAGNOSIS — J449 Chronic obstructive pulmonary disease, unspecified: Secondary | ICD-10-CM | POA: Insufficient documentation

## 2014-08-29 DIAGNOSIS — G40909 Epilepsy, unspecified, not intractable, without status epilepticus: Secondary | ICD-10-CM | POA: Diagnosis not present

## 2014-08-29 DIAGNOSIS — I10 Essential (primary) hypertension: Secondary | ICD-10-CM | POA: Insufficient documentation

## 2014-08-29 DIAGNOSIS — Z79899 Other long term (current) drug therapy: Secondary | ICD-10-CM | POA: Diagnosis not present

## 2014-08-29 DIAGNOSIS — Z8659 Personal history of other mental and behavioral disorders: Secondary | ICD-10-CM | POA: Insufficient documentation

## 2014-08-29 MED ORDER — HYDROMORPHONE HCL 1 MG/ML IJ SOLN
1.0000 mg | Freq: Once | INTRAMUSCULAR | Status: AC
  Administered 2014-08-29: 1 mg via INTRAVENOUS
  Filled 2014-08-29: qty 1
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
## 2014-08-29 NOTE — ED Notes (Signed)
Per EMS: [place] with c/o possible seizure per family. Upon EMS arrival pt was unresponsive, pupils equal and reactive, respirations equal and unlabored, skin warm and dry. Pt now alert and answering questions. Pt states she doesn't know where she is. Speech is clear.

## 2014-08-30 ENCOUNTER — Emergency Department (HOSPITAL_COMMUNITY): Payer: Medicaid Other

## 2014-08-30 LAB — COMPREHENSIVE METABOLIC PANEL
ALT: 9 U/L — ABNORMAL LOW (ref 14–54)
AST: 17 U/L (ref 15–41)
Albumin: 3.4 g/dL — ABNORMAL LOW (ref 3.5–5.0)
Alkaline Phosphatase: 90 U/L (ref 38–126)
Anion gap: 9 (ref 5–15)
BUN: 12 mg/dL (ref 6–20)
CHLORIDE: 100 mmol/L — AB (ref 101–111)
CO2: 28 mmol/L (ref 22–32)
CREATININE: 0.78 mg/dL (ref 0.44–1.00)
Calcium: 9 mg/dL (ref 8.9–10.3)
GFR calc non Af Amer: >60 mL/min (ref >60)
GLUCOSE: 142 mg/dL — AB (ref 65–99)
Potassium: 4 mmol/L (ref 3.5–5.1)
Sodium: 137 mmol/L (ref 135–145)
Total Bilirubin: 0.3 mg/dL (ref 0.3–1.2)
Total Protein: 6.3 g/dL — ABNORMAL LOW (ref 6.5–8.1)

## 2014-08-30 LAB — CBC WITH DIFFERENTIAL/PLATELET
BAND NEUTROPHILS: 0 % (ref 0–10)
Basophils Absolute: 0 10*3/uL (ref 0.0–0.1)
Basophils Relative: 0 % (ref 0–1)
Blasts: 0 %
EOS PCT: 2 % (ref 0–5)
Eosinophils Absolute: 0.2 10*3/uL (ref 0.0–0.7)
HCT: 39.4 % (ref 36.0–46.0)
Hemoglobin: 13.3 g/dL (ref 12.0–15.0)
LYMPHS PCT: 43 % (ref 12–46)
Lymphs Abs: 4.3 10*3/uL — ABNORMAL HIGH (ref 0.7–4.0)
MCH: 30.9 pg (ref 26.0–34.0)
MCHC: 33.8 g/dL (ref 30.0–36.0)
MCV: 91.4 fL (ref 78.0–100.0)
METAMYELOCYTES PCT: 0 %
Monocytes Absolute: 0.3 10*3/uL (ref 0.1–1.0)
Monocytes Relative: 3 % (ref 3–12)
Myelocytes: 0 %
Neutro Abs: 5.3 10*3/uL (ref 1.7–7.7)
Neutrophils Relative %: 52 % (ref 43–77)
OTHER: 0 %
PLATELETS: 148 10*3/uL — AB (ref 150–400)
Promyelocytes Absolute: 0 %
RBC: 4.31 MIL/uL (ref 3.87–5.11)
RDW: 13.1 % (ref 11.5–15.5)
WBC: 10.1 10*3/uL (ref 4.0–10.5)
nRBC: 0 /100 WBC

## 2014-08-30 LAB — LIPASE, BLOOD: Lipase: 30 U/L (ref 22–51)

## 2014-08-30 LAB — VALPROIC ACID LEVEL: Valproic Acid Lvl: 38 ug/mL — ABNORMAL LOW (ref 50.0–100.0)

## 2014-08-30 MED ORDER — DIVALPROEX SODIUM 250 MG PO DR TAB
500.0000 mg | DELAYED_RELEASE_TABLET | Freq: Once | ORAL | Status: AC
  Administered 2014-08-30: 500 mg via ORAL
  Filled 2014-08-30: qty 2

## 2014-08-30 NOTE — ED Notes (Signed)
Pt A&OX4, ambulatory at d/c with steady gait, NAD 

## 2014-08-30 NOTE — Discharge Instructions (Signed)
Nonepileptic Seizures °Nonepileptic seizures are seizures that are not caused by abnormal electrical signals in your brain. These seizures often seem like epileptic seizures, but they are not caused by epilepsy.  °There are two types of nonepileptic seizures: °· A physiologic nonepileptic seizure results from a disruption in your brain. °· A psychogenic seizure results from emotional stress. These seizures are sometimes called pseudoseizures. °CAUSES  °Causes of physiologic nonepileptic seizures include:  °· Sudden drop in blood pressure. °· Low blood sugar. °· Low levels of salt (sodium) in your blood. °· Low levels of calcium in your blood. °· Migraine. °· Heart rhythm problems. °· Sleep disorders. °· Drug and alcohol abuse. °Common causes of psychogenic nonepileptic seizures include: °· Stress. °· Emotional trauma. °· Sexual or physical abuse. °· Major life events, such as divorce or the death of a loved one. °· Mental health disorders, including panic attack and hyperactivity disorder. °SIGNS AND SYMPTOMS °A nonepileptic seizure can look like an epileptic seizure, including uncontrollable shaking (convulsions), or changes in attention, behavior, or the ability to remain awake and alert. However, there are some differences. Nonepileptic seizures usually: °· Do not cause physical injuries. °· Start slowly. °· Include crying or shrieking. °· Last longer than 2 minutes. °· Have a short recovery time without headache or exhaustion. °DIAGNOSIS  °Your health care provider can usually diagnose nonepileptic seizures after taking your medical history and giving you a physical exam. Your health care provider may want to talk to your friends or relatives who have seen you have a seizure.  °You may also need to have tests to look for causes of physiologic nonepileptic seizures. This may include an electroencephalogram (EEG), which is a test that measures electrical activity in your brain. If you have had an epileptic  seizure, the results of your EEG will be abnormal. If your health care provider thinks you have had a psychogenic nonepileptic seizure, you may need to see a mental health specialist for an evaluation. °TREATMENT  °Treatment depends on the type and cause of your seizures. °· For physiologic nonepileptic seizures, treatment is aimed at addressing the underlying condition that caused the seizures. These seizures usually stop when the underlying condition is properly treated. °· Nonepileptic seizures do not respond to the seizure medicines used to treat epilepsy. °· For psychogenic seizures, you may need to work with a mental health specialist. °HOME CARE INSTRUCTIONS °Home care will depend on the type of nonepileptic seizures you have.  °· Follow all your health care provider's instructions. °· Keep all your follow-up appointments. °SEEK MEDICAL CARE IF: °You continue to have seizures after treatment. °SEEK IMMEDIATE MEDICAL CARE IF: °· Your seizures change or become more frequent. °· You injure yourself during a seizure. °· You have one seizure after another. °· You have trouble recovering from a seizure. °· You have chest pain or trouble breathing. °MAKE SURE YOU: °· Understand these instructions. °· Will watch your condition. °· Will get help right away if you are not doing well or get worse. °Document Released: 04/24/2005 Document Revised: 07/24/2013 Document Reviewed: 01/03/2013 °[employer]® Patient Information ©2015 [employer]. This information is not intended to replace advice given to you by your health care provider. Make sure you discuss any questions you have with your health care provider. ° °

## 2014-09-05 ENCOUNTER — Encounter (HOSPITAL_COMMUNITY): Payer: Self-pay | Admitting: Emergency Medicine

## 2014-09-05 ENCOUNTER — Emergency Department (HOSPITAL_COMMUNITY)
Admission: EM | Admit: 2014-09-05 | Discharge: 2014-09-05 | Discharge status: Against Medical Advice | Payer: Medicaid Other | Attending: Emergency Medicine | Admitting: Emergency Medicine

## 2014-09-05 DIAGNOSIS — Z720 Tobacco use: Secondary | ICD-10-CM | POA: Diagnosis not present

## 2014-09-05 DIAGNOSIS — Z85818 Personal history of malignant neoplasm of other sites of lip, oral cavity, and pharynx: Secondary | ICD-10-CM | POA: Insufficient documentation

## 2014-09-05 DIAGNOSIS — I2510 Atherosclerotic heart disease of native coronary artery without angina pectoris: Secondary | ICD-10-CM | POA: Insufficient documentation

## 2014-09-05 DIAGNOSIS — I10 Essential (primary) hypertension: Secondary | ICD-10-CM | POA: Diagnosis not present

## 2014-09-05 DIAGNOSIS — E669 Obesity, unspecified: Secondary | ICD-10-CM | POA: Diagnosis not present

## 2014-09-05 DIAGNOSIS — Z79899 Other long term (current) drug therapy: Secondary | ICD-10-CM | POA: Insufficient documentation

## 2014-09-05 DIAGNOSIS — Z794 Long term (current) use of insulin: Secondary | ICD-10-CM | POA: Diagnosis not present

## 2014-09-05 DIAGNOSIS — R251 Tremor, unspecified: Secondary | ICD-10-CM | POA: Diagnosis present

## 2014-09-05 DIAGNOSIS — F419 Anxiety disorder, unspecified: Secondary | ICD-10-CM | POA: Insufficient documentation

## 2014-09-05 DIAGNOSIS — J449 Chronic obstructive pulmonary disease, unspecified: Secondary | ICD-10-CM | POA: Diagnosis not present

## 2014-09-05 DIAGNOSIS — Z880 Allergy status to penicillin: Secondary | ICD-10-CM | POA: Diagnosis not present

## 2014-09-05 DIAGNOSIS — G40909 Epilepsy, unspecified, not intractable, without status epilepticus: Secondary | ICD-10-CM | POA: Insufficient documentation

## 2014-09-05 DIAGNOSIS — G43909 Migraine, unspecified, not intractable, without status migrainosus: Secondary | ICD-10-CM | POA: Diagnosis not present

## 2014-09-05 DIAGNOSIS — E119 Type 2 diabetes mellitus without complications: Secondary | ICD-10-CM | POA: Diagnosis not present

## 2014-09-05 LAB — URINALYSIS, ROUTINE W REFLEX MICROSCOPIC
Bilirubin Urine: NEGATIVE
Glucose, UA: NEGATIVE mg/dL
Ketones, ur: NEGATIVE mg/dL
Nitrite: NEGATIVE
PROTEIN: NEGATIVE mg/dL
Specific Gravity, Urine: 1.028 (ref 1.005–1.030)
UROBILINOGEN UA: 1 mg/dL (ref 0.0–1.0)
pH: 6 (ref 5.0–8.0)

## 2014-09-05 LAB — CBC WITH DIFFERENTIAL/PLATELET
BASOS ABS: 0 10*3/uL (ref 0.0–0.1)
BASOS PCT: 0 % (ref 0–1)
EOS PCT: 1 % (ref 0–5)
Eosinophils Absolute: 0.1 10*3/uL (ref 0.0–0.7)
HCT: 41.1 % (ref 36.0–46.0)
HEMOGLOBIN: 13.4 g/dL (ref 12.0–15.0)
Lymphocytes Relative: 32 % (ref 12–46)
Lymphs Abs: 2.6 10*3/uL (ref 0.7–4.0)
MCH: 30.8 pg (ref 26.0–34.0)
MCHC: 32.6 g/dL (ref 30.0–36.0)
MCV: 94.5 fL (ref 78.0–100.0)
Monocytes Absolute: 0.7 10*3/uL (ref 0.1–1.0)
Monocytes Relative: 9 % (ref 3–12)
Neutro Abs: 4.6 10*3/uL (ref 1.7–7.7)
Neutrophils Relative %: 58 % (ref 43–77)
Platelets: 169 10*3/uL (ref 150–400)
RBC: 4.35 MIL/uL (ref 3.87–5.11)
RDW: 13.4 % (ref 11.5–15.5)
WBC: 8 10*3/uL (ref 4.0–10.5)

## 2014-09-05 LAB — BASIC METABOLIC PANEL
Anion gap: 11 (ref 5–15)
BUN: 15 mg/dL (ref 6–20)
CO2: 23 mmol/L (ref 22–32)
Calcium: 9.2 mg/dL (ref 8.9–10.3)
Chloride: 108 mmol/L (ref 101–111)
Creatinine, Ser: 0.71 mg/dL (ref 0.44–1.00)
GFR calc Af Amer: >60 mL/min (ref >60)
GLUCOSE: 135 mg/dL — AB (ref 65–99)
POTASSIUM: 3.9 mmol/L (ref 3.5–5.1)
SODIUM: 142 mmol/L (ref 135–145)

## 2014-09-05 LAB — RAPID URINE DRUG SCREEN, HOSP PERFORMED
Amphetamines: NOT DETECTED
BARBITURATES: NOT DETECTED
BENZODIAZEPINES: NOT DETECTED
Cocaine: NOT DETECTED
Opiates: NOT DETECTED
Tetrahydrocannabinol: NOT DETECTED

## 2014-09-05 LAB — URINE MICROSCOPIC-ADD ON

## 2014-09-05 LAB — VALPROIC ACID LEVEL: Valproic Acid Lvl: 25 ug/mL — ABNORMAL LOW (ref 50.0–100.0)

## 2014-09-05 MED ORDER — SODIUM CHLORIDE 0.9 % IV BOLUS (SEPSIS): 1000.0000 mL | Freq: Once | INTRAVENOUS | Status: DC

## 2014-09-05 NOTE — ED Notes (Signed)
Pt left room refused to come back for vitals. RN notified

## 2014-09-05 NOTE — ED Notes (Signed)
Bed: BV67 Expected date:  Expected time:  Means of arrival:  Comments: "behavioral convulsions"
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
## 2014-09-05 NOTE — ED Provider Notes (Signed)
CSN: [id number]     Arrival date & time 09/05/14  1615 History   First MD Initiated Contact with Patient 09/05/14 1641     No chief complaint on file.    (Consider location/radiation/quality/duration/timing/severity/associated sxs/prior Treatment) Patient is a 49 y.o. female presenting with seizures.  Seizures Seizure type: per daughter, she had 3 "back to back" seizures, which looked like "she was trying not to swallow her tongue". Preceding symptoms comment:  Anxiety, emotional upset.  Episode characteristics comment:  Generalized shaking Postictal symptoms: confusion   Postictal symptoms comment:  "she gets amnesia after her seizures." Severity:  Severe Duration: unknown. Context comment:  Emotional upset because her significant other was arrested.   Past Medical History  Diagnosis Date  . Hypertension   . Seizures   . COPD (chronic obstructive pulmonary disease)   . Diabetes mellitus without complication   . Sleep apnea   . Coronary artery disease   . Schizophrenia   . Migraine   . Addiction to drug   . Obesity   . Cancer throat  . Throat cancer    Past Surgical History  Procedure Laterality Date  . Abdominal hysterectomy    . Appendectomy    . Cholecystectomy    . Eye surgery    . Cesarean section    . Knee surgery     Family History  Problem Relation Age of Onset  . Cancer Father    History  Substance Use Topics  . Smoking status: Current Every Day Smoker -- 0.50 packs/day for 30 years    Types: Cigarettes  . Smokeless tobacco: Never Used  . Alcohol Use: No   OB History    No data available     Review of Systems  Neurological: Positive for seizures.  All other systems reviewed and are negative.     Allergies  Aspirin; Bee venom; Other; Toradol; Penicillins; and Vancomycin  Home Medications   Prior to Admission medications   Medication Sig Start Date End Date Taking? Authorizing Provider  albuterol (PROVENTIL HFA;VENTOLIN HFA) 108 (90  BASE) MCG/ACT inhaler Inhale 2 puffs into the lungs every 6 (six) hours as needed. Shortness of breath 10/27/10  Yes Historical Provider, MD  chlorhexidine (PERIDEX) 0.12 % solution 15 mLs 2 (two) times daily.   Yes Historical Provider, MD  citalopram (CELEXA) 40 MG tablet Take 40 mg by mouth at bedtime.   Yes Historical Provider, MD  divalproex (DEPAKOTE ER) 500 MG 24 hr tablet Take 2 tablets (1,000 mg total) by mouth daily. Patient taking differently: Take 500 mg by mouth 2 (two) times daily.  10/12/13  Yes Elyn Peers, MD  gabapentin (NEURONTIN) 300 MG capsule Take 300 mg by mouth 2 (two) times daily. At lunch and bedtime   Yes Historical Provider, MD  insulin glargine (LANTUS) 100 UNIT/ML injection Inject 10 Units into the skin at bedtime.   Yes Historical Provider, MD  lisinopril-hydrochlorothiazide (PRINZIDE,ZESTORETIC) 20-25 MG per tablet Take 1 tablet by mouth every morning.   Yes Historical Provider, MD  LORazepam (ATIVAN) 1 MG tablet Take 0.5-1 mg by mouth every 8 (eight) hours as needed. For anxiety.   Yes Historical Provider, MD  metFORMIN (GLUCOPHAGE) 500 MG tablet Take 500 mg by mouth 2 (two) times daily with a meal.    Yes Historical Provider, MD  nitroGLYCERIN (NITROSTAT) 0.4 MG SL tablet Place 0.4 mg under the tongue every 5 (five) minutes as needed for chest pain.   Yes Historical Provider, MD  pravastatin (PRAVACHOL) 20  MG tablet Take 20 mg by mouth daily.   Yes Historical Provider, MD  cyclobenzaprine (FLEXERIL) 10 MG tablet Take 1 tablet (10 mg total) by mouth 3 (three) times daily as needed for muscle spasms. Patient not taking: Reported on 08/03/2014 12/15/13   Clayton Bibles, PA-C  cyclobenzaprine (FLEXERIL) 10 MG tablet Take 1 tablet (10 mg total) by mouth 3 (three) times daily as needed for muscle spasms (or pain). Patient not taking: Reported on 08/03/2014 01/15/14   Clayton Bibles, PA-C   Pulse 110  Temp(Src) 98.7 F (37.1 C) (Oral)  Resp 20  SpO2 98% Physical Exam   Constitutional: She is oriented to person, place, and time. She appears well-developed and well-nourished. No distress.  Disheveled.  HENT:  Head: Normocephalic and atraumatic.  Mouth/Throat: Oropharynx is clear and moist.  Eyes: Conjunctivae are normal. Pupils are equal, round, and reactive to light. No scleral icterus.  Neck: Neck supple.  Cardiovascular: Normal rate, regular rhythm, normal heart sounds and intact distal pulses.   No murmur heard. Pulmonary/Chest: Effort normal and breath sounds normal. No stridor. No respiratory distress. She has no rales.  Abdominal: Soft. Bowel sounds are normal. She exhibits no distension. There is no tenderness.  Musculoskeletal: Normal range of motion.  Neurological: She is alert and oriented to person, place, and time.  Refused to perform strength testing with BLE and LUE.  However, when her God-Daughter tried to leave the room, she cried out and extended all extremities with good strength.   Skin: Skin is warm and dry. No rash noted.  Psychiatric: Her mood appears anxious. Her affect is labile. She is agitated.  Intermittently crying  Nursing note and vitals reviewed.   ED Course  Procedures (including critical care time) Labs Review Labs Reviewed  BASIC METABOLIC PANEL - Abnormal; Notable for the following:    Glucose, Bld 135 (*)    All other components within normal limits  URINALYSIS, ROUTINE W REFLEX MICROSCOPIC (NOT AT [facility], [facility], [facility]) - Abnormal; Notable for the following:    Color, Urine AMBER (*)    APPearance CLOUDY (*)    Hgb urine dipstick TRACE (*)    Leukocytes, UA LARGE (*)    All other components within normal limits  VALPROIC ACID LEVEL - Abnormal; Notable for the following:    Valproic Acid Lvl 25 (*)    All other components within normal limits  URINE MICROSCOPIC-ADD ON - Abnormal; Notable for the following:    Squamous Epithelial / LPF MANY (*)    Bacteria, UA FEW (*)    Crystals CA OXALATE CRYSTALS (*)    All other  components within normal limits  CBC WITH DIFFERENTIAL/PLATELET  URINE RAPID DRUG SCREEN, HOSP PERFORMED    Imaging Review No results found.   EKG Interpretation   Date/Time:  Wednesday September 05 2014 17:13:30 EDT Ventricular Rate:  60 PR Interval:  176 QRS Duration: 96 QT Interval:  422 QTC Calculation: 422 R Axis:   93 Text Interpretation:  Sinus rhythm Borderline right axis deviation No  significant change was found Confirmed by [facility]  MD, TREY ([id number]) on  09/05/2014 6:27:49 PM      MDM   Final diagnoses:  Episode of shaking    50 yo female who was found in the middle of the street after reportedly having 3 seizures.  It is unclear whether she truly had seizures or not.  However, witness report and EMS report would suggest that her episodes were not truly seizures.  She is anxious  and uncooperative with exam and her visitors appear to be instigating her behavior to some degree.  However, will check labs, including depakote, and monitor.  Labs showed low depakote level, similar to recent ED visit.  She, unfortunately, left AMA prior to my reassessment.  She was advised by nursing to follow up closely with her PCP .     Serita Grit, MD 09/06/14 [phone]

## 2014-09-05 NOTE — ED Notes (Addendum)
Pt states she wants to leave due to not receiving any pain medication. Pt was threatening and belligerent and began pulling at wires and walking around room without any obvious signs of pain, injury, or discomfort.  Pt dressed independently and demanded her IV be removed.  Dr. Doy Mince was notified and requested to speak with patient prior to leaving.  Pt refused to wait and ambulated out of room.  Pt began limping when walking in hallway.  Pt was asked to have vitals taken prior to leaving and refused.  Pt in no obvious distress when leaving.  Pt was advised to return to ER or see primary MD if symptoms persist.  Pt verbalized understanding.
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
## 2014-09-17 ENCOUNTER — Emergency Department (HOSPITAL_COMMUNITY)
Admission: EM | Admit: 2014-09-17 | Discharge: 2014-09-17 | Disposition: A | Discharge status: Home / Self Care | Payer: Medicaid Other | Attending: Emergency Medicine | Admitting: Emergency Medicine

## 2014-09-17 ENCOUNTER — Emergency Department (HOSPITAL_COMMUNITY): Payer: Medicaid Other

## 2014-09-17 ENCOUNTER — Encounter (HOSPITAL_COMMUNITY): Payer: Self-pay

## 2014-09-17 DIAGNOSIS — Z794 Long term (current) use of insulin: Secondary | ICD-10-CM | POA: Insufficient documentation

## 2014-09-17 DIAGNOSIS — J449 Chronic obstructive pulmonary disease, unspecified: Secondary | ICD-10-CM | POA: Insufficient documentation

## 2014-09-17 DIAGNOSIS — G40909 Epilepsy, unspecified, not intractable, without status epilepticus: Secondary | ICD-10-CM | POA: Insufficient documentation

## 2014-09-17 DIAGNOSIS — E669 Obesity, unspecified: Secondary | ICD-10-CM | POA: Insufficient documentation

## 2014-09-17 DIAGNOSIS — Z85818 Personal history of malignant neoplasm of other sites of lip, oral cavity, and pharynx: Secondary | ICD-10-CM | POA: Diagnosis not present

## 2014-09-17 DIAGNOSIS — F1410 Cocaine abuse, uncomplicated: Secondary | ICD-10-CM | POA: Diagnosis not present

## 2014-09-17 DIAGNOSIS — R55 Syncope and collapse: Secondary | ICD-10-CM | POA: Diagnosis not present

## 2014-09-17 DIAGNOSIS — Z880 Allergy status to penicillin: Secondary | ICD-10-CM | POA: Insufficient documentation

## 2014-09-17 DIAGNOSIS — Z3202 Encounter for pregnancy test, result negative: Secondary | ICD-10-CM | POA: Diagnosis not present

## 2014-09-17 DIAGNOSIS — Z79899 Other long term (current) drug therapy: Secondary | ICD-10-CM | POA: Diagnosis not present

## 2014-09-17 DIAGNOSIS — G43909 Migraine, unspecified, not intractable, without status migrainosus: Secondary | ICD-10-CM | POA: Diagnosis not present

## 2014-09-17 DIAGNOSIS — R569 Unspecified convulsions: Secondary | ICD-10-CM

## 2014-09-17 DIAGNOSIS — R51 Headache: Secondary | ICD-10-CM | POA: Diagnosis present

## 2014-09-17 DIAGNOSIS — I10 Essential (primary) hypertension: Secondary | ICD-10-CM | POA: Insufficient documentation

## 2014-09-17 DIAGNOSIS — F209 Schizophrenia, unspecified: Secondary | ICD-10-CM | POA: Insufficient documentation

## 2014-09-17 DIAGNOSIS — Z720 Tobacco use: Secondary | ICD-10-CM | POA: Diagnosis not present

## 2014-09-17 DIAGNOSIS — I2510 Atherosclerotic heart disease of native coronary artery without angina pectoris: Secondary | ICD-10-CM | POA: Diagnosis not present

## 2014-09-17 DIAGNOSIS — E119 Type 2 diabetes mellitus without complications: Secondary | ICD-10-CM | POA: Diagnosis not present

## 2014-09-17 DIAGNOSIS — R519 Headache, unspecified: Secondary | ICD-10-CM

## 2014-09-17 LAB — CBC WITH DIFFERENTIAL/PLATELET
Basophils Absolute: 0 10*3/uL (ref 0.0–0.1)
Basophils Relative: 0 % (ref 0–1)
EOS ABS: 0.1 10*3/uL (ref 0.0–0.7)
EOS PCT: 2 % (ref 0–5)
HCT: 38.5 % (ref 36.0–46.0)
HEMOGLOBIN: 12.5 g/dL (ref 12.0–15.0)
LYMPHS ABS: 3.5 10*3/uL (ref 0.7–4.0)
Lymphocytes Relative: 44 % (ref 12–46)
MCH: 30.2 pg (ref 26.0–34.0)
MCHC: 32.5 g/dL (ref 30.0–36.0)
MCV: 93 fL (ref 78.0–100.0)
MONOS PCT: 6 % (ref 3–12)
Monocytes Absolute: 0.5 10*3/uL (ref 0.1–1.0)
Neutro Abs: 3.9 10*3/uL (ref 1.7–7.7)
Neutrophils Relative %: 48 % (ref 43–77)
PLATELETS: 187 10*3/uL (ref 150–400)
RBC: 4.14 MIL/uL (ref 3.87–5.11)
RDW: 13.2 % (ref 11.5–15.5)
WBC: 8.1 10*3/uL (ref 4.0–10.5)

## 2014-09-17 LAB — URINALYSIS, ROUTINE W REFLEX MICROSCOPIC
BILIRUBIN URINE: NEGATIVE
GLUCOSE, UA: NEGATIVE mg/dL
KETONES UR: NEGATIVE mg/dL
Nitrite: NEGATIVE
PH: 6 (ref 5.0–8.0)
Protein, ur: NEGATIVE mg/dL
Specific Gravity, Urine: 1.019 (ref 1.005–1.030)
Urobilinogen, UA: 0.2 mg/dL (ref 0.0–1.0)

## 2014-09-17 LAB — COMPREHENSIVE METABOLIC PANEL
ALBUMIN: 3.8 g/dL (ref 3.5–5.0)
ALK PHOS: 78 U/L (ref 38–126)
ALT: 12 U/L — ABNORMAL LOW (ref 14–54)
ANION GAP: 9 (ref 5–15)
AST: 24 U/L (ref 15–41)
BILIRUBIN TOTAL: 0.4 mg/dL (ref 0.3–1.2)
BUN: 14 mg/dL (ref 6–20)
CALCIUM: 9.2 mg/dL (ref 8.9–10.3)
CO2: 28 mmol/L (ref 22–32)
Chloride: 102 mmol/L (ref 101–111)
Creatinine, Ser: 0.74 mg/dL (ref 0.44–1.00)
GFR calc Af Amer: >60 mL/min (ref >60)
GFR calc non Af Amer: >60 mL/min (ref >60)
Glucose, Bld: 144 mg/dL — ABNORMAL HIGH (ref 65–99)
POTASSIUM: 3.7 mmol/L (ref 3.5–5.1)
SODIUM: 139 mmol/L (ref 135–145)
TOTAL PROTEIN: 7 g/dL (ref 6.5–8.1)

## 2014-09-17 LAB — URINE MICROSCOPIC-ADD ON

## 2014-09-17 LAB — RAPID URINE DRUG SCREEN, HOSP PERFORMED
Amphetamines: NOT DETECTED
Barbiturates: NOT DETECTED
Benzodiazepines: NOT DETECTED
Cocaine: POSITIVE — AB
OPIATES: NOT DETECTED
Tetrahydrocannabinol: NOT DETECTED

## 2014-09-17 LAB — VALPROIC ACID LEVEL: Valproic Acid Lvl: 37 ug/mL — ABNORMAL LOW (ref 50.0–100.0)

## 2014-09-17 LAB — PREGNANCY, URINE: Preg Test, Ur: NEGATIVE

## 2014-09-17 MED ORDER — DIPHENHYDRAMINE HCL 50 MG/ML IJ SOLN
25.0000 mg | Freq: Once | INTRAMUSCULAR | Status: AC
  Administered 2014-09-17: 25 mg via INTRAVENOUS
  Filled 2014-09-17: qty 1

## 2014-09-17 MED ORDER — METOCLOPRAMIDE HCL 5 MG/ML IJ SOLN
10.0000 mg | Freq: Once | INTRAMUSCULAR | Status: AC
  Administered 2014-09-17: 10 mg via INTRAVENOUS
  Filled 2014-09-17: qty 2

## 2014-09-17 MED ORDER — DIVALPROEX SODIUM 500 MG PO DR TAB: 500.0000 mg | DELAYED_RELEASE_TABLET | Freq: Once | ORAL | Status: DC

## 2014-09-17 NOTE — ED Provider Notes (Signed)
CSN: [id number]     Arrival date & time 09/17/14  1159 History   First MD Initiated Contact with Patient 09/17/14 1219     Chief Complaint  Patient presents with  . Headache     (Consider location/radiation/quality/duration/timing/severity/associated sxs/prior Treatment) Patient is a 49 y.o. female presenting with headaches.  Headache Pain location:  Generalized Quality:  Dull Radiates to:  Does not radiate Onset quality:  Unable to specify Duration:  4 hours Timing:  Constant Progression:  Unchanged Chronicity:  Recurrent Similar to prior headaches: yes   Context comment:  Had 4-5 min of seizure like activity prior to ha Relieved by:  Nothing Worsened by:  Light Associated symptoms: syncope   Associated symptoms: no abdominal pain, no cough, no diarrhea, no fever and no vomiting     Past Medical History  Diagnosis Date  . Hypertension   . Seizures   . COPD (chronic obstructive pulmonary disease)   . Diabetes mellitus without complication   . Sleep apnea   . Coronary artery disease   . Schizophrenia   . Migraine   . Addiction to drug   . Obesity   . Cancer throat  . Throat cancer    Past Surgical History  Procedure Laterality Date  . Abdominal hysterectomy    . Appendectomy    . Cholecystectomy    . Eye surgery    . Cesarean section    . Knee surgery     Family History  Problem Relation Age of Onset  . Cancer Father    History  Substance Use Topics  . Smoking status: Current Every Day Smoker -- 0.50 packs/day for 30 years    Types: Cigarettes  . Smokeless tobacco: Never Used  . Alcohol Use: No   OB History    No data available     Review of Systems  Constitutional: Negative for fever.  Respiratory: Negative for cough.   Cardiovascular: Positive for syncope.  Gastrointestinal: Negative for vomiting, abdominal pain and diarrhea.  Neurological: Positive for headaches.  All other systems reviewed and are negative.     Allergies  Aspirin; Bee  venom; Other; Toradol; Tramadol; Penicillins; and Vancomycin  Home Medications   Prior to Admission medications   Medication Sig Start Date End Date Taking? Authorizing Provider  albuterol (PROVENTIL HFA;VENTOLIN HFA) 108 (90 BASE) MCG/ACT inhaler Inhale 2 puffs into the lungs every 6 (six) hours as needed. Shortness of breath 10/27/10  Yes Historical Provider, MD  divalproex (DEPAKOTE ER) 500 MG 24 hr tablet Take 2 tablets (1,000 mg total) by mouth daily. Patient taking differently: Take 500 mg by mouth 2 (two) times daily.  10/12/13  Yes Elyn Peers, MD  gabapentin (NEURONTIN) 300 MG capsule Take 300 mg by mouth at bedtime.    Yes Historical Provider, MD  insulin glargine (LANTUS) 100 UNIT/ML injection Inject 10 Units into the skin at bedtime.   Yes Historical Provider, MD  lisinopril-hydrochlorothiazide (PRINZIDE,ZESTORETIC) 20-25 MG per tablet Take 1 tablet by mouth every morning.   Yes Historical Provider, MD  metFORMIN (GLUCOPHAGE) 500 MG tablet Take 500 mg by mouth 2 (two) times daily with a meal.    Yes Historical Provider, MD  citalopram (CELEXA) 40 MG tablet Take 40 mg by mouth at bedtime.    Historical Provider, MD  cyclobenzaprine (FLEXERIL) 10 MG tablet Take 1 tablet (10 mg total) by mouth 3 (three) times daily as needed for muscle spasms. Patient not taking: Reported on 08/03/2014 12/15/13   Clayton Bibles, PA-C  cyclobenzaprine (FLEXERIL) 10 MG tablet Take 1 tablet (10 mg total) by mouth 3 (three) times daily as needed for muscle spasms (or pain). Patient not taking: Reported on 08/03/2014 01/15/14   Clayton Bibles, PA-C  LORazepam (ATIVAN) 1 MG tablet Take 0.5-1 mg by mouth every 8 (eight) hours as needed. For anxiety.    Historical Provider, MD  nitroGLYCERIN (NITROSTAT) 0.4 MG SL tablet Place 0.4 mg under the tongue every 5 (five) minutes as needed for chest pain.    Historical Provider, MD   BP 138/72 mmHg  Pulse 70  Temp(Src) 98.1 F (36.7 C) (Oral)  Resp 16  Ht 5\' 2"  (1.575 m)  Wt  219 lb (99.338 kg)  BMI 40.05 kg/m2  SpO2 100% Physical Exam  Constitutional: She is oriented to person, place, and time. She appears well-developed and well-nourished.  HENT:  Head: Normocephalic and atraumatic.  Right Ear: External ear normal.  Left Ear: External ear normal.  Eyes: Conjunctivae and EOM are normal. Pupils are equal, round, and reactive to light.  Neck: Normal range of motion. Neck supple.  Cardiovascular: Normal rate, regular rhythm, normal heart sounds and intact distal pulses.   Pulmonary/Chest: Effort normal and breath sounds normal.  Abdominal: Soft. Bowel sounds are normal. There is no tenderness.  Musculoskeletal: Normal range of motion.  Neurological: She is alert and oriented to person, place, and time. She has normal strength. No cranial nerve deficit or sensory deficit. GCS eye subscore is 4. GCS verbal subscore is 5. GCS motor subscore is 6.  Skin: Skin is warm and dry.  Vitals reviewed.   ED Course  Procedures (including critical care time) Labs Review Labs Reviewed  COMPREHENSIVE METABOLIC PANEL - Abnormal; Notable for the following:    Glucose, Bld 144 (*)    ALT 12 (*)    All other components within normal limits  URINALYSIS, ROUTINE W REFLEX MICROSCOPIC (NOT AT [facility]) - Abnormal; Notable for the following:    Hgb urine dipstick TRACE (*)    Leukocytes, UA TRACE (*)    All other components within normal limits  URINE RAPID DRUG SCREEN, HOSP PERFORMED - Abnormal; Notable for the following:    Cocaine POSITIVE (*)    All other components within normal limits  VALPROIC ACID LEVEL - Abnormal; Notable for the following:    Valproic Acid Lvl 37 (*)    All other components within normal limits  URINE MICROSCOPIC-ADD ON - Abnormal; Notable for the following:    Squamous Epithelial / LPF FEW (*)    Bacteria, UA FEW (*)    All other components within normal limits  CBC WITH DIFFERENTIAL/PLATELET  PREGNANCY, URINE    Imaging Review Ct Head Wo  Contrast  09/17/2014   CLINICAL DATA:  Migraine headaches.  EXAM: CT HEAD WITHOUT CONTRAST  TECHNIQUE: Contiguous axial images were obtained from the base of the skull through the vertex without intravenous contrast.  COMPARISON:  CT scan of August 30, 2014.  FINDINGS: Bony calvarium appears intact. No mass effect or midline shift is noted. Ventricular size is within normal limits. There is no evidence of mass lesion, hemorrhage or acute infarction.  IMPRESSION: Normal head CT.   Electronically Signed   By: Marijo Conception, M.D.   On: 09/17/2014 15:56     EKG Interpretation None      MDM   Final diagnoses:  Acute nonintractable headache, unspecified headache type  Seizure    49 y.o. female with pertinent PMH of seizures on depakote (  intermittently compliant), schizophrenia, migraine presents with recurrent seizure like activity and ha.  Has had prior visits, including most recently, with same symptoms.  Physical exam benign, pt at baseline on my examination.    Wu as above with low depakote level, which would be expected.  HA relieved with reglan and benadryl.  DC home in stable condition  I have reviewed all laboratory and imaging studies if ordered as above  1. Acute nonintractable headache, unspecified headache type   2. Seizure         Debby Freiberg, MD 09/18/14 [phone]

## 2014-09-17 NOTE — ED Notes (Addendum)
Pt now states friends were present and state she thought she was having a seizure. Pt c/o of a throbbing headache and pain down each arm. Pt c/o of dizziness. Pt reports she has not had anything to eat today. Pt also states she may have taken too many BP meds today however when her husband arrived she informed him she would discuss it later

## 2014-09-17 NOTE — ED Notes (Signed)
Per [facility]- Pt states Migraine started 1 hour ago. HX of the same. Get a shot. Here 2-3 time a week for the same. Denies N/V/ fever. Neuro intact

## 2014-09-17 NOTE — Discharge Instructions (Signed)
Seizure, Adult A seizure is abnormal electrical activity in the brain. Seizures usually last from 30 seconds to 2 minutes. There are various types of seizures. Before a seizure, you may have a warning sensation (aura) that a seizure is about to occur. An aura may include the following symptoms:   Fear or anxiety.  Nausea.  Feeling like the room is spinning (vertigo).  Vision changes, such as seeing flashing lights or spots. Common symptoms during a seizure include:  A change in attention or behavior (altered mental status).  Convulsions with rhythmic jerking movements.  Drooling.  Rapid eye movements.  Grunting.  Loss of bladder and bowel control.  Bitter taste in the mouth.  Tongue biting. After a seizure, you may feel confused and sleepy. You may also have an injury resulting from convulsions during the seizure. HOME CARE INSTRUCTIONS   If you are given medicines, take them exactly as prescribed by your health care provider.  Keep all follow-up appointments as directed by your health care provider.  Do not swim or drive or engage in risky activity during which a seizure could cause further injury to you or others until your health care provider says it is OK.  Get adequate rest.  Teach friends and family what to do if you have a seizure. They should:  Lay you on the ground to prevent a fall.  Put a cushion under your head.  Loosen any tight clothing around your neck.  Turn you on your side. If vomiting occurs, this helps keep your airway clear.  Stay with you until you recover.  Know whether or not you need emergency care. SEEK IMMEDIATE MEDICAL CARE IF:  The seizure lasts longer than 5 minutes.  The seizure is severe or you do not wake up immediately after the seizure.  You have an altered mental status after the seizure.  You are having more frequent or worsening seizures. Someone should drive you to the emergency department or call local emergency  services (911 in [place] MAKE SURE YOU:  Understand these instructions.  Will watch your condition.  Will get help right away if you are not doing well or get worse. Document Released: 03/06/2000 Document Revised: 12/28/2012 Document Reviewed: 10/19/2012 [facility] Patient Information 2015 [place], [place]. This information is not intended to replace advice given to you by your health care provider. Make sure you discuss any questions you have with your health care provider. General Headache Without Cause A headache is pain or discomfort felt around the head or neck area. The specific cause of a headache may not be found. There are many causes and types of headaches. A few common ones are:  Tension headaches.  Migraine headaches.  Cluster headaches.  Chronic daily headaches. HOME CARE INSTRUCTIONS   Keep all follow-up appointments with your caregiver or any specialist referral.  Only take over-the-counter or prescription medicines for pain or discomfort as directed by your caregiver.  Lie down in a dark, quiet room when you have a headache.  Keep a headache journal to find out what may trigger your migraine headaches. For example, write down:  What you eat and drink.  How much sleep you get.  Any change to your diet or medicines.  Try massage or other relaxation techniques.  Put ice packs or heat on the head and neck. Use these 3 to 4 times per day for 15 to 20 minutes each time, or as needed.  Limit stress.  Sit up straight, and do not tense your  muscles.  Quit smoking if you smoke.  Limit alcohol use.  Decrease the amount of caffeine you drink, or stop drinking caffeine.  Eat and sleep on a regular schedule.  Get 7 to 9 hours of sleep, or as recommended by your caregiver.  Keep lights dim if bright lights bother you and make your headaches worse. SEEK MEDICAL CARE IF:   You have problems with the medicines you were prescribed.  Your medicines are not  working.  You have a change from the usual headache.  You have nausea or vomiting. SEEK IMMEDIATE MEDICAL CARE IF:   Your headache becomes severe.  You have a fever.  You have a stiff neck.  You have loss of vision.  You have muscular weakness or loss of muscle control.  You start losing your balance or have trouble walking.  You feel faint or pass out.  You have severe symptoms that are different from your first symptoms. MAKE SURE YOU:   Understand these instructions.  Will watch your condition.  Will get help right away if you are not doing well or get worse. Document Released: 03/09/2005 Document Revised: 06/01/2011 Document Reviewed: 03/25/2011 [facility] Patient Information 2015 [place], [place]. This information is not intended to replace advice given to you by your health care provider. Make sure you discuss any questions you have with your health care provider.

## 2014-09-17 NOTE — Progress Notes (Signed)
pcp is [place], [address], [place] [address] [phone]

## 2014-09-17 NOTE — ED Notes (Signed)
EDPA BOWIE MADE AWARE OF PT CURRENT STATUS
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
## 2014-09-26 ENCOUNTER — Encounter (HOSPITAL_COMMUNITY): Payer: Self-pay | Admitting: *Deleted

## 2014-09-26 ENCOUNTER — Emergency Department (HOSPITAL_COMMUNITY)
Admission: EM | Admit: 2014-09-26 | Discharge: 2014-09-26 | Disposition: A | Discharge status: Home / Self Care | Payer: Medicaid Other | Attending: Emergency Medicine | Admitting: Emergency Medicine

## 2014-09-26 ENCOUNTER — Emergency Department (HOSPITAL_COMMUNITY): Payer: Medicaid Other

## 2014-09-26 DIAGNOSIS — Z880 Allergy status to penicillin: Secondary | ICD-10-CM | POA: Diagnosis not present

## 2014-09-26 DIAGNOSIS — I2510 Atherosclerotic heart disease of native coronary artery without angina pectoris: Secondary | ICD-10-CM | POA: Diagnosis not present

## 2014-09-26 DIAGNOSIS — Z794 Long term (current) use of insulin: Secondary | ICD-10-CM | POA: Insufficient documentation

## 2014-09-26 DIAGNOSIS — I10 Essential (primary) hypertension: Secondary | ICD-10-CM | POA: Diagnosis not present

## 2014-09-26 DIAGNOSIS — J449 Chronic obstructive pulmonary disease, unspecified: Secondary | ICD-10-CM | POA: Insufficient documentation

## 2014-09-26 DIAGNOSIS — G43909 Migraine, unspecified, not intractable, without status migrainosus: Secondary | ICD-10-CM | POA: Diagnosis not present

## 2014-09-26 DIAGNOSIS — G40909 Epilepsy, unspecified, not intractable, without status epilepticus: Secondary | ICD-10-CM | POA: Insufficient documentation

## 2014-09-26 DIAGNOSIS — M25461 Effusion, right knee: Secondary | ICD-10-CM | POA: Diagnosis present

## 2014-09-26 DIAGNOSIS — E669 Obesity, unspecified: Secondary | ICD-10-CM | POA: Insufficient documentation

## 2014-09-26 DIAGNOSIS — Z7951 Long term (current) use of inhaled steroids: Secondary | ICD-10-CM | POA: Insufficient documentation

## 2014-09-26 DIAGNOSIS — Z79899 Other long term (current) drug therapy: Secondary | ICD-10-CM | POA: Diagnosis not present

## 2014-09-26 DIAGNOSIS — E119 Type 2 diabetes mellitus without complications: Secondary | ICD-10-CM | POA: Diagnosis not present

## 2014-09-26 DIAGNOSIS — Z85818 Personal history of malignant neoplasm of other sites of lip, oral cavity, and pharynx: Secondary | ICD-10-CM | POA: Insufficient documentation

## 2014-09-26 DIAGNOSIS — M25561 Pain in right knee: Secondary | ICD-10-CM | POA: Diagnosis not present

## 2014-09-26 DIAGNOSIS — Z720 Tobacco use: Secondary | ICD-10-CM | POA: Insufficient documentation

## 2014-09-26 DIAGNOSIS — M25569 Pain in unspecified knee: Secondary | ICD-10-CM

## 2014-09-26 NOTE — ED Provider Notes (Signed)
CSN: [id number]     Arrival date & time 09/26/14  1824 History  This chart was scribed for Montine Circle, PA-C working with Lajean Saver, MD by Randa Evens, ED Scribe. This patient was seen in room TR10C/TR10C and the patient's care was started at 6:39 PM.      Chief Complaint  Patient presents with  . Knee Pain   Patient is a 49 y.o. female presenting with knee pain. The history is provided by the patient. No language interpreter was used.  Knee Pain  HPI Comments: Carol Hughes is a 49 y.o. female who presents to the Emergency Department complaining of right knee pain onset 1 week prior. She states she has associated swelling. Pt states that she feels as if the knee is locking on her and she has to take the opposite foot to straighten the leg. Pt denies injury or trauma to the knee. Pt does report that she does a lot of walking. Pt doesnt report numbness or tingling   Past Medical History  Diagnosis Date  . Hypertension   . Seizures   . COPD (chronic obstructive pulmonary disease)   . Diabetes mellitus without complication   . Sleep apnea   . Coronary artery disease   . Schizophrenia   . Migraine   . Addiction to drug   . Obesity   . Cancer throat  . Throat cancer    Past Surgical History  Procedure Laterality Date  . Abdominal hysterectomy    . Appendectomy    . Cholecystectomy    . Eye surgery    . Cesarean section    . Knee surgery     Family History  Problem Relation Age of Onset  . Cancer Father    History  Substance Use Topics  . Smoking status: Current Every Day Smoker -- 0.50 packs/day for 30 years    Types: Cigarettes  . Smokeless tobacco: Never Used  . Alcohol Use: No   OB History    No data available     Review of Systems  Musculoskeletal: Positive for joint swelling and arthralgias.  Neurological: Negative for numbness.    Allergies  Aspirin; Bee venom; Other; Toradol; Tramadol; Penicillins; and Vancomycin  Home Medications   Prior  to Admission medications   Medication Sig Start Date End Date Taking? Authorizing Provider  albuterol (PROVENTIL HFA;VENTOLIN HFA) 108 (90 BASE) MCG/ACT inhaler Inhale 2 puffs into the lungs every 6 (six) hours as needed. Shortness of breath 10/27/10   Historical Provider, MD  citalopram (CELEXA) 40 MG tablet Take 40 mg by mouth at bedtime.    Historical Provider, MD  cyclobenzaprine (FLEXERIL) 10 MG tablet Take 1 tablet (10 mg total) by mouth 3 (three) times daily as needed for muscle spasms. Patient not taking: Reported on 08/03/2014 12/15/13   Clayton Bibles, PA-C  cyclobenzaprine (FLEXERIL) 10 MG tablet Take 1 tablet (10 mg total) by mouth 3 (three) times daily as needed for muscle spasms (or pain). Patient not taking: Reported on 08/03/2014 01/15/14   Clayton Bibles, PA-C  divalproex (DEPAKOTE ER) 500 MG 24 hr tablet Take 2 tablets (1,000 mg total) by mouth daily. Patient taking differently: Take 500 mg by mouth 2 (two) times daily.  10/12/13   Elyn Peers, MD  gabapentin (NEURONTIN) 300 MG capsule Take 300 mg by mouth at bedtime.     Historical Provider, MD  insulin glargine (LANTUS) 100 UNIT/ML injection Inject 10 Units into the skin at bedtime.    Historical Provider, MD  lisinopril-hydrochlorothiazide (PRINZIDE,ZESTORETIC) 20-25 MG per tablet Take 1 tablet by mouth every morning.    Historical Provider, MD  LORazepam (ATIVAN) 1 MG tablet Take 0.5-1 mg by mouth every 8 (eight) hours as needed. For anxiety.    Historical Provider, MD  metFORMIN (GLUCOPHAGE) 500 MG tablet Take 500 mg by mouth 2 (two) times daily with a meal.     Historical Provider, MD  nitroGLYCERIN (NITROSTAT) 0.4 MG SL tablet Place 0.4 mg under the tongue every 5 (five) minutes as needed for chest pain.    Historical Provider, MD   BP 144/73 mmHg  Pulse 72  Temp(Src) 97.9 F (36.6 C) (Oral)  Resp 20  SpO2 99%   Physical Exam  Constitutional: She is oriented to person, place, and time. She appears well-developed and  well-nourished. No distress.  HENT:  Head: Normocephalic and atraumatic.  Eyes: Conjunctivae and EOM are normal.  Neck: Neck supple. No tracheal deviation present.  Cardiovascular: Normal rate.   Pulmonary/Chest: Effort normal. No respiratory distress.  Musculoskeletal: Normal range of motion.  Right knee tender to palpation over the medial, lateral and anterior aspect, no bony abnormality or deformity no palpable joint effusion ROM limited secondary to pain.   Neurological: She is alert and oriented to person, place, and time.  Skin: Skin is warm and dry.  No erythema or swelling. No sign of septic joint.   Psychiatric: She has a normal mood and affect. Her behavior is normal.  Nursing note and vitals reviewed.   ED Course  Procedures (including critical care time) DIAGNOSTIC STUDIES: Oxygen Saturation is 99% on RA, normal by my interpretation.    COORDINATION OF CARE: 6:51 PM-Discussed treatment plan with pt at bedside and pt agreed to plan.     Labs Review Labs Reviewed - No data to display  Imaging Review Dg Knee Complete 4 Views Right  09/26/2014   CLINICAL DATA:  Right knee pain and locking for 10 days.  EXAM: RIGHT KNEE - COMPLETE 4+ VIEW  COMPARISON:  None.  FINDINGS: There is no evidence of fracture, dislocation, or joint effusion. Early degenerative spurring of the patella noted. No other signs of arthropathy or other focal bone abnormality. Soft tissues are unremarkable.  IMPRESSION: No acute findings.  Early degenerative spurring of the patella.   Electronically Signed   By: Earle Gell M.D.   On: 09/26/2014 19:50     EKG Interpretation None      MDM   Final diagnoses:  Knee pain    Patient with right knee pain.  Plain films remarkable for early patella degenerative spurring.  Will give knee sleeve and recommend ortho follow-up.  I personally performed the services described in this documentation, which was scribed in my presence. The recorded information has  been reviewed and is accurate.      Montine Circle, PA-C 09/26/14 2004  Lajean Saver, MD 10/01/14 [phone]

## 2014-09-26 NOTE — ED Notes (Signed)
Pt c/o rt knee pain x 1 week.

## 2014-09-26 NOTE — Discharge Instructions (Signed)

## 2014-09-29 ENCOUNTER — Emergency Department (HOSPITAL_COMMUNITY)
Admission: EM | Admit: 2014-09-29 | Discharge: 2014-09-29 | Disposition: A | Discharge status: Home / Self Care | Payer: Medicaid Other | Attending: Emergency Medicine | Admitting: Emergency Medicine

## 2014-09-29 ENCOUNTER — Encounter (HOSPITAL_COMMUNITY): Payer: Self-pay

## 2014-09-29 DIAGNOSIS — E119 Type 2 diabetes mellitus without complications: Secondary | ICD-10-CM | POA: Diagnosis not present

## 2014-09-29 DIAGNOSIS — E669 Obesity, unspecified: Secondary | ICD-10-CM | POA: Insufficient documentation

## 2014-09-29 DIAGNOSIS — Z79899 Other long term (current) drug therapy: Secondary | ICD-10-CM | POA: Insufficient documentation

## 2014-09-29 DIAGNOSIS — G40909 Epilepsy, unspecified, not intractable, without status epilepticus: Secondary | ICD-10-CM | POA: Insufficient documentation

## 2014-09-29 DIAGNOSIS — M25561 Pain in right knee: Secondary | ICD-10-CM | POA: Insufficient documentation

## 2014-09-29 DIAGNOSIS — G43909 Migraine, unspecified, not intractable, without status migrainosus: Secondary | ICD-10-CM | POA: Diagnosis not present

## 2014-09-29 DIAGNOSIS — Z8659 Personal history of other mental and behavioral disorders: Secondary | ICD-10-CM | POA: Insufficient documentation

## 2014-09-29 DIAGNOSIS — Z85818 Personal history of malignant neoplasm of other sites of lip, oral cavity, and pharynx: Secondary | ICD-10-CM | POA: Diagnosis not present

## 2014-09-29 DIAGNOSIS — J449 Chronic obstructive pulmonary disease, unspecified: Secondary | ICD-10-CM | POA: Insufficient documentation

## 2014-09-29 DIAGNOSIS — Z794 Long term (current) use of insulin: Secondary | ICD-10-CM | POA: Diagnosis not present

## 2014-09-29 DIAGNOSIS — I10 Essential (primary) hypertension: Secondary | ICD-10-CM | POA: Insufficient documentation

## 2014-09-29 DIAGNOSIS — Z880 Allergy status to penicillin: Secondary | ICD-10-CM | POA: Diagnosis not present

## 2014-09-29 DIAGNOSIS — I2510 Atherosclerotic heart disease of native coronary artery without angina pectoris: Secondary | ICD-10-CM | POA: Diagnosis not present

## 2014-09-29 DIAGNOSIS — Z720 Tobacco use: Secondary | ICD-10-CM | POA: Insufficient documentation

## 2014-09-29 MED ORDER — TRAMADOL HCL 50 MG PO TABS: 50.0000 mg | ORAL_TABLET | Freq: Four times a day (QID) | ORAL | Status: DC | PRN

## 2014-09-29 NOTE — ED Notes (Signed)
Ortho paged at [facility] request to ask about other knee device options.

## 2014-09-29 NOTE — ED Provider Notes (Signed)
History  This chart was scribed for non-physician practitioner, Hyman Bible, PA-C,working with Lacretia Leigh, MD, by Marlowe Kays, ED Scribe. This patient was seen in room TR08C/TR08C and the patient's care was started at 9:15 PM.  Chief Complaint  Patient presents with  . Knee Pain   The history is provided by the patient and medical records. No language interpreter was used.    HPI Comments:  Carol Hughes is a 49 y.o. female who presents to the Emergency Department complaining of worsening severe sharp, shooting right knee pain that began approximately 1.5 weeks ago. She was seen here three days ago and was given a knee sleeve and advised to follow up with orthopedics in which she has not done yet. She reports taking Ibuprofen, Tylenol and applying ice with no relief of the pain. Flexion of the right leg makes the pain worse and she reports she is unable to flex the knee fully.She denies alleviating factors. Denies numbness, tingling or weakness of the right leg, bruising, wounds, fever or chills.  Past Medical History  Diagnosis Date  . Hypertension   . Seizures   . COPD (chronic obstructive pulmonary disease)   . Diabetes mellitus without complication   . Sleep apnea   . Coronary artery disease   . Schizophrenia   . Migraine   . Addiction to drug   . Obesity   . Cancer throat  . Throat cancer    Past Surgical History  Procedure Laterality Date  . Abdominal hysterectomy    . Appendectomy    . Cholecystectomy    . Eye surgery    . Cesarean section    . Knee surgery     Family History  Problem Relation Age of Onset  . Cancer Father    History  Substance Use Topics  . Smoking status: Current Every Day Smoker -- 0.50 packs/day for 30 years    Types: Cigarettes  . Smokeless tobacco: Never Used  . Alcohol Use: No   OB History    No data available     Review of Systems  Constitutional: Negative for fever and chills.  Gastrointestinal: Negative for nausea  and vomiting.  Musculoskeletal: Positive for arthralgias.  Skin: Negative for color change and wound.  Neurological: Negative for weakness and numbness.    Allergies  Aspirin; Bee venom; Other; Toradol; Tramadol; Penicillins; and Vancomycin  Home Medications   Prior to Admission medications   Medication Sig Start Date End Date Taking? Authorizing Provider  albuterol (PROVENTIL HFA;VENTOLIN HFA) 108 (90 BASE) MCG/ACT inhaler Inhale 2 puffs into the lungs every 6 (six) hours as needed. Shortness of breath 10/27/10   Historical Provider, MD  citalopram (CELEXA) 40 MG tablet Take 40 mg by mouth at bedtime.    Historical Provider, MD  cyclobenzaprine (FLEXERIL) 10 MG tablet Take 1 tablet (10 mg total) by mouth 3 (three) times daily as needed for muscle spasms. Patient not taking: Reported on 08/03/2014 12/15/13   Clayton Bibles, PA-C  cyclobenzaprine (FLEXERIL) 10 MG tablet Take 1 tablet (10 mg total) by mouth 3 (three) times daily as needed for muscle spasms (or pain). Patient not taking: Reported on 08/03/2014 01/15/14   Clayton Bibles, PA-C  divalproex (DEPAKOTE ER) 500 MG 24 hr tablet Take 2 tablets (1,000 mg total) by mouth daily. Patient taking differently: Take 500 mg by mouth 2 (two) times daily.  10/12/13   Elyn Peers, MD  gabapentin (NEURONTIN) 300 MG capsule Take 300 mg by mouth at bedtime.  Historical Provider, MD  insulin glargine (LANTUS) 100 UNIT/ML injection Inject 10 Units into the skin at bedtime.    Historical Provider, MD  lisinopril-hydrochlorothiazide (PRINZIDE,ZESTORETIC) 20-25 MG per tablet Take 1 tablet by mouth every morning.    Historical Provider, MD  LORazepam (ATIVAN) 1 MG tablet Take 0.5-1 mg by mouth every 8 (eight) hours as needed. For anxiety.    Historical Provider, MD  metFORMIN (GLUCOPHAGE) 500 MG tablet Take 500 mg by mouth 2 (two) times daily with a meal.     Historical Provider, MD  nitroGLYCERIN (NITROSTAT) 0.4 MG SL tablet Place 0.4 mg under the tongue every 5  (five) minutes as needed for chest pain.    Historical Provider, MD   Triage Vitals: BP 137/71 mmHg  Pulse 67  Temp(Src) 98.7 F (37.1 C) (Oral)  Resp 16  Ht 5\' 2"  (1.575 m)  Wt 217 lb (98.431 kg)  BMI 39.68 kg/m2  SpO2 99% Physical Exam  Constitutional: She is oriented to person, place, and time. She appears well-developed and well-nourished.  HENT:  Head: Normocephalic and atraumatic.  Eyes: EOM are normal.  Neck: Normal range of motion.  Cardiovascular: Normal rate, regular rhythm and normal heart sounds.   No murmur heard. Pulses:      Dorsalis pedis pulses are 2+ on the right side.  Pulmonary/Chest: Effort normal and breath sounds normal. No respiratory distress.  Musculoskeletal: Normal range of motion. She exhibits tenderness. She exhibits no edema.  Right knee with no erythema, edema or deformity. ROM of right knee limited secondary to pain.  Neurological: She is alert and oriented to person, place, and time.  Skin: Skin is warm and dry. No erythema.  Psychiatric: She has a normal mood and affect. Her behavior is normal.  Nursing note and vitals reviewed.   ED Course  Procedures (including critical care time) DIAGNOSTIC STUDIES: Oxygen Saturation is 99% on RA, normal by my interpretation.   COORDINATION OF CARE: 9:21 PM- Will prescribe pain medication and encouraged pt to follow up with the orthopedist she was previously referred to. Pt verbalizes understanding and agrees to plan.  Medications - No data to display  Labs Review Labs Reviewed - No data to display  Imaging Review No results found.   EKG Interpretation None      MDM   Final diagnoses:  None   Patient presents today with knee pain.  She was seen in the ED three days ago for the same and had an xray showing degenerative spurring of the patella.  She denies any new injury or trauma.  No signs of septic joint.  Patient was given knee sleeve at her last visit and referred to Orthopedics.   Feel  that the pt is stable for discharge.  Return precautions given.  I personally performed the services described in this documentation, which was scribed in my presence. The recorded information has been reviewed and is accurate.    Hyman Bible, PA-C 09/29/14 2251  Lacretia Leigh, MD 09/30/14 [phone]

## 2014-09-29 NOTE — ED Notes (Signed)
Pt seen in ED 09-26-14 for right knee pain that started one week prior.  Knee would lock up.  Xray showed degenerative patella spurring.  Was told to do ice packs, Ibuprofen, Tylenol.  Pt is getting no relief in pain.  Pt tearful at triage.  Pt is in wheelchair and states the right knee is in locked position now.

## 2014-09-30 ENCOUNTER — Telehealth: Payer: Self-pay | Admitting: *Deleted

## 2014-09-30 NOTE — Telephone Encounter (Signed)
Spoke with Lacretia Leigh, MD who was the original MD assigned to the case when [facility] was seen for "knee pain" on 09/29/2014.  MD states pt will need to be reevaluated in the ED in order to have more pain meds prescribed.

## 2014-10-05 ENCOUNTER — Encounter (HOSPITAL_COMMUNITY): Payer: Self-pay | Admitting: Emergency Medicine

## 2014-10-05 ENCOUNTER — Emergency Department (HOSPITAL_COMMUNITY)
Admission: EM | Admit: 2014-10-05 | Discharge: 2014-10-06 | Disposition: A | Discharge status: Home / Self Care | Payer: Medicaid Other | Attending: Emergency Medicine | Admitting: Emergency Medicine

## 2014-10-05 ENCOUNTER — Emergency Department (HOSPITAL_COMMUNITY): Payer: Medicaid Other

## 2014-10-05 DIAGNOSIS — J449 Chronic obstructive pulmonary disease, unspecified: Secondary | ICD-10-CM | POA: Diagnosis not present

## 2014-10-05 DIAGNOSIS — Z3202 Encounter for pregnancy test, result negative: Secondary | ICD-10-CM | POA: Insufficient documentation

## 2014-10-05 DIAGNOSIS — E669 Obesity, unspecified: Secondary | ICD-10-CM | POA: Insufficient documentation

## 2014-10-05 DIAGNOSIS — Z85818 Personal history of malignant neoplasm of other sites of lip, oral cavity, and pharynx: Secondary | ICD-10-CM | POA: Insufficient documentation

## 2014-10-05 DIAGNOSIS — F209 Schizophrenia, unspecified: Secondary | ICD-10-CM | POA: Diagnosis not present

## 2014-10-05 DIAGNOSIS — G43909 Migraine, unspecified, not intractable, without status migrainosus: Secondary | ICD-10-CM | POA: Diagnosis not present

## 2014-10-05 DIAGNOSIS — G40909 Epilepsy, unspecified, not intractable, without status epilepticus: Secondary | ICD-10-CM | POA: Diagnosis present

## 2014-10-05 DIAGNOSIS — Z880 Allergy status to penicillin: Secondary | ICD-10-CM | POA: Diagnosis not present

## 2014-10-05 DIAGNOSIS — I10 Essential (primary) hypertension: Secondary | ICD-10-CM | POA: Insufficient documentation

## 2014-10-05 DIAGNOSIS — R519 Headache, unspecified: Secondary | ICD-10-CM

## 2014-10-05 DIAGNOSIS — I2510 Atherosclerotic heart disease of native coronary artery without angina pectoris: Secondary | ICD-10-CM | POA: Diagnosis not present

## 2014-10-05 DIAGNOSIS — Z720 Tobacco use: Secondary | ICD-10-CM | POA: Insufficient documentation

## 2014-10-05 DIAGNOSIS — Z79899 Other long term (current) drug therapy: Secondary | ICD-10-CM | POA: Diagnosis not present

## 2014-10-05 DIAGNOSIS — E119 Type 2 diabetes mellitus without complications: Secondary | ICD-10-CM | POA: Insufficient documentation

## 2014-10-05 DIAGNOSIS — Z794 Long term (current) use of insulin: Secondary | ICD-10-CM | POA: Diagnosis not present

## 2014-10-05 DIAGNOSIS — R51 Headache: Secondary | ICD-10-CM

## 2014-10-05 LAB — I-STAT BETA HCG BLOOD, ED (MC, WL, AP ONLY): I-stat hCG, quantitative: <5 m[IU]/mL (ref <5)

## 2014-10-05 LAB — VALPROIC ACID LEVEL: Valproic Acid Lvl: <10 ug/mL — ABNORMAL LOW (ref 50.0–100.0)

## 2014-10-05 LAB — CBG MONITORING, ED: GLUCOSE-CAPILLARY: 142 mg/dL — AB (ref 65–99)

## 2014-10-05 MED ORDER — ONDANSETRON HCL 4 MG/2ML IJ SOLN
4.0000 mg | Freq: Once | INTRAMUSCULAR | Status: AC
  Administered 2014-10-06: 4 mg via INTRAVENOUS
  Filled 2014-10-05: qty 2

## 2014-10-05 MED ORDER — GABAPENTIN 300 MG PO CAPS
300.0000 mg | ORAL_CAPSULE | Freq: Every day | ORAL | Status: DC
  Administered 2014-10-06: 300 mg via ORAL
  Filled 2014-10-05: qty 1

## 2014-10-05 MED ORDER — SODIUM CHLORIDE 0.9 % IV BOLUS (SEPSIS)
1000.0000 mL | Freq: Once | INTRAVENOUS | Status: AC
  Administered 2014-10-06: 1000 mL via INTRAVENOUS

## 2014-10-05 MED ORDER — ACETAMINOPHEN 325 MG PO TABS
650.0000 mg | ORAL_TABLET | Freq: Once | ORAL | Status: AC
  Administered 2014-10-06: 650 mg via ORAL
  Filled 2014-10-05: qty 2

## 2014-10-05 MED ORDER — DIVALPROEX SODIUM ER 500 MG PO TB24
1000.0000 mg | ORAL_TABLET | Freq: Every day | ORAL | Status: DC
  Administered 2014-10-06: 1000 mg via ORAL
  Filled 2014-10-05: qty 2

## 2014-10-05 NOTE — ED Notes (Signed)
Per EMS [place], per bystanders pt began having shaking episode, pt remained standing. Pt repeatedly saying she is confused. No tongue trauma, no urinary incontinence noted.  Razor knife removed from patient, locked in security office.

## 2014-10-05 NOTE — ED Notes (Signed)
Bed: RESB Expected date:  Expected time:  Means of arrival:  Comments: EMS ? seizure

## 2014-10-05 NOTE — Progress Notes (Signed)
CSW was notified by nurse to speak with pt due to being homeless.  CSW met with pt at bedside. There was no family present. Patient confirms that she lives in [place]. Patient states [place]. She states that she has her own tent and that her son has a tent behind her.  Patient informed CSW that she completes her ADL's independently. Patient states that she falls often due to having a bad knee.  Patient informed CSW that she receives SSI as a form of income.   Patient informed CSW that her head is hurting. CSW made nurse aware.  Willette Brace [phone] ED CSW 10/05/2014 10:53 PM

## 2014-10-05 NOTE — ED Notes (Signed)
Pt states she moved out of her boyfriends home 2 weeks ago and they will not return her medication

## 2014-10-06 LAB — RAPID URINE DRUG SCREEN, HOSP PERFORMED
Amphetamines: NOT DETECTED
BARBITURATES: NOT DETECTED
BENZODIAZEPINES: NOT DETECTED
Cocaine: NOT DETECTED
Opiates: NOT DETECTED
Tetrahydrocannabinol: NOT DETECTED

## 2014-10-06 LAB — BASIC METABOLIC PANEL
Anion gap: 7 (ref 5–15)
BUN: 16 mg/dL (ref 6–20)
CO2: 28 mmol/L (ref 22–32)
Calcium: 9.7 mg/dL (ref 8.9–10.3)
Chloride: 104 mmol/L (ref 101–111)
Creatinine, Ser: 0.7 mg/dL (ref 0.44–1.00)
GFR calc Af Amer: >60 mL/min (ref >60)
GFR calc non Af Amer: >60 mL/min (ref >60)
GLUCOSE: 166 mg/dL — AB (ref 65–99)
POTASSIUM: 3.8 mmol/L (ref 3.5–5.1)
SODIUM: 139 mmol/L (ref 135–145)

## 2014-10-06 LAB — CBC WITH DIFFERENTIAL/PLATELET
BASOS ABS: 0 10*3/uL (ref 0.0–0.1)
BASOS PCT: 0 % (ref 0–1)
EOS PCT: 1 % (ref 0–5)
Eosinophils Absolute: 0.1 10*3/uL (ref 0.0–0.7)
HCT: 41.8 % (ref 36.0–46.0)
Hemoglobin: 13.7 g/dL (ref 12.0–15.0)
LYMPHS ABS: 4.7 10*3/uL — AB (ref 0.7–4.0)
Lymphocytes Relative: 47 % — ABNORMAL HIGH (ref 12–46)
MCH: 30.6 pg (ref 26.0–34.0)
MCHC: 32.8 g/dL (ref 30.0–36.0)
MCV: 93.3 fL (ref 78.0–100.0)
Monocytes Absolute: 0.8 10*3/uL (ref 0.1–1.0)
Monocytes Relative: 8 % (ref 3–12)
Neutro Abs: 4.4 10*3/uL (ref 1.7–7.7)
Neutrophils Relative %: 44 % (ref 43–77)
PLATELETS: 236 10*3/uL (ref 150–400)
RBC: 4.48 MIL/uL (ref 3.87–5.11)
RDW: 13 % (ref 11.5–15.5)
WBC: 10 10*3/uL (ref 4.0–10.5)

## 2014-10-06 LAB — URINALYSIS, ROUTINE W REFLEX MICROSCOPIC
Bilirubin Urine: NEGATIVE
Glucose, UA: NEGATIVE mg/dL
KETONES UR: NEGATIVE mg/dL
LEUKOCYTES UA: NEGATIVE
Nitrite: NEGATIVE
PROTEIN: NEGATIVE mg/dL
Specific Gravity, Urine: 1.031 — ABNORMAL HIGH (ref 1.005–1.030)
Urobilinogen, UA: 0.2 mg/dL (ref 0.0–1.0)
pH: 5.5 (ref 5.0–8.0)

## 2014-10-06 LAB — ETHANOL: Alcohol, Ethyl (B): <5 mg/dL (ref <5)

## 2014-10-06 LAB — URINE MICROSCOPIC-ADD ON

## 2014-10-06 MED ORDER — DIVALPROEX SODIUM ER 500 MG PO TB24: 500.0000 mg | ORAL_TABLET | Freq: Two times a day (BID) | ORAL | Status: DC

## 2014-10-06 MED ORDER — METFORMIN HCL 500 MG PO TABS: 500.0000 mg | ORAL_TABLET | Freq: Two times a day (BID) | ORAL | Status: DC

## 2014-10-06 NOTE — ED Notes (Signed)
No further seizure activity noted  

## 2014-10-06 NOTE — Discharge Instructions (Signed)
Epilepsy °Epilepsy is a disorder in which a person has repeated seizures over time. A seizure is a release of abnormal electrical activity in the brain. Seizures can cause a change in attention, behavior, or the ability to remain awake and alert (altered mental status). Seizures often involve uncontrollable shaking (convulsions).  °Most people with epilepsy lead normal lives. However, people with epilepsy are at an increased risk of falls, accidents, and injuries. Therefore, it is important to begin treatment right away. °CAUSES  °Epilepsy has many possible causes. Anything that disturbs the normal pattern of brain cell activity can lead to seizures. This may include:  °· Head injury. °· Birth trauma. °· High fever as a child. °· Stroke. °· Bleeding into or around the brain. °· Certain drugs. °· Prolonged low oxygen, such as what occurs after CPR efforts. °· Abnormal brain development. °· Certain illnesses, such as meningitis, encephalitis (brain infection), malaria, and other infections. °· An imbalance of nerve signaling chemicals (neurotransmitters).   °SIGNS AND SYMPTOMS  °The symptoms of a seizure can vary greatly from one person to another. Right before a seizure, you may have a warning (aura) that a seizure is about to occur. An aura may include the following symptoms: °· Fear or anxiety. °· Nausea. °· Feeling like the room is spinning (vertigo). °· Vision changes, such as seeing flashing lights or spots. °Common symptoms during a seizure include: °· Abnormal sensations, such as an abnormal smell or a bitter taste in the mouth.   °· Sudden, general body stiffness.   °· Convulsions that involve rhythmic jerking of the face, arm, or leg on one or both sides.   °· Sudden change in consciousness.   °· Appearing to be awake but not responding.   °· Appearing to be asleep but cannot be awakened.   °· Grimacing, chewing, lip smacking, drooling, tongue biting, or loss of bowel or bladder control. °After a seizure,  you may feel sleepy for a while.  °DIAGNOSIS  °Your health care provider will ask about your symptoms and take a medical history. Descriptions from any witnesses to your seizures will be very helpful in the diagnosis. A physical exam, including a detailed neurological exam, is necessary. Various tests may be done, such as:  °· An electroencephalogram (EEG). This is a painless test of your brain waves. In this test, a diagram is created of your brain waves. These diagrams can be interpreted by a specialist. °· An MRI of the brain.   °· A CT scan of the brain.   °· A spinal tap (lumbar puncture, LP). °· Blood tests to check for signs of infection or abnormal blood chemistry. °TREATMENT  °There is no cure for epilepsy, but it is generally treatable. Once epilepsy is diagnosed, it is important to begin treatment as soon as possible. For most people with epilepsy, seizures can be controlled with medicines. The following may also be used: °· A pacemaker for the brain (vagus nerve stimulator) can be used for people with seizures that are not well controlled by medicine. °· Surgery on the brain. °For some people, epilepsy eventually goes away. °HOME CARE INSTRUCTIONS  °· Follow your health care provider's recommendations on driving and safety in normal activities. °· Get enough rest. Lack of sleep can cause seizures. °· Only take over-the-counter or prescription medicines as directed by your health care provider. Take any prescribed medicine exactly as directed. °· Avoid any known triggers of your seizures. °· Keep a seizure diary. Record what you recall about any seizure, especially any possible trigger.   °· Make   sure the people you live and work with know that you are prone to seizures. They should receive instructions on how to help you. In general, a witness to a seizure should:   Cushion your head and body.   Turn you on your side.   Avoid unnecessarily restraining you.   Not place anything inside your  mouth.   Call for emergency medical help if there is any question about what has occurred.   Follow up with your health care provider as directed. You may need regular blood tests to monitor the levels of your medicine.  SEEK MEDICAL CARE IF:   You develop signs of infection or other illness. This might increase the risk of a seizure.   You seem to be having more frequent seizures.   Your seizure pattern is changing.  SEEK IMMEDIATE MEDICAL CARE IF:   You have a seizure that does not stop after a few moments.   You have a seizure that causes any difficulty in breathing.   You have a seizure that results in a very severe headache.   You have a seizure that leaves you with the inability to speak or use a part of your body.  Document Released: 03/09/2005 Document Revised: 12/28/2012 Document Reviewed: 10/19/2012 [facility] Patient Information 2015 [place], [place]. This information is not intended to replace advice given to you by your health care provider. Make sure you discuss any questions you have with your health care provider.  General Headache Without Cause A headache is pain or discomfort felt around the head or neck area. The specific cause of a headache may not be found. There are many causes and types of headaches. A few common ones are:  Tension headaches.  Migraine headaches.  Cluster headaches.  Chronic daily headaches. HOME CARE INSTRUCTIONS   Keep all follow-up appointments with your caregiver or any specialist referral.  Only take over-the-counter or prescription medicines for pain or discomfort as directed by your caregiver.  Lie down in a dark, quiet room when you have a headache.  Keep a headache journal to find out what may trigger your migraine headaches. For example, write down:  What you eat and drink.  How much sleep you get.  Any change to your diet or medicines.  Try massage or other relaxation techniques.  Put ice packs or heat on  the head and neck. Use these 3 to 4 times per day for 15 to 20 minutes each time, or as needed.  Limit stress.  Sit up straight, and do not tense your muscles.  Quit smoking if you smoke.  Limit alcohol use.  Decrease the amount of caffeine you drink, or stop drinking caffeine.  Eat and sleep on a regular schedule.  Get 7 to 9 hours of sleep, or as recommended by your caregiver.  Keep lights dim if bright lights bother you and make your headaches worse. SEEK MEDICAL CARE IF:   You have problems with the medicines you were prescribed.  Your medicines are not working.  You have a change from the usual headache.  You have nausea or vomiting. SEEK IMMEDIATE MEDICAL CARE IF:   Your headache becomes severe.  You have a fever.  You have a stiff neck.  You have loss of vision.  You have muscular weakness or loss of muscle control.  You start losing your balance or have trouble walking.  You feel faint or pass out.  You have severe symptoms that are different from your first symptoms.  MAKE SURE YOU:   Understand these instructions.  Will watch your condition.  Will get help right away if you are not doing well or get worse. Document Released: 03/09/2005 Document Revised: 06/01/2011 Document Reviewed: 03/25/2011 [facility] Patient Information 2015 [place], [place]. This information is not intended to replace advice given to you by your health care provider. Make sure you discuss any questions you have with your health care provider.

## 2014-10-06 NOTE — ED Notes (Signed)
Pt refused to take Tylenol. Pt now very angry we are not giving her narcotics for pain, attempts made to explain to patient narcotics might further her AMS if she did have seizure. Pt states he wants IV out and wants to be discharged. Pt states she is going back to [facility], the will give her "shots". Pt also stating when she receives her survey she will be giving [place] BAD BAD BAD. Dr. Sharol Given aware.

## 2014-10-06 NOTE — ED Provider Notes (Signed)
CSN: [id number]     Arrival date & time 10/05/14  2052 History   First MD Initiated Contact with Patient 10/05/14 2307     Chief Complaint  Patient presents with  . Seizures     (Consider location/radiation/quality/duration/timing/severity/associated sxs/prior Treatment) HPI 49 yo female presents to the ER via EMS after possible seizure.  Pt has been out of her medications for the last 2 weeks due to inability to access them.  She reports that she has spoken with the police and will be able to get into her boyfriends house tomorrow to get her meds back.  Patient reports that she developed a headache earlier in the day with nausea.  She reports she has a history of migraines, but this headache was worse than usual, with a feeling of water in her left ear.  No fevers or chills.  Bystanders report that patient began shaking, but was able to remain standing.  Tonic-clonic activity.  She seemed weak and she required people to hold her up.  Patient is now back to baseline, but reports that headache has continued. Past Medical History  Diagnosis Date  . Hypertension   . Seizures   . COPD (chronic obstructive pulmonary disease)   . Diabetes mellitus without complication   . Sleep apnea   . Coronary artery disease   . Schizophrenia   . Migraine   . Addiction to drug   . Obesity   . Cancer throat  . Throat cancer    Past Surgical History  Procedure Laterality Date  . Abdominal hysterectomy    . Appendectomy    . Cholecystectomy    . Eye surgery    . Cesarean section    . Knee surgery     Family History  Problem Relation Age of Onset  . Cancer Father    History  Substance Use Topics  . Smoking status: Current Every Day Smoker -- 0.50 packs/day for 30 years    Types: Cigarettes  . Smokeless tobacco: Never Used  . Alcohol Use: No   OB History    No data available     Review of Systems  See History of Present Illness; otherwise all other systems are reviewed and  negative   Allergies  Aspirin; Bee venom; Other; Toradol; Tramadol; Penicillins; and Vancomycin  Home Medications   Prior to Admission medications   Medication Sig Start Date End Date Taking? Authorizing Provider  metFORMIN (GLUCOPHAGE) 500 MG tablet Take 500 mg by mouth 2 (two) times daily with a meal.    Yes Historical Provider, MD  albuterol (PROVENTIL HFA;VENTOLIN HFA) 108 (90 BASE) MCG/ACT inhaler Inhale 2 puffs into the lungs every 6 (six) hours as needed. Shortness of breath 10/27/10   Historical Provider, MD  citalopram (CELEXA) 40 MG tablet Take 40 mg by mouth at bedtime.    Historical Provider, MD  cyclobenzaprine (FLEXERIL) 10 MG tablet Take 1 tablet (10 mg total) by mouth 3 (three) times daily as needed for muscle spasms. Patient not taking: Reported on 08/03/2014 12/15/13   Clayton Bibles, PA-C  cyclobenzaprine (FLEXERIL) 10 MG tablet Take 1 tablet (10 mg total) by mouth 3 (three) times daily as needed for muscle spasms (or pain). Patient not taking: Reported on 08/03/2014 01/15/14   Clayton Bibles, PA-C  divalproex (DEPAKOTE ER) 500 MG 24 hr tablet Take 2 tablets (1,000 mg total) by mouth daily. Patient taking differently: Take 500 mg by mouth 2 (two) times daily.  10/12/13   Elyn Peers, MD  gabapentin (NEURONTIN) 300 MG capsule Take 300 mg by mouth at bedtime.     Historical Provider, MD  insulin glargine (LANTUS) 100 UNIT/ML injection Inject 10 Units into the skin at bedtime.    Historical Provider, MD  lisinopril-hydrochlorothiazide (PRINZIDE,ZESTORETIC) 20-25 MG per tablet Take 1 tablet by mouth every morning.    Historical Provider, MD  LORazepam (ATIVAN) 1 MG tablet Take 0.5-1 mg by mouth every 8 (eight) hours as needed. For anxiety.    Historical Provider, MD  nitroGLYCERIN (NITROSTAT) 0.4 MG SL tablet Place 0.4 mg under the tongue every 5 (five) minutes as needed for chest pain.    Historical Provider, MD  traMADol (ULTRAM) 50 MG tablet Take 1 tablet (50 mg total) by mouth every 6  (six) hours as needed. Patient not taking: Reported on 10/05/2014 09/29/14   Heather Laisure, PA-C   BP 146/78 mmHg  Pulse 55  Temp(Src) 98.1 F (36.7 C) (Oral)  Resp 14  SpO2 98% Physical Exam  Constitutional: She is oriented to person, place, and time. She appears well-developed and well-nourished.  HENT:  Head: Normocephalic and atraumatic.  Right Ear: External ear normal.  Nose: Nose normal.  Mouth/Throat: Oropharynx is clear and moist.  Eyes: Conjunctivae and EOM are normal. Pupils are equal, round, and reactive to light.  Neck: Normal range of motion. Neck supple. No JVD present. No tracheal deviation present. No thyromegaly present.  Cardiovascular: Normal rate, regular rhythm, normal heart sounds and intact distal pulses.  Exam reveals no gallop and no friction rub.   No murmur heard. Pulmonary/Chest: Effort normal and breath sounds normal. No stridor. No respiratory distress. She has no wheezes. She has no rales. She exhibits no tenderness.  Abdominal: Soft. Bowel sounds are normal. She exhibits no distension and no mass. There is no tenderness. There is no rebound and no guarding.  Musculoskeletal: Normal range of motion. She exhibits no edema or tenderness.  Lymphadenopathy:    She has no cervical adenopathy.  Neurological: She is alert and oriented to person, place, and time. She displays normal reflexes. No cranial nerve deficit. She exhibits normal muscle tone. Coordination normal.  Skin: Skin is warm and dry. No rash noted. No erythema. No pallor.  Psychiatric: She has a normal mood and affect. Her behavior is normal. Judgment and thought content normal.  Nursing note and vitals reviewed.   ED Course  Procedures (including critical care time) Labs Review Labs Reviewed  VALPROIC ACID LEVEL - Abnormal; Notable for the following:    Valproic Acid Lvl <10 (*)    All other components within normal limits  CBC WITH DIFFERENTIAL/PLATELET - Abnormal; Notable for the  following:    Lymphocytes Relative 47 (*)    Lymphs Abs 4.7 (*)    All other components within normal limits  BASIC METABOLIC PANEL - Abnormal; Notable for the following:    Glucose, Bld 166 (*)    All other components within normal limits  URINALYSIS, ROUTINE W REFLEX MICROSCOPIC (NOT AT [facility]) - Abnormal; Notable for the following:    APPearance CLOUDY (*)    Specific Gravity, Urine 1.031 (*)    Hgb urine dipstick TRACE (*)    All other components within normal limits  URINE MICROSCOPIC-ADD ON - Abnormal; Notable for the following:    Crystals CA OXALATE CRYSTALS (*)    All other components within normal limits  CBG MONITORING, ED - Abnormal; Notable for the following:    Glucose-Capillary 142 (*)    All other components  within normal limits  URINE RAPID DRUG SCREEN, HOSP PERFORMED  ETHANOL  I-STAT BETA HCG BLOOD, ED ([facility], [facility], [facility] ONLY)  CBG MONITORING, ED    Imaging Review Ct Head Wo Contrast  10/05/2014   CLINICAL DATA:  [place] episode at park, confusion. History of schizophrenia, diabetes, seizure, hypertension, throat cancer.  EXAM: CT HEAD WITHOUT CONTRAST  TECHNIQUE: Contiguous axial images were obtained from the base of the skull through the vertex without intravenous contrast.  COMPARISON:  CT head September 17, 2014  FINDINGS: The ventricles and sulci are normal. No intraparenchymal hemorrhage, mass effect nor midline shift. No acute large vascular territory infarcts.  No abnormal extra-axial fluid collections. Basal cisterns are patent. Mild calcific atherosclerosis of the included vertebral arteries.  No skull fracture. The included ocular globes and orbital contents are non-suspicious. The mastoid aircells and included paranasal sinuses are well-aerated. Under pneumatized mastoid air cells. Mild temporomandibular osteoarthrosis.  IMPRESSION: No acute intracranial process, stable appearance of the head from September 17, 2014   Electronically Signed   By: Elon Alas M.D.   On:  10/05/2014 23:52     EKG Interpretation None      MDM   Final diagnoses:  Headache  Seizure disorder    49 year old female with probable seizure having been off her Depakote for 2 weeks.  Patient is a diabetic and has also been off her insulin.  Her glucose is 142.  Head CT unremarkable.  Patient is stable for discharge home.  We'll write her prescriptions for seizure medications.  Pt requests to be discharged, is unhappy about tylenol for her headache.    Linton Flemings, MD 10/06/14 [phone]

## 2014-11-13 ENCOUNTER — Encounter (HOSPITAL_COMMUNITY): Payer: Self-pay | Admitting: *Deleted

## 2014-11-13 ENCOUNTER — Emergency Department (HOSPITAL_COMMUNITY)
Admission: EM | Admit: 2014-11-13 | Discharge: 2014-11-13 | Discharge status: Against Medical Advice | Payer: Medicaid Other | Attending: Emergency Medicine | Admitting: Emergency Medicine

## 2014-11-13 DIAGNOSIS — J449 Chronic obstructive pulmonary disease, unspecified: Secondary | ICD-10-CM | POA: Insufficient documentation

## 2014-11-13 DIAGNOSIS — E669 Obesity, unspecified: Secondary | ICD-10-CM | POA: Diagnosis not present

## 2014-11-13 DIAGNOSIS — Z79899 Other long term (current) drug therapy: Secondary | ICD-10-CM | POA: Diagnosis not present

## 2014-11-13 DIAGNOSIS — G44009 Cluster headache syndrome, unspecified, not intractable: Secondary | ICD-10-CM | POA: Diagnosis not present

## 2014-11-13 DIAGNOSIS — Z794 Long term (current) use of insulin: Secondary | ICD-10-CM | POA: Insufficient documentation

## 2014-11-13 DIAGNOSIS — Z85818 Personal history of malignant neoplasm of other sites of lip, oral cavity, and pharynx: Secondary | ICD-10-CM | POA: Insufficient documentation

## 2014-11-13 DIAGNOSIS — E119 Type 2 diabetes mellitus without complications: Secondary | ICD-10-CM | POA: Insufficient documentation

## 2014-11-13 DIAGNOSIS — G4489 Other headache syndrome: Secondary | ICD-10-CM

## 2014-11-13 DIAGNOSIS — Z880 Allergy status to penicillin: Secondary | ICD-10-CM | POA: Insufficient documentation

## 2014-11-13 DIAGNOSIS — I10 Essential (primary) hypertension: Secondary | ICD-10-CM | POA: Insufficient documentation

## 2014-11-13 DIAGNOSIS — I2510 Atherosclerotic heart disease of native coronary artery without angina pectoris: Secondary | ICD-10-CM | POA: Insufficient documentation

## 2014-11-13 DIAGNOSIS — G43909 Migraine, unspecified, not intractable, without status migrainosus: Secondary | ICD-10-CM | POA: Diagnosis present

## 2014-11-13 DIAGNOSIS — Z720 Tobacco use: Secondary | ICD-10-CM | POA: Insufficient documentation

## 2014-11-13 LAB — CBG MONITORING, ED: Glucose-Capillary: 223 mg/dL — ABNORMAL HIGH (ref 65–99)

## 2014-11-13 MED ORDER — DIPHENHYDRAMINE HCL 50 MG/ML IJ SOLN: 25.0000 mg | Freq: Once | INTRAMUSCULAR | Status: DC

## 2014-11-13 MED ORDER — SODIUM CHLORIDE 0.9 % IV SOLN: INTRAVENOUS | Status: DC

## 2014-11-13 MED ORDER — SODIUM CHLORIDE 0.9 % IV BOLUS (SEPSIS): 2000.0000 mL | Freq: Once | INTRAVENOUS | Status: DC

## 2014-11-13 MED ORDER — LORAZEPAM 2 MG/ML IJ SOLN: 1.0000 mg | Freq: Once | INTRAMUSCULAR | Status: DC

## 2014-11-13 MED ORDER — METOCLOPRAMIDE HCL 5 MG/ML IJ SOLN: 10.0000 mg | Freq: Once | INTRAMUSCULAR | Status: DC

## 2014-11-13 NOTE — ED Notes (Signed)
Bed: WA21 Expected date:  Expected time:  Means of arrival:  Comments: EMS  

## 2014-11-13 NOTE — ED Notes (Signed)
Pt not in room for treatment. Dr Zenia Resides aware.

## 2014-11-13 NOTE — ED Notes (Signed)
CBG 223 

## 2014-11-13 NOTE — ED Notes (Signed)
Per EMS- c/o migraine headache x5 days with light sensitivity. Fire dept reports CBG 230 mg/dl. Had PCP appt today but missed it, has another PCP appt tomorrow. VS: BP 170/90 HR 70.

## 2014-11-13 NOTE — ED Provider Notes (Signed)
CSN: [id number]     Arrival date & time 11/13/14  1220 History   First MD Initiated Contact with Patient 11/13/14 1314     Chief Complaint  Patient presents with  . Migraine     (Consider location/radiation/quality/duration/timing/severity/associated sxs/prior Treatment) HPI Comments: Pt light and sound sensitive--h/o same in past and this is no different Notes n/v, no fever or neck pain   Patient is a 49 y.o. female presenting with migraines. The history is provided by the patient.  Migraine This is a recurrent problem. The problem occurs rarely. The problem has been rapidly worsening. Nothing relieves the symptoms. She has tried acetaminophen and ASA for the symptoms.    Past Medical History  Diagnosis Date  . Hypertension   . Seizures   . COPD (chronic obstructive pulmonary disease)   . Diabetes mellitus without complication   . Sleep apnea   . Coronary artery disease   . Schizophrenia   . Migraine   . Addiction to drug   . Obesity   . Cancer throat  . Throat cancer    Past Surgical History  Procedure Laterality Date  . Abdominal hysterectomy    . Appendectomy    . Cholecystectomy    . Eye surgery    . Cesarean section    . Knee surgery     Family History  Problem Relation Age of Onset  . Cancer Father    Social History  Substance Use Topics  . Smoking status: Current Every Day Smoker -- 0.50 packs/day for 30 years    Types: Cigarettes  . Smokeless tobacco: Never Used  . Alcohol Use: No   OB History    No data available     Review of Systems  All other systems reviewed and are negative.     Allergies  Aspirin; Bee venom; Other; Toradol; Tramadol; Penicillins; and Vancomycin  Home Medications   Prior to Admission medications   Medication Sig Start Date End Date Taking? Authorizing Provider  albuterol (PROVENTIL HFA;VENTOLIN HFA) 108 (90 BASE) MCG/ACT inhaler Inhale 2 puffs into the lungs every 6 (six) hours as needed. Shortness of breath 10/27/10    Historical Provider, MD  citalopram (CELEXA) 40 MG tablet Take 40 mg by mouth at bedtime.    Historical Provider, MD  cyclobenzaprine (FLEXERIL) 10 MG tablet Take 1 tablet (10 mg total) by mouth 3 (three) times daily as needed for muscle spasms. Patient not taking: Reported on 08/03/2014 12/15/13   Clayton Bibles, PA-C  cyclobenzaprine (FLEXERIL) 10 MG tablet Take 1 tablet (10 mg total) by mouth 3 (three) times daily as needed for muscle spasms (or pain). Patient not taking: Reported on 08/03/2014 01/15/14   Clayton Bibles, PA-C  divalproex (DEPAKOTE ER) 500 MG 24 hr tablet Take 1 tablet (500 mg total) by mouth 2 (two) times daily. 10/06/14   Linton Flemings, MD  gabapentin (NEURONTIN) 300 MG capsule Take 300 mg by mouth at bedtime.     Historical Provider, MD  insulin glargine (LANTUS) 100 UNIT/ML injection Inject 10 Units into the skin at bedtime.    Historical Provider, MD  lisinopril-hydrochlorothiazide (PRINZIDE,ZESTORETIC) 20-25 MG per tablet Take 1 tablet by mouth every morning.    Historical Provider, MD  LORazepam (ATIVAN) 1 MG tablet Take 0.5-1 mg by mouth every 8 (eight) hours as needed. For anxiety.    Historical Provider, MD  metFORMIN (GLUCOPHAGE) 500 MG tablet Take 1 tablet (500 mg total) by mouth 2 (two) times daily with a meal. 10/06/14  Linton Flemings, MD  nitroGLYCERIN (NITROSTAT) 0.4 MG SL tablet Place 0.4 mg under the tongue every 5 (five) minutes as needed for chest pain.    Historical Provider, MD  traMADol (ULTRAM) 50 MG tablet Take 1 tablet (50 mg total) by mouth every 6 (six) hours as needed. Patient not taking: Reported on 10/05/2014 09/29/14   Heather Laisure, PA-C   BP 131/67 mmHg  Pulse 64  Temp(Src) 98.5 F (36.9 C) (Oral)  Resp 18  Ht 5\' 2"  (1.575 m)  SpO2 99% Physical Exam  Constitutional: She is oriented to person, place, and time. She appears well-developed and well-nourished.  Non-toxic appearance. No distress.  HENT:  Head: Normocephalic and atraumatic.  Eyes: Conjunctivae,  EOM and lids are normal. Pupils are equal, round, and reactive to light.  Neck: Normal range of motion. Neck supple. No tracheal deviation present. No thyroid mass present.  Cardiovascular: Normal rate, regular rhythm and normal heart sounds.  Exam reveals no gallop.   No murmur heard. Pulmonary/Chest: Effort normal and breath sounds normal. No stridor. No respiratory distress. She has no decreased breath sounds. She has no wheezes. She has no rhonchi. She has no rales.  Abdominal: Soft. Normal appearance and bowel sounds are normal. She exhibits no distension. There is no tenderness. There is no rebound and no CVA tenderness.  Musculoskeletal: Normal range of motion. She exhibits no edema or tenderness.  Neurological: She is alert and oriented to person, place, and time. She has normal strength. No cranial nerve deficit or sensory deficit. GCS eye subscore is 4. GCS verbal subscore is 5. GCS motor subscore is 6.  Skin: Skin is warm and dry. No abrasion and no rash noted.  Psychiatric: She has a normal mood and affect. Her speech is normal and behavior is normal.  Nursing note and vitals reviewed.   ED Course  Procedures (including critical care time) Labs Review Labs Reviewed  CBG MONITORING, ED - Abnormal; Notable for the following:    Glucose-Capillary 223 (*)    All other components within normal limits    Imaging Review No results found. I have personally reviewed and evaluated these images and lab results as part of my medical decision-making.   EKG Interpretation None      MDM   Final diagnoses:  None    Patient offered IV fluids along with medications for migraine. I informed her that she would not be receiving narcotics. Nurse then inform me several minutes later that the patient has eloped    Lacretia Leigh, MD 11/13/14 1422

## 2014-11-13 NOTE — ED Notes (Signed)
Pt not in room for IV Dr Zenia Resides informed.

## 2014-11-13 NOTE — ED Notes (Signed)
Pt has had migraine for last 5 days.  She has tried tylenol, ibuprofen, ibuprofen PM, advil and has gotten no relief. N&V - "vomited all day yesterday, nauseated today"

## 2014-12-09 ENCOUNTER — Encounter (HOSPITAL_COMMUNITY): Payer: Self-pay | Admitting: Nurse Practitioner

## 2014-12-09 ENCOUNTER — Emergency Department (HOSPITAL_COMMUNITY): Payer: Medicaid Other

## 2014-12-09 ENCOUNTER — Emergency Department (HOSPITAL_COMMUNITY)
Admission: EM | Admit: 2014-12-09 | Discharge: 2014-12-09 | Disposition: A | Discharge status: Home / Self Care | Payer: Medicaid Other | Attending: Emergency Medicine | Admitting: Emergency Medicine

## 2014-12-09 DIAGNOSIS — I10 Essential (primary) hypertension: Secondary | ICD-10-CM | POA: Insufficient documentation

## 2014-12-09 DIAGNOSIS — J441 Chronic obstructive pulmonary disease with (acute) exacerbation: Secondary | ICD-10-CM | POA: Insufficient documentation

## 2014-12-09 DIAGNOSIS — Z794 Long term (current) use of insulin: Secondary | ICD-10-CM | POA: Diagnosis not present

## 2014-12-09 DIAGNOSIS — R0602 Shortness of breath: Secondary | ICD-10-CM | POA: Diagnosis present

## 2014-12-09 DIAGNOSIS — Z720 Tobacco use: Secondary | ICD-10-CM | POA: Insufficient documentation

## 2014-12-09 DIAGNOSIS — G43909 Migraine, unspecified, not intractable, without status migrainosus: Secondary | ICD-10-CM | POA: Diagnosis not present

## 2014-12-09 DIAGNOSIS — E119 Type 2 diabetes mellitus without complications: Secondary | ICD-10-CM | POA: Diagnosis not present

## 2014-12-09 DIAGNOSIS — G40909 Epilepsy, unspecified, not intractable, without status epilepticus: Secondary | ICD-10-CM | POA: Diagnosis not present

## 2014-12-09 DIAGNOSIS — Z79899 Other long term (current) drug therapy: Secondary | ICD-10-CM | POA: Diagnosis not present

## 2014-12-09 DIAGNOSIS — Z8659 Personal history of other mental and behavioral disorders: Secondary | ICD-10-CM | POA: Insufficient documentation

## 2014-12-09 DIAGNOSIS — E669 Obesity, unspecified: Secondary | ICD-10-CM | POA: Insufficient documentation

## 2014-12-09 DIAGNOSIS — Z85818 Personal history of malignant neoplasm of other sites of lip, oral cavity, and pharynx: Secondary | ICD-10-CM | POA: Insufficient documentation

## 2014-12-09 DIAGNOSIS — J069 Acute upper respiratory infection, unspecified: Secondary | ICD-10-CM | POA: Diagnosis not present

## 2014-12-09 DIAGNOSIS — I2510 Atherosclerotic heart disease of native coronary artery without angina pectoris: Secondary | ICD-10-CM | POA: Diagnosis not present

## 2014-12-09 DIAGNOSIS — Z880 Allergy status to penicillin: Secondary | ICD-10-CM | POA: Diagnosis not present

## 2014-12-09 DIAGNOSIS — B9789 Other viral agents as the cause of diseases classified elsewhere: Secondary | ICD-10-CM

## 2014-12-09 LAB — BASIC METABOLIC PANEL
ANION GAP: 9 (ref 5–15)
BUN: 6 mg/dL (ref 6–20)
CALCIUM: 9.1 mg/dL (ref 8.9–10.3)
CO2: 29 mmol/L (ref 22–32)
Chloride: 102 mmol/L (ref 101–111)
Creatinine, Ser: 0.77 mg/dL (ref 0.44–1.00)
GFR calc Af Amer: >60 mL/min (ref >60)
GFR calc non Af Amer: >60 mL/min (ref >60)
GLUCOSE: 162 mg/dL — AB (ref 65–99)
Potassium: 3.3 mmol/L — ABNORMAL LOW (ref 3.5–5.1)
Sodium: 140 mmol/L (ref 135–145)

## 2014-12-09 LAB — CBC
HEMATOCRIT: 39.2 % (ref 36.0–46.0)
HEMOGLOBIN: 13.2 g/dL (ref 12.0–15.0)
MCH: 30.9 pg (ref 26.0–34.0)
MCHC: 33.7 g/dL (ref 30.0–36.0)
MCV: 91.8 fL (ref 78.0–100.0)
Platelets: 182 10*3/uL (ref 150–400)
RBC: 4.27 MIL/uL (ref 3.87–5.11)
RDW: 12.5 % (ref 11.5–15.5)
WBC: 7.7 10*3/uL (ref 4.0–10.5)

## 2014-12-09 LAB — I-STAT TROPONIN, ED: TROPONIN I, POC: 0 ng/mL (ref 0.00–0.08)

## 2014-12-09 MED ORDER — CLARITHROMYCIN 500 MG PO TABS: 500.0000 mg | ORAL_TABLET | Freq: Two times a day (BID) | ORAL | Status: DC

## 2014-12-09 MED ORDER — IPRATROPIUM BROMIDE 0.02 % IN SOLN
0.5000 mg | Freq: Once | RESPIRATORY_TRACT | Status: AC
  Administered 2014-12-09: 0.5 mg via RESPIRATORY_TRACT
  Filled 2014-12-09: qty 2.5

## 2014-12-09 MED ORDER — PREDNISONE 10 MG PO TABS: 20.0000 mg | ORAL_TABLET | Freq: Every day | ORAL | Status: DC

## 2014-12-09 MED ORDER — ALBUTEROL (5 MG/ML) CONTINUOUS INHALATION SOLN
15.0000 mg/h | INHALATION_SOLUTION | RESPIRATORY_TRACT | Status: AC
  Administered 2014-12-09: 15 mg/h via RESPIRATORY_TRACT
  Filled 2014-12-09: qty 20

## 2014-12-09 MED ORDER — ACETAMINOPHEN-CODEINE 120-12 MG/5ML PO SOLN: 10.0000 mL | ORAL | Status: DC | PRN

## 2014-12-09 MED ORDER — GUAIFENESIN ER 1200 MG PO TB12: 1.0000 | ORAL_TABLET | Freq: Two times a day (BID) | ORAL | Status: DC

## 2014-12-09 NOTE — ED Notes (Signed)
Respiratory called for neb treatment.

## 2014-12-09 NOTE — ED Notes (Signed)
She reports 3 day history of chest pain and congestion, productive cough, nasal congestion, sweats. She reports some SOB. She had multiple teeth extracted several weeks ago and has had pain and foul taste at the site since and is concerned this may have something to do with her symptoms. She is A&Ox4, resp e/u

## 2014-12-09 NOTE — Discharge Instructions (Signed)
Return here as needed.  Follow-up with your doctor and your dentist increase your fluid intake, rest as much as possible

## 2014-12-09 NOTE — ED Provider Notes (Signed)
CSN: [id number]     Arrival date & time 12/09/14  1404 History   First MD Initiated Contact with Patient 12/09/14 1523     Chief Complaint  Patient presents with  . Chest Pain  . Shortness of Breath     (Consider location/radiation/quality/duration/timing/severity/associated sxs/prior Treatment) HPI Patient presents to the emergency department with cough, shortness of breath, nasal congestion, sore throat, body aches for the last 2 weeks.  The patient states that her chest started to bother her.  Several days ago for multiple coughing and congestion.  She states that she does have COPD and emphysema.  She states she had multiple teeth extracted several weeks ago and feels like that there is some bad tasting material coming out of one of the wounds in her mouth.  Patient denies nausea, vomiting, weakness, dizziness, headache, blurred vision, back pain, neck pain, fever, dysuria, incontinence, hematemesis, bloody stool or syncope. Patient states that nothing seems make her condition better or worse Past Medical History  Diagnosis Date  . Hypertension   . Seizures   . COPD (chronic obstructive pulmonary disease)   . Diabetes mellitus without complication   . Sleep apnea   . Coronary artery disease   . Schizophrenia   . Migraine   . Addiction to drug   . Obesity   . Cancer throat  . Throat cancer    Past Surgical History  Procedure Laterality Date  . Abdominal hysterectomy    . Appendectomy    . Cholecystectomy    . Eye surgery    . Cesarean section    . Knee surgery     Family History  Problem Relation Age of Onset  . Cancer Father    Social History  Substance Use Topics  . Smoking status: Current Every Day Smoker -- 0.50 packs/day for 30 years    Types: Cigarettes  . Smokeless tobacco: Never Used  . Alcohol Use: No   OB History    No data available     Review of Systems  All other systems negative except as documented in the HPI. All pertinent positives and  negatives as reviewed in the HPI.  Allergies  Aspirin; Bee venom; Other; Toradol; Tramadol; Penicillins; and Vancomycin  Home Medications   Prior to Admission medications   Medication Sig Start Date End Date Taking? Authorizing Provider  albuterol (PROVENTIL HFA;VENTOLIN HFA) 108 (90 BASE) MCG/ACT inhaler Inhale 2 puffs into the lungs every 6 (six) hours as needed. Shortness of breath 10/27/10  Yes Historical Provider, MD  citalopram (CELEXA) 40 MG tablet Take 40 mg by mouth at bedtime.   Yes Historical Provider, MD  cyclobenzaprine (FLEXERIL) 10 MG tablet Take 1 tablet (10 mg total) by mouth 3 (three) times daily as needed for muscle spasms. 12/15/13  Yes Clayton Bibles, PA-C  divalproex (DEPAKOTE ER) 500 MG 24 hr tablet Take 1 tablet (500 mg total) by mouth 2 (two) times daily. 10/06/14  Yes Linton Flemings, MD  gabapentin (NEURONTIN) 300 MG capsule Take 300 mg by mouth at bedtime.    Yes Historical Provider, MD  insulin aspart protamine- aspart (NOVOLOG MIX 70/30) (70-30) 100 UNIT/ML injection Inject 10 Units into the skin 2 (two) times daily with a meal.   Yes Historical Provider, MD  insulin glargine (LANTUS) 100 UNIT/ML injection Inject 10 Units into the skin at bedtime.   Yes Historical Provider, MD  lisinopril-hydrochlorothiazide (PRINZIDE,ZESTORETIC) 20-25 MG per tablet Take 1 tablet by mouth every morning.   Yes Historical Provider, MD  LORazepam (ATIVAN) 1 MG tablet Take 0.5-1 mg by mouth every 8 (eight) hours as needed. For anxiety.   Yes Historical Provider, MD  metFORMIN (GLUCOPHAGE) 500 MG tablet Take 1 tablet (500 mg total) by mouth 2 (two) times daily with a meal. 10/06/14  Yes Linton Flemings, MD  nitroGLYCERIN (NITROSTAT) 0.4 MG SL tablet Place 0.4 mg under the tongue every 5 (five) minutes as needed for chest pain.    Historical Provider, MD  traMADol (ULTRAM) 50 MG tablet Take 1 tablet (50 mg total) by mouth every 6 (six) hours as needed. Patient not taking: Reported on 10/05/2014 09/29/14    Heather Laisure, PA-C   BP 128/59 mmHg  Pulse 114  Temp(Src) 98.6 F (37 C) (Oral)  Resp 18  SpO2 96% Physical Exam  Constitutional: She is oriented to person, place, and time. She appears well-developed and well-nourished. No distress.  HENT:  Head: Normocephalic and atraumatic.  Mouth/Throat: Oropharynx is clear and moist. No oropharyngeal exudate.  Eyes: Pupils are equal, round, and reactive to light.  Neck: Normal range of motion. Neck supple.  Cardiovascular: Normal rate, regular rhythm, normal heart sounds and intact distal pulses.  Exam reveals no gallop and no friction rub.   No murmur heard. Pulmonary/Chest: Effort normal. No respiratory distress. She has wheezes. She has rhonchi. She has no rales.  Neurological: She is alert and oriented to person, place, and time.  Skin: Skin is warm and dry. No rash noted. No erythema.  Psychiatric: She has a normal mood and affect. Her behavior is normal.  Nursing note and vitals reviewed.   ED Course  Procedures (including critical care time) Labs Review Labs Reviewed  BASIC METABOLIC PANEL - Abnormal; Notable for the following:    Potassium 3.3 (*)    Glucose, Bld 162 (*)    All other components within normal limits  CBC  I-STAT TROPOININ, ED    Imaging Review Dg Chest 2 View  12/09/2014   CLINICAL DATA:  Productive cough  EXAM: CHEST  2 VIEW  COMPARISON:  08/30/2014  FINDINGS: Cardiomediastinal silhouette is stable. No acute infiltrate or pleural effusion. No pulmonary edema. Degenerative changes thoracic spine.  IMPRESSION: No active cardiopulmonary disease.   Electronically Signed   By: Lahoma Crocker M.D.   On: 12/09/2014 15:05   I have personally reviewed and evaluated these images and lab results as part of my medical decision-making.   EKG Interpretation None      Patient has a negative chest x-ray.  We will give her antibody for her  Dental issue, there is some mild swelling noted to the gums.  Patient is advised of  the results and all questions were answered.  She agrees the plan  Dalia Heading, PA-C 12/09/14 1916  Charlesetta Shanks, MD 12/09/14 [phone]

## 2015-01-21 ENCOUNTER — Emergency Department (HOSPITAL_COMMUNITY): Payer: Medicaid Other

## 2015-01-21 ENCOUNTER — Emergency Department (HOSPITAL_COMMUNITY)
Admission: EM | Admit: 2015-01-21 | Discharge: 2015-01-21 | Disposition: A | Discharge status: Home / Self Care | Payer: Medicaid Other | Attending: Emergency Medicine | Admitting: Emergency Medicine

## 2015-01-21 ENCOUNTER — Encounter (HOSPITAL_COMMUNITY): Payer: Self-pay | Admitting: Emergency Medicine

## 2015-01-21 DIAGNOSIS — Z880 Allergy status to penicillin: Secondary | ICD-10-CM | POA: Diagnosis not present

## 2015-01-21 DIAGNOSIS — G40909 Epilepsy, unspecified, not intractable, without status epilepticus: Secondary | ICD-10-CM | POA: Diagnosis not present

## 2015-01-21 DIAGNOSIS — Z7984 Long term (current) use of oral hypoglycemic drugs: Secondary | ICD-10-CM | POA: Insufficient documentation

## 2015-01-21 DIAGNOSIS — E119 Type 2 diabetes mellitus without complications: Secondary | ICD-10-CM | POA: Diagnosis not present

## 2015-01-21 DIAGNOSIS — H00032 Abscess of right lower eyelid: Secondary | ICD-10-CM | POA: Diagnosis not present

## 2015-01-21 DIAGNOSIS — H269 Unspecified cataract: Secondary | ICD-10-CM | POA: Diagnosis not present

## 2015-01-21 DIAGNOSIS — I2510 Atherosclerotic heart disease of native coronary artery without angina pectoris: Secondary | ICD-10-CM | POA: Diagnosis not present

## 2015-01-21 DIAGNOSIS — J449 Chronic obstructive pulmonary disease, unspecified: Secondary | ICD-10-CM | POA: Insufficient documentation

## 2015-01-21 DIAGNOSIS — Z794 Long term (current) use of insulin: Secondary | ICD-10-CM | POA: Insufficient documentation

## 2015-01-21 DIAGNOSIS — H00012 Hordeolum externum right lower eyelid: Secondary | ICD-10-CM | POA: Diagnosis not present

## 2015-01-21 DIAGNOSIS — Y998 Other external cause status: Secondary | ICD-10-CM | POA: Insufficient documentation

## 2015-01-21 DIAGNOSIS — Z9842 Cataract extraction status, left eye: Secondary | ICD-10-CM | POA: Insufficient documentation

## 2015-01-21 DIAGNOSIS — E669 Obesity, unspecified: Secondary | ICD-10-CM | POA: Insufficient documentation

## 2015-01-21 DIAGNOSIS — Z7952 Long term (current) use of systemic steroids: Secondary | ICD-10-CM | POA: Insufficient documentation

## 2015-01-21 DIAGNOSIS — H00013 Hordeolum externum right eye, unspecified eyelid: Secondary | ICD-10-CM

## 2015-01-21 DIAGNOSIS — Z720 Tobacco use: Secondary | ICD-10-CM | POA: Diagnosis not present

## 2015-01-21 DIAGNOSIS — I10 Essential (primary) hypertension: Secondary | ICD-10-CM | POA: Diagnosis not present

## 2015-01-21 DIAGNOSIS — L03211 Cellulitis of face: Secondary | ICD-10-CM | POA: Insufficient documentation

## 2015-01-21 DIAGNOSIS — Z8659 Personal history of other mental and behavioral disorders: Secondary | ICD-10-CM | POA: Diagnosis not present

## 2015-01-21 DIAGNOSIS — Y9289 Other specified places as the place of occurrence of the external cause: Secondary | ICD-10-CM | POA: Insufficient documentation

## 2015-01-21 DIAGNOSIS — H538 Other visual disturbances: Secondary | ICD-10-CM | POA: Insufficient documentation

## 2015-01-21 DIAGNOSIS — X58XXXA Exposure to other specified factors, initial encounter: Secondary | ICD-10-CM | POA: Insufficient documentation

## 2015-01-21 DIAGNOSIS — G43909 Migraine, unspecified, not intractable, without status migrainosus: Secondary | ICD-10-CM | POA: Diagnosis not present

## 2015-01-21 DIAGNOSIS — Z85819 Personal history of malignant neoplasm of unspecified site of lip, oral cavity, and pharynx: Secondary | ICD-10-CM | POA: Diagnosis not present

## 2015-01-21 DIAGNOSIS — Y9389 Activity, other specified: Secondary | ICD-10-CM | POA: Insufficient documentation

## 2015-01-21 DIAGNOSIS — S0501XA Injury of conjunctiva and corneal abrasion without foreign body, right eye, initial encounter: Secondary | ICD-10-CM | POA: Insufficient documentation

## 2015-01-21 DIAGNOSIS — Z79899 Other long term (current) drug therapy: Secondary | ICD-10-CM | POA: Insufficient documentation

## 2015-01-21 DIAGNOSIS — H5711 Ocular pain, right eye: Secondary | ICD-10-CM | POA: Diagnosis present

## 2015-01-21 DIAGNOSIS — L03213 Periorbital cellulitis: Secondary | ICD-10-CM

## 2015-01-21 LAB — CBC
HEMATOCRIT: 43.2 % (ref 36.0–46.0)
HEMOGLOBIN: 14.3 g/dL (ref 12.0–15.0)
MCH: 30.2 pg (ref 26.0–34.0)
MCHC: 33.1 g/dL (ref 30.0–36.0)
MCV: 91.3 fL (ref 78.0–100.0)
Platelets: 198 10*3/uL (ref 150–400)
RBC: 4.73 MIL/uL (ref 3.87–5.11)
RDW: 12.7 % (ref 11.5–15.5)
WBC: 9.2 10*3/uL (ref 4.0–10.5)

## 2015-01-21 LAB — BASIC METABOLIC PANEL
ANION GAP: 8 (ref 5–15)
BUN: 10 mg/dL (ref 6–20)
CHLORIDE: 100 mmol/L — AB (ref 101–111)
CO2: 27 mmol/L (ref 22–32)
CREATININE: 0.65 mg/dL (ref 0.44–1.00)
Calcium: 9.4 mg/dL (ref 8.9–10.3)
GFR calc non Af Amer: >60 mL/min (ref >60)
Glucose, Bld: 187 mg/dL — ABNORMAL HIGH (ref 65–99)
POTASSIUM: 3.9 mmol/L (ref 3.5–5.1)
SODIUM: 135 mmol/L (ref 135–145)

## 2015-01-21 MED ORDER — MORPHINE SULFATE (PF) 4 MG/ML IV SOLN
4.0000 mg | Freq: Once | INTRAVENOUS | Status: AC
  Administered 2015-01-21: 4 mg via INTRAVENOUS
  Filled 2015-01-21: qty 1

## 2015-01-21 MED ORDER — FLUORESCEIN SODIUM 1 MG OP STRP
1.0000 | ORAL_STRIP | Freq: Once | OPHTHALMIC | Status: AC
  Administered 2015-01-21: 1 via OPHTHALMIC
  Filled 2015-01-21: qty 1

## 2015-01-21 MED ORDER — IOHEXOL 300 MG/ML  SOLN
75.0000 mL | Freq: Once | INTRAMUSCULAR | Status: AC | PRN
  Administered 2015-01-21: 75 mL via INTRAVENOUS

## 2015-01-21 MED ORDER — SODIUM CHLORIDE 0.9 % IV BOLUS (SEPSIS)
1000.0000 mL | Freq: Once | INTRAVENOUS | Status: AC
  Administered 2015-01-21: 1000 mL via INTRAVENOUS

## 2015-01-21 MED ORDER — ERYTHROMYCIN 5 MG/GM OP OINT: TOPICAL_OINTMENT | OPHTHALMIC | Status: DC

## 2015-01-21 MED ORDER — CLINDAMYCIN HCL 150 MG PO CAPS: 450.0000 mg | ORAL_CAPSULE | Freq: Three times a day (TID) | ORAL | Status: DC

## 2015-01-21 MED ORDER — TETRACAINE HCL 0.5 % OP SOLN
2.0000 [drp] | Freq: Once | OPHTHALMIC | Status: AC
  Administered 2015-01-21: 2 [drp] via OPHTHALMIC
  Filled 2015-01-21: qty 2

## 2015-01-21 NOTE — ED Notes (Addendum)
Pt from home for eval of right eye pain that started 4 days ago, drainage and reddness noted to inner corner of right eye with cellulitis noted from eye to cheek. Pt states hx of infection in eyes and conjuctivitis. nad noted.
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
## 2015-03-06 ENCOUNTER — Emergency Department (HOSPITAL_COMMUNITY): Payer: Medicaid Other

## 2015-03-06 ENCOUNTER — Encounter (HOSPITAL_COMMUNITY): Payer: Self-pay | Admitting: Emergency Medicine

## 2015-03-06 ENCOUNTER — Emergency Department (HOSPITAL_COMMUNITY)
Admission: EM | Admit: 2015-03-06 | Discharge: 2015-03-06 | Discharge status: Against Medical Advice | Payer: Medicaid Other | Attending: Emergency Medicine | Admitting: Emergency Medicine

## 2015-03-06 DIAGNOSIS — R079 Chest pain, unspecified: Secondary | ICD-10-CM | POA: Insufficient documentation

## 2015-03-06 DIAGNOSIS — Z880 Allergy status to penicillin: Secondary | ICD-10-CM | POA: Insufficient documentation

## 2015-03-06 DIAGNOSIS — I2510 Atherosclerotic heart disease of native coronary artery without angina pectoris: Secondary | ICD-10-CM | POA: Insufficient documentation

## 2015-03-06 DIAGNOSIS — J449 Chronic obstructive pulmonary disease, unspecified: Secondary | ICD-10-CM | POA: Insufficient documentation

## 2015-03-06 DIAGNOSIS — F17210 Nicotine dependence, cigarettes, uncomplicated: Secondary | ICD-10-CM | POA: Insufficient documentation

## 2015-03-06 DIAGNOSIS — Z7984 Long term (current) use of oral hypoglycemic drugs: Secondary | ICD-10-CM | POA: Diagnosis not present

## 2015-03-06 DIAGNOSIS — Z85819 Personal history of malignant neoplasm of unspecified site of lip, oral cavity, and pharynx: Secondary | ICD-10-CM | POA: Diagnosis not present

## 2015-03-06 DIAGNOSIS — E669 Obesity, unspecified: Secondary | ICD-10-CM | POA: Insufficient documentation

## 2015-03-06 DIAGNOSIS — Z79899 Other long term (current) drug therapy: Secondary | ICD-10-CM | POA: Insufficient documentation

## 2015-03-06 DIAGNOSIS — Z8659 Personal history of other mental and behavioral disorders: Secondary | ICD-10-CM | POA: Insufficient documentation

## 2015-03-06 DIAGNOSIS — Z794 Long term (current) use of insulin: Secondary | ICD-10-CM | POA: Insufficient documentation

## 2015-03-06 DIAGNOSIS — I10 Essential (primary) hypertension: Secondary | ICD-10-CM | POA: Diagnosis not present

## 2015-03-06 DIAGNOSIS — E119 Type 2 diabetes mellitus without complications: Secondary | ICD-10-CM | POA: Diagnosis not present

## 2015-03-06 DIAGNOSIS — Z8669 Personal history of other diseases of the nervous system and sense organs: Secondary | ICD-10-CM | POA: Insufficient documentation

## 2015-03-06 LAB — I-STAT TROPONIN, ED: Troponin i, poc: 0.01 ng/mL (ref 0.00–0.08)

## 2015-03-06 LAB — BASIC METABOLIC PANEL
ANION GAP: 8 (ref 5–15)
BUN: 12 mg/dL (ref 6–20)
CO2: 25 mmol/L (ref 22–32)
Calcium: 9.6 mg/dL (ref 8.9–10.3)
Chloride: 101 mmol/L (ref 101–111)
Creatinine, Ser: 0.69 mg/dL (ref 0.44–1.00)
GFR calc Af Amer: >60 mL/min (ref >60)
GLUCOSE: 337 mg/dL — AB (ref 65–99)
POTASSIUM: 4.5 mmol/L (ref 3.5–5.1)
Sodium: 134 mmol/L — ABNORMAL LOW (ref 135–145)

## 2015-03-06 LAB — CBC
HEMATOCRIT: 43.1 % (ref 36.0–46.0)
HEMOGLOBIN: 14.3 g/dL (ref 12.0–15.0)
MCH: 30.3 pg (ref 26.0–34.0)
MCHC: 33.2 g/dL (ref 30.0–36.0)
MCV: 91.3 fL (ref 78.0–100.0)
Platelets: 188 10*3/uL (ref 150–400)
RBC: 4.72 MIL/uL (ref 3.87–5.11)
RDW: 12.7 % (ref 11.5–15.5)
WBC: 7.6 10*3/uL (ref 4.0–10.5)

## 2015-03-06 MED ORDER — ONDANSETRON HCL 4 MG/2ML IJ SOLN
4.0000 mg | Freq: Once | INTRAMUSCULAR | Status: AC
  Administered 2015-03-06: 4 mg via INTRAVENOUS
  Filled 2015-03-06: qty 2

## 2015-03-06 NOTE — ED Notes (Signed)
Patient became upset when this RN went to give her nausea medicine because she does not have any pain medicine ordered. Patient stated she had waited an hour and was told she was going to get something for pain too. This RN explained to pt that she only has Zofran ordered but I would ask the doctor. MD made aware.  No new orders at this time.

## 2015-03-06 NOTE — Discharge Instructions (Signed)
Please return to emergency room if any new or worsening signs or symptoms present. Please contact her primary care provider immediately and schedule follow-up evaluation.

## 2015-03-06 NOTE — ED Notes (Signed)
pt left AMA; signature form signed by patient and risks of leaving AMA explained- pt verbalized understanding
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
## 2015-03-06 NOTE — ED Notes (Signed)
Pt here from home with [facility] for CP that began after patient walked from home to local store for food; CP began when patient returned home and ate chicken nuggets; pt describes CP as "elephant sitting on my chest"; pt also reports numbness/tingling in L arm and top of head; EMS reports patient appeared anxious and was crying upon EMS arrival on scene; pt also reports a productive cough and nausea x 2 days; EMS reports patients CP is worse with palpation; pt reports taking 1 NTG without improvement; VSS pt CAOx4

## 2015-03-24 DIAGNOSIS — R42 Dizziness and giddiness: Secondary | ICD-10-CM

## 2015-03-24 DIAGNOSIS — I69398 Other sequelae of cerebral infarction: Secondary | ICD-10-CM

## 2015-03-24 HISTORY — DX: Other sequelae of cerebral infarction: R42

## 2015-03-24 HISTORY — DX: Other sequelae of cerebral infarction: I69.398

## 2015-04-01 ENCOUNTER — Emergency Department (HOSPITAL_COMMUNITY)
Admission: EM | Admit: 2015-04-01 | Discharge: 2015-04-01 | Disposition: A | Discharge status: Home / Self Care | Payer: Medicaid Other | Attending: Emergency Medicine | Admitting: Emergency Medicine

## 2015-04-01 DIAGNOSIS — F17210 Nicotine dependence, cigarettes, uncomplicated: Secondary | ICD-10-CM | POA: Insufficient documentation

## 2015-04-01 DIAGNOSIS — Z79899 Other long term (current) drug therapy: Secondary | ICD-10-CM | POA: Insufficient documentation

## 2015-04-01 DIAGNOSIS — Z9114 Patient's other noncompliance with medication regimen: Secondary | ICD-10-CM

## 2015-04-01 DIAGNOSIS — R739 Hyperglycemia, unspecified: Secondary | ICD-10-CM

## 2015-04-01 DIAGNOSIS — Z8669 Personal history of other diseases of the nervous system and sense organs: Secondary | ICD-10-CM | POA: Insufficient documentation

## 2015-04-01 DIAGNOSIS — E669 Obesity, unspecified: Secondary | ICD-10-CM | POA: Diagnosis not present

## 2015-04-01 DIAGNOSIS — J449 Chronic obstructive pulmonary disease, unspecified: Secondary | ICD-10-CM | POA: Diagnosis not present

## 2015-04-01 DIAGNOSIS — I2510 Atherosclerotic heart disease of native coronary artery without angina pectoris: Secondary | ICD-10-CM | POA: Diagnosis not present

## 2015-04-01 DIAGNOSIS — Z8659 Personal history of other mental and behavioral disorders: Secondary | ICD-10-CM | POA: Diagnosis not present

## 2015-04-01 DIAGNOSIS — Z9119 Patient's noncompliance with other medical treatment and regimen: Secondary | ICD-10-CM | POA: Insufficient documentation

## 2015-04-01 DIAGNOSIS — E1165 Type 2 diabetes mellitus with hyperglycemia: Secondary | ICD-10-CM | POA: Diagnosis present

## 2015-04-01 DIAGNOSIS — Z7984 Long term (current) use of oral hypoglycemic drugs: Secondary | ICD-10-CM | POA: Diagnosis not present

## 2015-04-01 DIAGNOSIS — R251 Tremor, unspecified: Secondary | ICD-10-CM | POA: Diagnosis not present

## 2015-04-01 DIAGNOSIS — G43909 Migraine, unspecified, not intractable, without status migrainosus: Secondary | ICD-10-CM | POA: Insufficient documentation

## 2015-04-01 DIAGNOSIS — Z794 Long term (current) use of insulin: Secondary | ICD-10-CM | POA: Diagnosis not present

## 2015-04-01 DIAGNOSIS — Z85819 Personal history of malignant neoplasm of unspecified site of lip, oral cavity, and pharynx: Secondary | ICD-10-CM | POA: Insufficient documentation

## 2015-04-01 DIAGNOSIS — Z880 Allergy status to penicillin: Secondary | ICD-10-CM | POA: Diagnosis not present

## 2015-04-01 DIAGNOSIS — I10 Essential (primary) hypertension: Secondary | ICD-10-CM | POA: Diagnosis not present

## 2015-04-01 LAB — I-STAT CHEM 8, ED
BUN: 15 mg/dL (ref 6–20)
CALCIUM ION: 1.14 mmol/L (ref 1.12–1.23)
Chloride: 98 mmol/L — ABNORMAL LOW (ref 101–111)
Creatinine, Ser: 0.6 mg/dL (ref 0.44–1.00)
Glucose, Bld: 385 mg/dL — ABNORMAL HIGH (ref 65–99)
HEMATOCRIT: 42 % (ref 36.0–46.0)
HEMOGLOBIN: 14.3 g/dL (ref 12.0–15.0)
Potassium: 4.1 mmol/L (ref 3.5–5.1)
SODIUM: 136 mmol/L (ref 135–145)
TCO2: 27 mmol/L (ref 0–100)

## 2015-04-01 LAB — URINALYSIS, ROUTINE W REFLEX MICROSCOPIC
BILIRUBIN URINE: NEGATIVE
Glucose, UA: >1000 mg/dL — AB
KETONES UR: NEGATIVE mg/dL
Leukocytes, UA: NEGATIVE
NITRITE: NEGATIVE
PROTEIN: NEGATIVE mg/dL
Specific Gravity, Urine: >1.046 — ABNORMAL HIGH (ref 1.005–1.030)
pH: 6 (ref 5.0–8.0)

## 2015-04-01 LAB — CBG MONITORING, ED
GLUCOSE-CAPILLARY: 376 mg/dL — AB (ref 65–99)
Glucose-Capillary: 343 mg/dL — ABNORMAL HIGH (ref 65–99)

## 2015-04-01 LAB — URINE MICROSCOPIC-ADD ON: WBC, UA: NONE SEEN WBC/hpf (ref 0–5)

## 2015-04-01 LAB — VALPROIC ACID LEVEL

## 2015-04-01 MED ORDER — INSULIN ASPART 100 UNIT/ML ~~LOC~~ SOLN
10.0000 [IU] | Freq: Once | SUBCUTANEOUS | Status: AC
  Administered 2015-04-01: 10 [IU] via SUBCUTANEOUS
  Filled 2015-04-01: qty 1

## 2015-04-01 MED ORDER — DIVALPROEX SODIUM ER 500 MG PO TB24: 500.0000 mg | ORAL_TABLET | Freq: Two times a day (BID) | ORAL | Status: DC
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
## 2015-04-01 NOTE — Discharge Instructions (Signed)
Hyperglycemia °Hyperglycemia occurs when the glucose (sugar) in your blood is too high. Hyperglycemia can happen for many reasons, but it most often happens to people who do not know they have diabetes or are not managing their diabetes properly.  °CAUSES  °Whether you have diabetes or not, there are other causes of hyperglycemia. Hyperglycemia can occur when you have diabetes, but it can also occur in other situations that you might not be as aware of, such as: °Diabetes °· If you have diabetes and are having problems controlling your blood glucose, hyperglycemia could occur because of some of the following reasons: °¨ Not following your meal plan. °¨ Not taking your diabetes medications or not taking it properly. °¨ Exercising less or doing less activity than you normally do. °¨ Being sick. °Pre-diabetes °· This cannot be ignored. Before people develop Type 2 diabetes, they almost always have "pre-diabetes." This is when your blood glucose levels are higher than normal, but not yet high enough to be diagnosed as diabetes. Research has shown that some long-term damage to the body, especially the heart and circulatory system, may already be occurring during pre-diabetes. If you take action to manage your blood glucose when you have pre-diabetes, you may delay or prevent Type 2 diabetes from developing. °Stress °· If you have diabetes, you may be "diet" controlled or on oral medications or insulin to control your diabetes. However, you may find that your blood glucose is higher than usual in the hospital whether you have diabetes or not. This is often referred to as "stress hyperglycemia." Stress can elevate your blood glucose. This happens because of hormones put out by the body during times of stress. If stress has been the cause of your high blood glucose, it can be followed regularly by your caregiver. That way he/she can make sure your hyperglycemia does not continue to get worse or progress to  diabetes. °Steroids °· Steroids are medications that act on the infection fighting system (immune system) to block inflammation or infection. One side effect can be a rise in blood glucose. Most people can produce enough extra insulin to allow for this rise, but for those who cannot, steroids make blood glucose levels go even higher. It is not unusual for steroid treatments to "uncover" diabetes that is developing. It is not always possible to determine if the hyperglycemia will go away after the steroids are stopped. A special blood test called an A1c is sometimes done to determine if your blood glucose was elevated before the steroids were started. °SYMPTOMS °· Thirsty. °· Frequent urination. °· Dry mouth. °· Blurred vision. °· Tired or fatigue. °· Weakness. °· Sleepy. °· Tingling in feet or leg. °DIAGNOSIS  °Diagnosis is made by monitoring blood glucose in one or all of the following ways: °· A1c test. This is a chemical found in your blood. °· Fingerstick blood glucose monitoring. °· Laboratory results. °TREATMENT  °First, knowing the cause of the hyperglycemia is important before the hyperglycemia can be treated. Treatment may include, but is not be limited to: °· Education. °· Change or adjustment in medications. °· Change or adjustment in meal plan. °· Treatment for an illness, infection, etc. °· More frequent blood glucose monitoring. °· Change in exercise plan. °· Decreasing or stopping steroids. °· Lifestyle changes. °HOME CARE INSTRUCTIONS  °· Test your blood glucose as directed. °· Exercise regularly. Your caregiver will give you instructions about exercise. Pre-diabetes or diabetes which comes on with stress is helped by exercising. °· Eat wholesome,   balanced meals. Eat often and at regular, fixed times. Your caregiver or nutritionist will give you a meal plan to guide your sugar intake. °· Being at an ideal weight is important. If needed, losing as little as 10 to 15 pounds may help improve blood  glucose levels. °SEEK MEDICAL CARE IF:  °· You have questions about medicine, activity, or diet. °· You continue to have symptoms (problems such as increased thirst, urination, or weight gain). °SEEK IMMEDIATE MEDICAL CARE IF:  °· You are vomiting or have diarrhea. °· Your breath smells fruity. °· You are breathing faster or slower. °· You are very sleepy or incoherent. °· You have numbness, tingling, or pain in your feet or hands. °· You have chest pain. °· Your symptoms get worse even though you have been following your caregiver's orders. °· If you have any other questions or concerns. °  °This information is not intended to replace advice given to you by your health care provider. Make sure you discuss any questions you have with your health care provider. °  °Document Released: 09/02/2000 Document Revised: 06/01/2011 Document Reviewed: 11/13/2014 °Elsevier Interactive Patient Education ©2016 [employer] ° °

## 2015-04-01 NOTE — ED Notes (Signed)
Patient was alert, oriented and stable upon discharge. RN went over AVS and patient had no further questions.  

## 2015-04-01 NOTE — ED Provider Notes (Signed)
CSN: [id number]     Arrival date & time 04/01/15  1934 History   First MD Initiated Contact with Patient 04/01/15 2006     Chief Complaint  Patient presents with  . Seizures  . Hyperglycemia     (Consider location/radiation/quality/duration/timing/severity/associated sxs/prior Treatment) HPI Patient brought in by EMS for shaking episode. Per EMS patient got upset, lowered herself floor and began shaking for roughly 45 seconds. No incontinence. Patient is noncompliant with her medications. Noted to have elevated blood sugar en route. Patient is now complaining of headache though no trauma present.  Past Medical History  Diagnosis Date  . Hypertension   . Seizures ([place])   . COPD (chronic obstructive pulmonary disease) ([place])   . Diabetes mellitus without complication ([place])   . Sleep apnea   . Coronary artery disease   . Schizophrenia ([place])   . Migraine   . Addiction to drug ([place])   . Obesity   . Cancer (HCC) throat  . Throat cancer [facility])    Past Surgical History  Procedure Laterality Date  . Abdominal hysterectomy    . Appendectomy    . Cholecystectomy    . Eye surgery    . Cesarean section    . Knee surgery     Family History  Problem Relation Age of Onset  . Cancer Father    Social History  Substance Use Topics  . Smoking status: Current Every Day Smoker -- 0.50 packs/day for 30 years    Types: Cigarettes  . Smokeless tobacco: Never Used  . Alcohol Use: No   OB History    No data available     Review of Systems  Cardiovascular: Negative for chest pain.  Gastrointestinal: Negative for nausea, vomiting and abdominal pain.  Musculoskeletal: Negative for neck pain.  Neurological: Positive for tremors and headaches. Negative for weakness.  All other systems reviewed and are negative.     Allergies  Aspirin; Bee venom; Other; Toradol; Naproxen; Tramadol; Penicillins; and Vancomycin  Home Medications   Prior to Admission medications   Medication Sig Start  Date End Date Taking? Authorizing Provider  albuterol (PROVENTIL HFA;VENTOLIN HFA) 108 (90 BASE) MCG/ACT inhaler Inhale 2 puffs into the lungs every 6 (six) hours as needed. Reported on 03/06/2015 10/27/10  Yes Historical Provider, MD  cyclobenzaprine (FLEXERIL) 10 MG tablet Take 1 tablet (10 mg total) by mouth 3 (three) times daily as needed for muscle spasms. 12/15/13  Yes Clayton Bibles, PA-C  erythromycin ophthalmic ointment Place a 1/2 inch ribbon of ointment into the lower eyelid. Patient taking differently: Place 1 application into both eyes daily as needed (infection). Place a 1/2 inch ribbon of ointment into the lower eyelid. 01/21/15  Yes Voncille Lo, MD  metFORMIN (GLUCOPHAGE) 500 MG tablet Take 1 tablet (500 mg total) by mouth 2 (two) times daily with a meal. 10/06/14  Yes Linton Flemings, MD  nitroGLYCERIN (NITROSTAT) 0.4 MG SL tablet Place 0.4 mg under the tongue every 5 (five) minutes as needed for chest pain.   Yes Historical Provider, MD  clindamycin (CLEOCIN) 150 MG capsule Take 3 capsules (450 mg total) by mouth 3 (three) times daily. Patient not taking: Reported on 03/06/2015 01/21/15   Voncille Lo, MD  divalproex (DEPAKOTE ER) 500 MG 24 hr tablet Take 1 tablet (500 mg total) by mouth 2 (two) times daily. 04/01/15   Julianne Rice, MD  Guaifenesin 1200 MG TB12 Take 1 tablet (1,200 mg total) by mouth 2 (two) times daily. Patient not taking: Reported  on 03/06/2015 12/09/14   Dalia Heading, PA-C  insulin aspart protamine- aspart (NOVOLOG MIX 70/30) (70-30) 100 UNIT/ML injection Inject 10 Units into the skin 2 (two) times daily with a meal. Reported on 04/01/2015    Historical Provider, MD  predniSONE (DELTASONE) 10 MG tablet Take 2 tablets (20 mg total) by mouth daily. Patient not taking: Reported on 03/06/2015 12/09/14   Dalia Heading, PA-C   BP 108/87 mmHg  Pulse 79  Temp(Src) 98.6 F (37 C) (Oral)  Resp 14  SpO2 98% Physical Exam  Constitutional: She appears well-developed and  well-nourished. No distress.  HENT:  Head: Normocephalic and atraumatic.  Mouth/Throat: Oropharynx is clear and moist. No oropharyngeal exudate.  No intraoral trauma  Eyes: EOM are normal. Pupils are equal, round, and reactive to light.  Neck: Normal range of motion. Neck supple.  No posterior midline cervical tenderness to palpation. No meningismus.  Cardiovascular: Normal rate and regular rhythm.  Exam reveals no gallop and no friction rub.   No murmur heard. Pulmonary/Chest: Effort normal and breath sounds normal. No respiratory distress. She has no wheezes. She has no rales. She exhibits no tenderness.  Abdominal: Soft. Bowel sounds are normal. She exhibits no distension and no mass. There is no tenderness. There is no rebound and no guarding.  Musculoskeletal: Normal range of motion. She exhibits no edema or tenderness.  No midline thoracic or lumbar tenderness. No lower sugary swelling or pain. Distal pulses are equal and intact.  Neurological: She is alert.  Patient is intentionally speaking in a childish manner calling her IV a "boo-boo". She is moving all extremities. Sensation appears to be intact.  Skin: Skin is warm and dry. No rash noted. No erythema.  Psychiatric: She has a normal mood and affect.  Childlike behavior  Nursing note and vitals reviewed.   ED Course  Procedures (including critical care time) Labs Review Labs Reviewed  URINALYSIS, ROUTINE W REFLEX MICROSCOPIC (NOT AT [facility]) - Abnormal; Notable for the following:    APPearance CLOUDY (*)    Specific Gravity, Urine >1.046 (*)    Glucose, UA >1000 (*)    Hgb urine dipstick SMALL (*)    All other components within normal limits  VALPROIC ACID LEVEL - Abnormal; Notable for the following:    Valproic Acid Lvl <10 (*)    All other components within normal limits  URINE MICROSCOPIC-ADD ON - Abnormal; Notable for the following:    Squamous Epithelial / LPF 6-30 (*)    Bacteria, UA RARE (*)    All other  components within normal limits  CBG MONITORING, ED - Abnormal; Notable for the following:    Glucose-Capillary 343 (*)    All other components within normal limits  I-STAT CHEM 8, ED - Abnormal; Notable for the following:    Chloride 98 (*)    Glucose, Bld 385 (*)    All other components within normal limits  CBG MONITORING, ED - Abnormal; Notable for the following:    Glucose-Capillary 376 (*)    All other components within normal limits    Imaging Review No results found. I have personally reviewed and evaluated these images and lab results as part of my medical decision-making.   EKG Interpretation None      MDM   Final diagnoses:  Hyperglycemia  Non compliance w medication regimen  Episode of shaking    Patient has history of pseudoseizures and medical noncompliance as well as drug-seeking behavior. Questional seizure when becoming upset that this  is in doubt. Patient does have elevated blood glucose. Will screen with basic labs and UA.   Patient is at her baseline mental status. She is requesting to be discharged home. This is a frequent pattern for this patient. She's been given insulin in the emergency department and will refill Depakote prescription. Return precautions given.  Julianne Rice, MD 04/01/15 2232

## 2015-05-31 ENCOUNTER — Encounter (HOSPITAL_COMMUNITY): Payer: Self-pay | Admitting: Emergency Medicine

## 2015-05-31 ENCOUNTER — Emergency Department (HOSPITAL_COMMUNITY)
Admission: EM | Admit: 2015-05-31 | Discharge: 2015-05-31 | Disposition: A | Discharge status: Home / Self Care | Payer: Medicaid Other | Attending: Emergency Medicine | Admitting: Emergency Medicine

## 2015-05-31 DIAGNOSIS — I10 Essential (primary) hypertension: Secondary | ICD-10-CM | POA: Diagnosis not present

## 2015-05-31 DIAGNOSIS — J449 Chronic obstructive pulmonary disease, unspecified: Secondary | ICD-10-CM | POA: Diagnosis not present

## 2015-05-31 DIAGNOSIS — H578 Other specified disorders of eye and adnexa: Secondary | ICD-10-CM | POA: Insufficient documentation

## 2015-05-31 DIAGNOSIS — F17210 Nicotine dependence, cigarettes, uncomplicated: Secondary | ICD-10-CM | POA: Diagnosis not present

## 2015-05-31 DIAGNOSIS — I2510 Atherosclerotic heart disease of native coronary artery without angina pectoris: Secondary | ICD-10-CM | POA: Insufficient documentation

## 2015-05-31 DIAGNOSIS — E119 Type 2 diabetes mellitus without complications: Secondary | ICD-10-CM | POA: Insufficient documentation

## 2015-05-31 DIAGNOSIS — H538 Other visual disturbances: Secondary | ICD-10-CM | POA: Insufficient documentation

## 2015-05-31 NOTE — ED Notes (Signed)
Patient said she could not see anything on the eye chart with either eye. She said It was blurry and she needs glasses but doesn't have any".

## 2015-05-31 NOTE — ED Notes (Addendum)
Pt c/o sty to right eye x 1 1/2 weeks ago. Pt reports history of sty to same eye in past. Sty burst recently causing redness to right upper and lower eyelid. Pt reports that she does not have money for any prescriptions, if given.

## 2015-05-31 NOTE — ED Notes (Signed)
Pt states she wants to "sign out.Marland KitchenMarland KitchenI haven't eaten today" Pt signed AMA form.

## 2015-05-31 NOTE — ED Notes (Signed)
Pt c/o "stye" right eye. Started 1.5 weeks ago on lower lid. States "it popped and went to the upper lid'. Right eyelid red and swollen.

## 2015-06-02 ENCOUNTER — Encounter (HOSPITAL_COMMUNITY): Payer: Self-pay | Admitting: Emergency Medicine

## 2015-06-02 ENCOUNTER — Emergency Department (HOSPITAL_COMMUNITY)
Admission: EM | Admit: 2015-06-02 | Discharge: 2015-06-02 | Disposition: A | Discharge status: Home / Self Care | Payer: Medicaid Other | Attending: Emergency Medicine | Admitting: Emergency Medicine

## 2015-06-02 ENCOUNTER — Emergency Department (HOSPITAL_COMMUNITY): Payer: Medicaid Other

## 2015-06-02 DIAGNOSIS — E119 Type 2 diabetes mellitus without complications: Secondary | ICD-10-CM | POA: Diagnosis not present

## 2015-06-02 DIAGNOSIS — H00011 Hordeolum externum right upper eyelid: Secondary | ICD-10-CM | POA: Insufficient documentation

## 2015-06-02 DIAGNOSIS — Y998 Other external cause status: Secondary | ICD-10-CM | POA: Diagnosis not present

## 2015-06-02 DIAGNOSIS — H00013 Hordeolum externum right eye, unspecified eyelid: Secondary | ICD-10-CM

## 2015-06-02 DIAGNOSIS — Z7984 Long term (current) use of oral hypoglycemic drugs: Secondary | ICD-10-CM | POA: Insufficient documentation

## 2015-06-02 DIAGNOSIS — S91311A Laceration without foreign body, right foot, initial encounter: Secondary | ICD-10-CM

## 2015-06-02 DIAGNOSIS — S91321A Laceration with foreign body, right foot, initial encounter: Secondary | ICD-10-CM | POA: Diagnosis present

## 2015-06-02 DIAGNOSIS — Z880 Allergy status to penicillin: Secondary | ICD-10-CM | POA: Diagnosis not present

## 2015-06-02 DIAGNOSIS — F17210 Nicotine dependence, cigarettes, uncomplicated: Secondary | ICD-10-CM | POA: Diagnosis not present

## 2015-06-02 DIAGNOSIS — Z85818 Personal history of malignant neoplasm of other sites of lip, oral cavity, and pharynx: Secondary | ICD-10-CM | POA: Insufficient documentation

## 2015-06-02 DIAGNOSIS — Z8659 Personal history of other mental and behavioral disorders: Secondary | ICD-10-CM | POA: Insufficient documentation

## 2015-06-02 DIAGNOSIS — G43909 Migraine, unspecified, not intractable, without status migrainosus: Secondary | ICD-10-CM | POA: Diagnosis not present

## 2015-06-02 DIAGNOSIS — J449 Chronic obstructive pulmonary disease, unspecified: Secondary | ICD-10-CM | POA: Diagnosis not present

## 2015-06-02 DIAGNOSIS — Y9289 Other specified places as the place of occurrence of the external cause: Secondary | ICD-10-CM | POA: Diagnosis not present

## 2015-06-02 DIAGNOSIS — Z794 Long term (current) use of insulin: Secondary | ICD-10-CM | POA: Diagnosis not present

## 2015-06-02 DIAGNOSIS — I2510 Atherosclerotic heart disease of native coronary artery without angina pectoris: Secondary | ICD-10-CM | POA: Insufficient documentation

## 2015-06-02 DIAGNOSIS — I10 Essential (primary) hypertension: Secondary | ICD-10-CM | POA: Insufficient documentation

## 2015-06-02 DIAGNOSIS — E669 Obesity, unspecified: Secondary | ICD-10-CM | POA: Insufficient documentation

## 2015-06-02 DIAGNOSIS — W25XXXA Contact with sharp glass, initial encounter: Secondary | ICD-10-CM | POA: Insufficient documentation

## 2015-06-02 DIAGNOSIS — Z79899 Other long term (current) drug therapy: Secondary | ICD-10-CM | POA: Insufficient documentation

## 2015-06-02 DIAGNOSIS — Y9389 Activity, other specified: Secondary | ICD-10-CM | POA: Diagnosis not present

## 2015-06-02 DIAGNOSIS — H00012 Hordeolum externum right lower eyelid: Secondary | ICD-10-CM | POA: Diagnosis not present

## 2015-06-02 MED ORDER — CLINDAMYCIN HCL 300 MG PO CAPS
300.0000 mg | ORAL_CAPSULE | Freq: Once | ORAL | Status: AC
  Administered 2015-06-02: 300 mg via ORAL
  Filled 2015-06-02: qty 1

## 2015-06-02 MED ORDER — HYDROCODONE-ACETAMINOPHEN 5-325 MG PO TABS
2.0000 | ORAL_TABLET | Freq: Once | ORAL | Status: AC
  Administered 2015-06-02: 2 via ORAL
  Filled 2015-06-02: qty 2

## 2015-06-02 MED ORDER — LIDOCAINE-EPINEPHRINE 1 %-1:100000 IJ SOLN
10.0000 mL | Freq: Once | INTRAMUSCULAR | Status: AC
  Administered 2015-06-02: 10 mL
  Filled 2015-06-02: qty 1

## 2015-06-02 MED ORDER — CLINDAMYCIN HCL 300 MG PO CAPS: 300.0000 mg | ORAL_CAPSULE | Freq: Three times a day (TID) | ORAL | Status: DC

## 2015-06-02 MED ORDER — ERYTHROMYCIN 5 MG/GM OP OINT: TOPICAL_OINTMENT | OPHTHALMIC | Status: AC

## 2015-06-02 NOTE — Discharge Instructions (Signed)
Stye Your stye has remained for longer than usual and had recurrent episodes of infection. Antibiotics prescribed as well as the antibiotic eye ointment. Apply warm compresses. You must schedule an appointment with the ophthalmologist listed in your discharge instructions to get follow-up and further treatment. A stye is a bump on your eyelid caused by a bacterial infection. A stye can form inside the eyelid (internal stye) or outside the eyelid (external stye). An internal stye may be caused by an infected oil-producing gland inside your eyelid. An external stye may be caused by an infection at the base of your eyelash (hair follicle). Styes are very common. Anyone can get them at any age. They usually occur in just one eye, but you may have more than one in either eye.  CAUSES  The infection is almost always caused by bacteria called Staphylococcus aureus. This is a common type of bacteria that lives on your skin. RISK FACTORS You may be at higher risk for a stye if you have had one before. You may also be at higher risk if you have:  Diabetes.  Long-term illness.  Long-term eye redness.  A skin condition called seborrhea.  High fat levels in your blood (lipids). SIGNS AND SYMPTOMS  Eyelid pain is the most common symptom of a stye. Internal styes are more painful than external styes. Other signs and symptoms may include:  Painful swelling of your eyelid.  A scratchy feeling in your eye.  Tearing and redness of your eye.  Pus draining from the stye. DIAGNOSIS  Your health care provider may be able to diagnose a stye just by examining your eye. The health care provider may also check to make sure:  You do not have a fever or other signs of a more serious infection.  The infection has not spread to other parts of your eye or areas around your eye. TREATMENT  Most styes will clear up in a few days without treatment. In some cases, you may need to use antibiotic drops or ointment to  prevent infection. Your health care provider may have to drain the stye surgically if your stye is:  Large.  Causing a lot of pain.  Interfering with your vision. This can be done using a thin blade or a needle.  HOME CARE INSTRUCTIONS   Take medicines only as directed by your health care provider.  Apply a clean, warm compress to your eye for 10 minutes, 4 times a day.  Do not wear contact lenses or eye makeup until your stye has healed.  Do not try to pop or drain the stye. SEEK MEDICAL CARE IF:  You have chills or a fever.  Your stye does not go away after several days.  Your stye affects your vision.  Your eyeball becomes swollen, red, or painful. MAKE SURE YOU:  Understand these instructions.  Will watch your condition.  Will get help right away if you are not doing well or get worse.   This information is not intended to replace advice given to you by your health care provider. Make sure you discuss any questions you have with your health care provider.   Document Released: 12/17/2004 Document Revised: 03/30/2014 Document Reviewed: 06/23/2013 Elsevier Interactive Patient Education 2016 [place], Adult A laceration is a cut that goes through all layers of the skin. The cut also goes into the tissue that is right under the skin. Some cuts heal on their own. Others need to be closed with  stitches (sutures), staples, skin adhesive strips, or wound glue. Taking care of your cut lowers your risk of infection and helps your cut to heal better. HOW TO TAKE CARE OF YOUR CUT For stitches or staples:  Keep the wound clean and dry.  If you were given a bandage (dressing), you should change it at least one time per day or as told by your doctor. You should also change it if it gets wet or dirty.  Keep the wound completely dry for the first 24 hours or as told by your doctor. After that time, you may take a shower or a bath. However, make sure that the  wound is not soaked in water until after the stitches or staples have been removed.  Clean the wound one time each day or as told by your doctor:  Wash the wound with soap and water.  Rinse the wound with water until all of the soap comes off.  Pat the wound dry with a clean towel. Do not rub the wound.  After you clean the wound, put a thin layer of antibiotic ointment on it as told by your doctor. This ointment:  Helps to prevent infection.  Keeps the bandage from sticking to the wound.  Have your stitches or staples removed as told by your doctor. If your doctor used skin adhesive strips:   Keep the wound clean and dry.  If you were given a bandage, you should change it at least one time per day or as told by your doctor. You should also change it if it gets dirty or wet.  Do not get the skin adhesive strips wet. You can take a shower or a bath, but be careful to keep the wound dry.  If the wound gets wet, pat it dry with a clean towel. Do not rub the wound.  Skin adhesive strips fall off on their own. You can trim the strips as the wound heals. Do not remove any strips that are still stuck to the wound. They will fall off after a while. If your doctor used wound glue:  Try to keep your wound dry, but you may briefly wet it in the shower or bath. Do not soak the wound in water, such as by swimming.  After you take a shower or a bath, gently pat the wound dry with a clean towel. Do not rub the wound.  Do not do any activities that will make you really sweaty until the skin glue has fallen off on its own.  Do not apply liquid, cream, or ointment medicine to your wound while the skin glue is still on.  If you were given a bandage, you should change it at least one time per day or as told by your doctor. You should also change it if it gets dirty or wet.  If a bandage is placed over the wound, do not let the tape for the bandage touch the skin glue.  Do not pick at the glue.  The skin glue usually stays on for 5-10 days. Then, it falls off of the skin. General Instructions  To help prevent scarring, make sure to cover your wound with sunscreen whenever you are outside after stitches are removed, after adhesive strips are removed, or when wound glue stays in place and the wound is healed. Make sure to wear a sunscreen of at least 30 SPF.  Take over-the-counter and prescription medicines only as told by your doctor.  If you were given  antibiotic medicine or ointment, take or apply it as told by your doctor. Do not stop using the antibiotic even if your wound is getting better.  Do not scratch or pick at the wound.  Keep all follow-up visits as told by your doctor. This is important.  Check your wound every day for signs of infection. Watch for:  Redness, swelling, or pain.  Fluid, blood, or pus.  Raise (elevate) the injured area above the level of your heart while you are sitting or lying down, if possible. GET HELP IF:  You got a tetanus shot and you have any of these problems at the injection site:  Swelling.  Very bad pain.  Redness.  Bleeding.  You have a fever.  A wound that was closed breaks open.  You notice a bad smell coming from your wound or your bandage.  You notice something coming out of the wound, such as wood or glass.  Medicine does not help your pain.  You have more redness, swelling, or pain at the site of your wound.  You have fluid, blood, or pus coming from your wound.  You notice a change in the color of your skin near your wound.  You need to change the bandage often because fluid, blood, or pus is coming from the wound.  You start to have a new rash.  You start to have numbness around the wound. GET HELP RIGHT AWAY IF:  You have very bad swelling around the wound.  Your pain suddenly gets worse and is very bad.  You notice painful lumps near the wound or on skin that is anywhere on your body.  You have a  red streak going away from your wound.  The wound is on your hand or foot and you cannot move a finger or toe like you usually can.  The wound is on your hand or foot and you notice that your fingers or toes look pale or bluish.   This information is not intended to replace advice given to you by your health care provider. Make sure you discuss any questions you have with your health care provider.   Document Released: 08/26/2007 Document Revised: 07/24/2014 Document Reviewed: 03/05/2014 Elsevier Interactive Patient Education [employer].

## 2015-06-02 NOTE — ED Notes (Signed)
Per EMS-states cut right foot on bottle, bleeding controlled

## 2015-06-02 NOTE — ED Provider Notes (Signed)
CSN: [id number]     Arrival date & time 06/02/15  1626 History   First MD Initiated Contact with Patient 06/02/15 1642     Chief Complaint  Patient presents with  . Extremity Laceration     (Consider location/radiation/quality/duration/timing/severity/associated sxs/prior Treatment) HPI Patient reports that [place] camps[place]. She reports she stepped on a glass bottle about an hour prior to arrival. She reports that it bled a lot but is not bleeding now. He reports it hurts a lot she is concerned because she is diabetic.  Second complaint is stye of the right eye. Patient reports that she's been having this problem on and off for a number of months. She reports she was given some ointment for it but it keeps coming back. She reports that this morning the eye was matted shut and she had to pry open. She reports despite having had this is a recurrent problem, she has not been seen by ophthalmology for it. Past Medical History  Diagnosis Date  . Hypertension   . Seizures ([place])   . COPD (chronic obstructive pulmonary disease) ([place])   . Diabetes mellitus without complication ([place])   . Sleep apnea   . Coronary artery disease   . Schizophrenia ([place])   . Migraine   . Addiction to drug ([place])   . Obesity   . Cancer (HCC) throat  . Throat cancer [facility])    Past Surgical History  Procedure Laterality Date  . Abdominal hysterectomy    . Appendectomy    . Cholecystectomy    . Eye surgery    . Cesarean section    . Knee surgery     Family History  Problem Relation Age of Onset  . Cancer Father    Social History  Substance Use Topics  . Smoking status: Current Every Day Smoker -- 0.50 packs/day for 30 years    Types: Cigarettes  . Smokeless tobacco: Never Used  . Alcohol Use: No   OB History    No data available     Review of Systems  10 Systems reviewed and are negative for acute change except as noted in the HPI.   Allergies  Aspirin; Bee venom; Other; Toradol; Naproxen;  Tramadol; Penicillins; and Vancomycin  Home Medications   Prior to Admission medications   Medication Sig Start Date End Date Taking? Authorizing Provider  albuterol (PROVENTIL HFA;VENTOLIN HFA) 108 (90 BASE) MCG/ACT inhaler Inhale 2 puffs into the lungs every 6 (six) hours as needed. Reported on 03/06/2015 10/27/10   Historical Provider, MD  clindamycin (CLEOCIN) 150 MG capsule Take 3 capsules (450 mg total) by mouth 3 (three) times daily. Patient not taking: Reported on 03/06/2015 01/21/15   Voncille Lo, MD  clindamycin (CLEOCIN) 300 MG capsule Take 1 capsule (300 mg total) by mouth 3 (three) times daily. X 7 days 06/02/15   Charlesetta Shanks, MD  cyclobenzaprine (FLEXERIL) 10 MG tablet Take 1 tablet (10 mg total) by mouth 3 (three) times daily as needed for muscle spasms. 12/15/13   Clayton Bibles, PA-C  divalproex (DEPAKOTE ER) 500 MG 24 hr tablet Take 1 tablet (500 mg total) by mouth 2 (two) times daily. 04/01/15   Julianne Rice, MD  erythromycin ophthalmic ointment Place a 1/2 inch ribbon of ointment into the lower eyelid. Patient taking differently: Place 1 application into both eyes daily as needed (infection). Place a 1/2 inch ribbon of ointment into the lower eyelid. 01/21/15   Voncille Lo, MD  erythromycin ophthalmic ointment Place  a 1/2 inch ribbon of ointment into the lower eyelid 4 times a day 06/02/15 06/09/15  Charlesetta Shanks, MD  Guaifenesin 1200 MG TB12 Take 1 tablet (1,200 mg total) by mouth 2 (two) times daily. Patient not taking: Reported on 03/06/2015 12/09/14   Dalia Heading, PA-C  insulin aspart protamine- aspart (NOVOLOG MIX 70/30) (70-30) 100 UNIT/ML injection Inject 10 Units into the skin 2 (two) times daily with a meal. Reported on 04/01/2015    Historical Provider, MD  metFORMIN (GLUCOPHAGE) 500 MG tablet Take 1 tablet (500 mg total) by mouth 2 (two) times daily with a meal. 10/06/14   Linton Flemings, MD  nitroGLYCERIN (NITROSTAT) 0.4 MG SL tablet Place 0.4 mg under the tongue every  5 (five) minutes as needed for chest pain.    Historical Provider, MD  predniSONE (DELTASONE) 10 MG tablet Take 2 tablets (20 mg total) by mouth daily. Patient not taking: Reported on 03/06/2015 12/09/14   Dalia Heading, PA-C   BP 114/67 mmHg  Pulse 82  Temp(Src) 98.6 F (37 C) (Oral)  Resp 20  SpO2 98% Physical Exam  Constitutional: She is oriented to person, place, and time.  Patient is moderately obese, nontoxic and alert. No respiratory distress. He is slightly tearful because the pain of her foot she reports.  HENT:  Head: Normocephalic and atraumatic.  Eyes: EOM are normal. Pupils are equal, round, and reactive to light.  Patient has moderate to large swelling of the right upper lid and lower lid with erythema. Focal area of nodular swelling medial lid upper. Extraocular motions intact. Pupils symmetric and responsive. Images attached  Neck: Neck supple.  Cardiovascular: Normal rate, regular rhythm, normal heart sounds and intact distal pulses.   Pulmonary/Chest: Effort normal and breath sounds normal.  Abdominal: Soft. Bowel sounds are normal. She exhibits no distension. There is no tenderness.  Musculoskeletal: Normal range of motion. She exhibits no edema.  Patient has a minor approximately 1 cm slightly irregular cut in the deep dermis of the right lateral foot. No active bleeding.   Neurological: She is alert and oriented to person, place, and time. She has normal strength. Coordination normal. GCS eye subscore is 4. GCS verbal subscore is 5. GCS motor subscore is 6.  Skin: Skin is warm, dry and intact.  Psychiatric: She has a normal mood and affect.        ED Course  .Foreign Body Removal Date/Time: 06/02/2015 8:01 PM Performed by: Charlesetta Shanks Authorized by: Charlesetta Shanks Patient identity confirmed: verbally with patient Body area: skin General location: lower extremity Location details: right foot Anesthesia: local infiltration Local anesthetic:  lidocaine 1% with epinephrine Anesthetic total: 2 ml Patient sedated: no Patient restrained: no Localization method: probed Removal mechanism: forceps Dressing: dressing applied Tendon involvement: none Depth: subcutaneous Complexity: simple 1 objects recovered. Objects recovered: Small shard of glass Post-procedure assessment: foreign body removed Patient tolerance: Patient tolerated the procedure well with no immediate complications Comments: Wound was extensively irrigated with bulb syringe and sterile normal saline. Base of wound was visualized to a depth of approximately 3 mm. Lord with a forceps with no further foreign body present.left open   (including critical care time)  Labs Review Labs Reviewed - No data to display  Imaging Review Dg Foot Complete Right  06/02/2015  CLINICAL DATA:  Pain to lateral fifth MTP joint region. Stepped on broken glass today. EXAM: RIGHT FOOT COMPLETE - 3+ VIEW COMPARISON:  None. FINDINGS: No acute bony abnormality. Specifically, no fracture, subluxation, or  dislocation. Soft tissues are intact. No visible radiopaque foreign body IMPRESSION: No acute bony abnormality. Electronically Signed   By: Rolm Baptise M.D.   On: 06/02/2015 17:11   I have personally reviewed and evaluated these images and lab results as part of my medical decision-making.   EKG Interpretation None      MDM   Final diagnoses:  Hordeolum, right  Foot laceration, right, initial encounter   Minor foot laceration into the deeper dermis. This was extensively cleaned irrigated and a small piece of glass removed. As as with hordeolum with lid cellulitis as illustrated above. He is placed on clindamycin and erythromycin for the lid cellulitis. She is counseled on the necessity of ophthalmology follow-up as this is been a recurrent and now almost chronic condition.    Charlesetta Shanks, MD 06/02/15 2004

## 2015-06-13 ENCOUNTER — Emergency Department (HOSPITAL_COMMUNITY)
Admission: EM | Admit: 2015-06-13 | Discharge: 2015-06-13 | Discharge status: Against Medical Advice | Payer: Medicaid Other

## 2015-06-13 NOTE — ED Notes (Signed)
Called for triage x1.

## 2015-06-13 NOTE — ED Notes (Signed)
Pt call for triage X2 no answer triage RN aware

## 2015-06-17 ENCOUNTER — Emergency Department (HOSPITAL_COMMUNITY)
Admission: EM | Admit: 2015-06-17 | Discharge: 2015-06-17 | Disposition: A | Discharge status: Home / Self Care | Payer: Medicaid Other | Attending: Emergency Medicine | Admitting: Emergency Medicine

## 2015-06-17 ENCOUNTER — Encounter (HOSPITAL_COMMUNITY): Payer: Self-pay

## 2015-06-17 DIAGNOSIS — E1165 Type 2 diabetes mellitus with hyperglycemia: Secondary | ICD-10-CM | POA: Diagnosis not present

## 2015-06-17 DIAGNOSIS — F17210 Nicotine dependence, cigarettes, uncomplicated: Secondary | ICD-10-CM | POA: Insufficient documentation

## 2015-06-17 DIAGNOSIS — I2510 Atherosclerotic heart disease of native coronary artery without angina pectoris: Secondary | ICD-10-CM | POA: Insufficient documentation

## 2015-06-17 DIAGNOSIS — G43909 Migraine, unspecified, not intractable, without status migrainosus: Secondary | ICD-10-CM | POA: Diagnosis not present

## 2015-06-17 DIAGNOSIS — E669 Obesity, unspecified: Secondary | ICD-10-CM | POA: Diagnosis not present

## 2015-06-17 DIAGNOSIS — N390 Urinary tract infection, site not specified: Secondary | ICD-10-CM | POA: Insufficient documentation

## 2015-06-17 DIAGNOSIS — Z7984 Long term (current) use of oral hypoglycemic drugs: Secondary | ICD-10-CM | POA: Diagnosis not present

## 2015-06-17 DIAGNOSIS — Z794 Long term (current) use of insulin: Secondary | ICD-10-CM | POA: Diagnosis not present

## 2015-06-17 DIAGNOSIS — J449 Chronic obstructive pulmonary disease, unspecified: Secondary | ICD-10-CM | POA: Diagnosis not present

## 2015-06-17 DIAGNOSIS — Z85818 Personal history of malignant neoplasm of other sites of lip, oral cavity, and pharynx: Secondary | ICD-10-CM | POA: Diagnosis not present

## 2015-06-17 DIAGNOSIS — Z79899 Other long term (current) drug therapy: Secondary | ICD-10-CM | POA: Insufficient documentation

## 2015-06-17 DIAGNOSIS — Z880 Allergy status to penicillin: Secondary | ICD-10-CM | POA: Insufficient documentation

## 2015-06-17 DIAGNOSIS — I10 Essential (primary) hypertension: Secondary | ICD-10-CM | POA: Insufficient documentation

## 2015-06-17 DIAGNOSIS — Z8659 Personal history of other mental and behavioral disorders: Secondary | ICD-10-CM | POA: Insufficient documentation

## 2015-06-17 DIAGNOSIS — R102 Pelvic and perineal pain: Secondary | ICD-10-CM | POA: Diagnosis present

## 2015-06-17 DIAGNOSIS — R739 Hyperglycemia, unspecified: Secondary | ICD-10-CM

## 2015-06-17 LAB — BASIC METABOLIC PANEL
Anion gap: 9 (ref 5–15)
BUN: 12 mg/dL (ref 6–20)
CALCIUM: 9.4 mg/dL (ref 8.9–10.3)
CO2: 27 mmol/L (ref 22–32)
CREATININE: 0.77 mg/dL (ref 0.44–1.00)
Chloride: 102 mmol/L (ref 101–111)
Glucose, Bld: 382 mg/dL — ABNORMAL HIGH (ref 65–99)
Potassium: 4.8 mmol/L (ref 3.5–5.1)
SODIUM: 138 mmol/L (ref 135–145)

## 2015-06-17 LAB — URINALYSIS, ROUTINE W REFLEX MICROSCOPIC
Bilirubin Urine: NEGATIVE
KETONES UR: NEGATIVE mg/dL
NITRITE: NEGATIVE
PROTEIN: NEGATIVE mg/dL
Specific Gravity, Urine: >1.046 — ABNORMAL HIGH (ref 1.005–1.030)
pH: 5.5 (ref 5.0–8.0)

## 2015-06-17 LAB — URINE MICROSCOPIC-ADD ON

## 2015-06-17 LAB — CBC
HCT: 43.1 % (ref 36.0–46.0)
Hemoglobin: 14.9 g/dL (ref 12.0–15.0)
MCH: 30.7 pg (ref 26.0–34.0)
MCHC: 34.6 g/dL (ref 30.0–36.0)
MCV: 88.7 fL (ref 78.0–100.0)
PLATELETS: 180 10*3/uL (ref 150–400)
RBC: 4.86 MIL/uL (ref 3.87–5.11)
RDW: 12.7 % (ref 11.5–15.5)
WBC: 8.6 10*3/uL (ref 4.0–10.5)

## 2015-06-17 LAB — CBG MONITORING, ED
GLUCOSE-CAPILLARY: 352 mg/dL — AB (ref 65–99)
GLUCOSE-CAPILLARY: 298 mg/dL — AB (ref 65–99)

## 2015-06-17 MED ORDER — SODIUM CHLORIDE 0.9 % IV BOLUS (SEPSIS)
1000.0000 mL | Freq: Once | INTRAVENOUS | Status: AC
  Administered 2015-06-17: 1000 mL via INTRAVENOUS

## 2015-06-17 MED ORDER — METFORMIN HCL 500 MG PO TABS: 500.0000 mg | ORAL_TABLET | Freq: Two times a day (BID) | ORAL | Status: DC

## 2015-06-17 MED ORDER — SULFAMETHOXAZOLE-TRIMETHOPRIM 800-160 MG PO TABS
1.0000 | ORAL_TABLET | Freq: Two times a day (BID) | ORAL | Status: DC
Start: 2015-06-17 — End: 2015-06-17

## 2015-06-17 MED ORDER — FLUCONAZOLE 150 MG PO TABS
150.0000 mg | ORAL_TABLET | Freq: Once | ORAL | Status: AC
  Administered 2015-06-17: 150 mg via ORAL
  Filled 2015-06-17: qty 1

## 2015-06-17 MED ORDER — INSULIN ASPART PROT & ASPART (70-30 MIX) 100 UNIT/ML ~~LOC~~ SUSP: 10.0000 [IU] | Freq: Two times a day (BID) | SUBCUTANEOUS | Status: DC

## 2015-06-17 MED ORDER — SULFAMETHOXAZOLE-TRIMETHOPRIM 800-160 MG PO TABS
1.0000 | ORAL_TABLET | Freq: Two times a day (BID) | ORAL | Status: AC
Start: 2015-06-17 — End: 2015-06-24

## 2015-06-17 MED ORDER — INSULIN ASPART 100 UNIT/ML ~~LOC~~ SOLN
10.0000 [IU] | Freq: Once | SUBCUTANEOUS | Status: AC
  Administered 2015-06-17: 10 [IU] via SUBCUTANEOUS
  Filled 2015-06-17: qty 1

## 2015-06-17 NOTE — ED Notes (Signed)
Bed: WA09 Expected date:  Expected time:  Means of arrival:  Comments: EMS hyperglycemia 

## 2015-06-17 NOTE — ED Notes (Signed)
[place]. Pt is homeless. C/O hyperglycemia. Denies nausea and vomiting, but is c/o constipation. States she ran out of her insulin weeks ago. Pt ambulated to room without difficulty.  EMS CBG 569 BP130/80, HR60

## 2015-06-17 NOTE — Discharge Instructions (Signed)
Take bactrim as treatment for your urinary tract infection.  Use your insulin and metformin as management of your diabetes.  Follow up with your doctor for further care.    Hyperglycemia High blood sugar (hyperglycemia) means that the level of sugar in your blood is higher than it should be. Signs of high blood sugar include:  Feeling thirsty.  Frequent peeing (urinating).  Feeling tired or sleepy.  Dry mouth.  Vision changes.  Feeling weak.  Feeling hungry but losing weight.  Numbness and tingling in your hands or feet.  Headache. When you ignore these signs, your blood sugar may keep going up. These problems may get worse, and other problems may begin. HOME CARE  Check your blood sugars as told by your doctor. Write down the numbers with the date and time.  Take the right amount of insulin or diabetes pills at the right time. Write down the dose with date and time.  Refill your insulin or diabetes pills before running out.  Watch what you eat. Follow your meal plan.  Drink liquids without sugar, such as water. Check with your doctor if you have kidney or heart disease.  Follow your doctor's orders for exercise. Exercise at the same time of day.  Keep your doctor's appointments. GET HELP RIGHT AWAY IF:   You have trouble thinking or are confused.  You have fast breathing with fruity smelling breath.  You pass out (faint).  You have 2 to 3 days of high blood sugars and you do not know why.  You have chest pain.  You are feeling sick to your stomach (nauseous) or throwing up (vomiting).  You have sudden vision changes. MAKE SURE YOU:   Understand these instructions.  Will watch your condition.  Will get help right away if you are not doing well or get worse.   This information is not intended to replace advice given to you by your health care provider. Make sure you discuss any questions you have with your health care provider.   Document Released:  01/04/2009 Document Revised: 03/30/2014 Document Reviewed: 11/13/2014 Elsevier Interactive Patient Education 2016 Else[employer]  Urinary Tract Infection A urinary tract infection (UTI) can occur any place along the urinary tract. The tract includes the kidneys, ureters, bladder, and urethra. A type of germ called bacteria often causes a UTI. UTIs are often helped with antibiotic medicine.  HOME CARE   If given, take antibiotics as told by your doctor. Finish them even if you start to feel better.  Drink enough fluids to keep your pee (urine) clear or pale yellow.  Avoid tea, drinks with caffeine, and bubbly (carbonated) drinks.  Pee often. Avoid holding your pee in for a long time.  Pee before and after having sex (intercourse).  Wipe from front to back after you poop (bowel movement) if you are a woman. Use each tissue only once. GET HELP RIGHT AWAY IF:   You have back pain.  You have lower belly (abdominal) pain.  You have chills.  You feel sick to your stomach (nauseous).  You throw up (vomit).  Your burning or discomfort with peeing does not go away.  You have a fever.  Your symptoms are not better in 3 days. MAKE SURE YOU:   Understand these instructions.  Will watch your condition.  Will get help right away if you are not doing well or get worse.   This information is not intended to replace advice given to you by your health care  provider. Make sure you discuss any questions you have with your health care provider.   Document Released: 08/26/2007 Document Revised: 03/30/2014 Document Reviewed: 10/08/2011 Elsevier Interactive Patient Education [employer].

## 2015-06-17 NOTE — ED Provider Notes (Signed)
CSN: [id number]     Arrival date & time 06/17/15  0411 History   First MD Initiated Contact with Patient 06/17/15 [phone]     Chief Complaint  Patient presents with  . Hyperglycemia  . Pelvic Pain     (Consider location/radiation/quality/duration/timing/severity/associated sxs/prior Treatment) HPI   50 year old obese female who is homeless with significant history of schizophrenia, polysubstance abuse, hypertension, diabetes brought here via EMS with complaints of dizziness, dysuria, and abdominal pain. Patient report for the past week she has noticed increased urinary frequency, urgency, with burning on urination. She also complains of vaginal itchiness that she has been using Monistat with some. She endorse occasional diffuse abdominal cramping sensation without any significant abdominal pain at this time. She admits that her blood sugar has been running high due to lack of insulin for the past 3 weeks because she does not have the money to afford it and currently waiting for her paycheck. She is homeless but does try to monitor. As best as she could. Today while walking to her 10, she felt dizzy and decided to come to the ER for further management. Otherwise patient denies having fever, headache, vision changes, neck pain, chest pain, shortness of breath, back pain, vaginal discharge, hematuria. Patient mentioned she has not been sexually active for at least 8 months. Patient states she will get pain week and should be able to get her medications.  Past Medical History  Diagnosis Date  . Hypertension   . Seizures ([place])   . COPD (chronic obstructive pulmonary disease) ([place])   . Diabetes mellitus without complication ([place])   . Sleep apnea   . Coronary artery disease   . Schizophrenia ([place])   . Migraine   . Addiction to drug ([place])   . Obesity   . Cancer (HCC) throat  . Throat cancer [facility])    Past Surgical History  Procedure Laterality Date  . Abdominal hysterectomy    . Appendectomy     . Cholecystectomy    . Eye surgery    . Cesarean section    . Knee surgery     Family History  Problem Relation Age of Onset  . Cancer Father    Social History  Substance Use Topics  . Smoking status: Current Every Day Smoker -- 0.50 packs/day for 30 years    Types: Cigarettes  . Smokeless tobacco: Never Used  . Alcohol Use: No   OB History    No data available     Review of Systems  All other systems reviewed and are negative.     Allergies  Aspirin; Bee venom; Other; Toradol; Naproxen; Tramadol; Penicillins; and Vancomycin  Home Medications   Prior to Admission medications   Medication Sig Start Date End Date Taking? Authorizing Provider  albuterol (PROVENTIL HFA;VENTOLIN HFA) 108 (90 BASE) MCG/ACT inhaler Inhale 2 puffs into the lungs every 6 (six) hours as needed. Reported on 03/06/2015 10/27/10   Historical Provider, MD  clindamycin (CLEOCIN) 150 MG capsule Take 3 capsules (450 mg total) by mouth 3 (three) times daily. Patient not taking: Reported on 03/06/2015 01/21/15   Voncille Lo, MD  clindamycin (CLEOCIN) 300 MG capsule Take 1 capsule (300 mg total) by mouth 3 (three) times daily. X 7 days 06/02/15   Charlesetta Shanks, MD  cyclobenzaprine (FLEXERIL) 10 MG tablet Take 1 tablet (10 mg total) by mouth 3 (three) times daily as needed for muscle spasms. 12/15/13   Clayton Bibles, PA-C  divalproex (DEPAKOTE ER) 500 MG 24 hr tablet  Take 1 tablet (500 mg total) by mouth 2 (two) times daily. 04/01/15   Julianne Rice, MD  erythromycin ophthalmic ointment Place a 1/2 inch ribbon of ointment into the lower eyelid. Patient taking differently: Place 1 application into both eyes daily as needed (infection). Place a 1/2 inch ribbon of ointment into the lower eyelid. 01/21/15   Voncille Lo, MD  Guaifenesin 1200 MG TB12 Take 1 tablet (1,200 mg total) by mouth 2 (two) times daily. Patient not taking: Reported on 03/06/2015 12/09/14   Dalia Heading, PA-C  insulin aspart protamine-  aspart (NOVOLOG MIX 70/30) (70-30) 100 UNIT/ML injection Inject 10 Units into the skin 2 (two) times daily with a meal. Reported on 04/01/2015    Historical Provider, MD  metFORMIN (GLUCOPHAGE) 500 MG tablet Take 1 tablet (500 mg total) by mouth 2 (two) times daily with a meal. 10/06/14   Linton Flemings, MD  nitroGLYCERIN (NITROSTAT) 0.4 MG SL tablet Place 0.4 mg under the tongue every 5 (five) minutes as needed for chest pain.    Historical Provider, MD  predniSONE (DELTASONE) 10 MG tablet Take 2 tablets (20 mg total) by mouth daily. Patient not taking: Reported on 03/06/2015 12/09/14   Dalia Heading, PA-C   BP 137/95 mmHg  Pulse 70  Temp(Src) 98.3 F (36.8 C) (Oral)  Resp 20  SpO2 98% Physical Exam  Constitutional: She is oriented to person, place, and time. She appears well-developed and well-nourished. No distress.  Obese Caucasian female nontoxic in appearance  HENT:  Head: Atraumatic.  Mouth/Throat: Oropharynx is clear and moist.  Eyes: Conjunctivae are normal.  Neck: Neck supple.  Cardiovascular: Normal rate and regular rhythm.   Pulmonary/Chest: Effort normal and breath sounds normal.  Abdominal: Soft. There is tenderness (Mild suprapubic tenderness without guarding or rebound tenderness. Negative Murphy sign, no pain at McBurney's point.).  Neurological: She is alert and oriented to person, place, and time.  Skin: No rash noted.  Psychiatric: She has a normal mood and affect.  Nursing note and vitals reviewed.   ED Course  Procedures (including critical care time) Labs Review Labs Reviewed  BASIC METABOLIC PANEL - Abnormal; Notable for the following:    Glucose, Bld 382 (*)    All other components within normal limits  URINALYSIS, ROUTINE W REFLEX MICROSCOPIC (NOT AT [facility]) - Abnormal; Notable for the following:    APPearance CLOUDY (*)    Specific Gravity, Urine >1.046 (*)    Glucose, UA >1000 (*)    Hgb urine dipstick SMALL (*)    Leukocytes, UA SMALL (*)    All other  components within normal limits  URINE MICROSCOPIC-ADD ON - Abnormal; Notable for the following:    Squamous Epithelial / LPF 0-5 (*)    Bacteria, UA FEW (*)    All other components within normal limits  CBG MONITORING, ED - Abnormal; Notable for the following:    Glucose-Capillary 352 (*)    All other components within normal limits  CBG MONITORING, ED - Abnormal; Notable for the following:    Glucose-Capillary 298 (*)    All other components within normal limits  CBC    Imaging Review No results found. I have personally reviewed and evaluated these images and lab results as part of my medical decision-making.   EKG Interpretation None      MDM   Final diagnoses:  Hyperglycemia  UTI (lower urinary tract infection)    BP 137/95 mmHg  Pulse 70  Temp(Src) 98.3 F (36.8 C) (Oral)  Resp 20  SpO2 98%   4:57 AM Patient here due to being without her insulin for the more than 3 weeks and was concerned about hypoglycemia. She also complaining of having some dysuria concerning for UTI. Patient has a documented CBG of 352 today without any anion gap. Her urine showed signs of possible urinary tract infection which she will benefit from treatment. She is also concerning for yeast infection, I will give Diflucan 150mg  PO once as treatment.   6:07 AM CBG improves with IVF and insulin.  UA with signs of UTI.  Pt d/c with Keflex and i will refill her diabetic medications.  Pt recommend to f/u with PCP for further care.    Domenic Moras, PA-C 06/17/15 [id number]  Orpah Greek, MD 06/17/15 [phone]

## 2015-06-17 NOTE — ED Notes (Signed)
During triage, pt expressed that she is also experiencing pelvic pain and burning with urination

## 2015-08-05 ENCOUNTER — Other Ambulatory Visit: Payer: Self-pay

## 2015-08-05 DIAGNOSIS — Z1231 Encounter for screening mammogram for malignant neoplasm of breast: Secondary | ICD-10-CM

## 2015-08-13 ENCOUNTER — Ambulatory Visit: Payer: Medicaid Other

## 2015-10-04 ENCOUNTER — Encounter (HOSPITAL_COMMUNITY): Payer: Self-pay | Admitting: Emergency Medicine

## 2015-10-04 ENCOUNTER — Emergency Department (HOSPITAL_COMMUNITY)
Admission: EM | Admit: 2015-10-04 | Discharge: 2015-10-04 | Disposition: A | Discharge status: Home / Self Care | Payer: Medicaid Other | Attending: Emergency Medicine | Admitting: Emergency Medicine

## 2015-10-04 DIAGNOSIS — I2510 Atherosclerotic heart disease of native coronary artery without angina pectoris: Secondary | ICD-10-CM | POA: Insufficient documentation

## 2015-10-04 DIAGNOSIS — Z8589 Personal history of malignant neoplasm of other organs and systems: Secondary | ICD-10-CM | POA: Insufficient documentation

## 2015-10-04 DIAGNOSIS — J449 Chronic obstructive pulmonary disease, unspecified: Secondary | ICD-10-CM | POA: Insufficient documentation

## 2015-10-04 DIAGNOSIS — Z7984 Long term (current) use of oral hypoglycemic drugs: Secondary | ICD-10-CM | POA: Diagnosis not present

## 2015-10-04 DIAGNOSIS — E119 Type 2 diabetes mellitus without complications: Secondary | ICD-10-CM | POA: Diagnosis not present

## 2015-10-04 DIAGNOSIS — I10 Essential (primary) hypertension: Secondary | ICD-10-CM | POA: Insufficient documentation

## 2015-10-04 DIAGNOSIS — F17210 Nicotine dependence, cigarettes, uncomplicated: Secondary | ICD-10-CM | POA: Insufficient documentation

## 2015-10-04 DIAGNOSIS — N390 Urinary tract infection, site not specified: Secondary | ICD-10-CM | POA: Insufficient documentation

## 2015-10-04 DIAGNOSIS — Z794 Long term (current) use of insulin: Secondary | ICD-10-CM | POA: Diagnosis not present

## 2015-10-04 DIAGNOSIS — R350 Frequency of micturition: Secondary | ICD-10-CM | POA: Diagnosis present

## 2015-10-04 LAB — URINE MICROSCOPIC-ADD ON

## 2015-10-04 LAB — HIV ANTIBODY (ROUTINE TESTING W REFLEX): HIV Screen 4th Generation wRfx: NONREACTIVE

## 2015-10-04 LAB — GC/CHLAMYDIA PROBE AMP (~~LOC~~) NOT AT ARMC
Chlamydia: NEGATIVE
Neisseria Gonorrhea: NEGATIVE

## 2015-10-04 LAB — RPR: RPR Ser Ql: NONREACTIVE

## 2015-10-04 LAB — URINALYSIS, ROUTINE W REFLEX MICROSCOPIC
BILIRUBIN URINE: NEGATIVE
KETONES UR: 15 mg/dL — AB
Nitrite: NEGATIVE
PH: 5.5 (ref 5.0–8.0)
Protein, ur: NEGATIVE mg/dL
Specific Gravity, Urine: 1.038 — ABNORMAL HIGH (ref 1.005–1.030)

## 2015-10-04 LAB — POC URINE PREG, ED: Preg Test, Ur: NEGATIVE

## 2015-10-04 LAB — WET PREP, GENITAL
Sperm: NONE SEEN
Trich, Wet Prep: NONE SEEN
Yeast Wet Prep HPF POC: NONE SEEN

## 2015-10-04 MED ORDER — CEPHALEXIN 250 MG PO CAPS
500.0000 mg | ORAL_CAPSULE | Freq: Once | ORAL | Status: AC
  Administered 2015-10-04: 500 mg via ORAL
  Filled 2015-10-04: qty 2

## 2015-10-04 MED ORDER — CEPHALEXIN 500 MG PO CAPS: 500.0000 mg | ORAL_CAPSULE | Freq: Two times a day (BID) | ORAL | Status: DC

## 2015-10-04 MED ORDER — NYSTATIN 100000 UNIT/GM EX CREA: TOPICAL_CREAM | CUTANEOUS | Status: DC

## 2015-10-04 NOTE — ED Notes (Signed)
Per pt, she has had urinary frequency, burning when urinating and vaginal itching x 1 day.

## 2015-10-04 NOTE — ED Notes (Signed)
Pt states she understands instructions. Pty home stable with with steady gait and bus pass.

## 2015-10-04 NOTE — Discharge Instructions (Signed)
Medications: Keflex, nystatin  Treatment: Take Keflex twice daily for 1 week for your urinary tract infection. Please return to the emergency department immediately if you develop any rash, difficulty breathing, feeling of throat closing. Apply nystatin cream to rash under breast twice daily until resolution of your symptoms. Drink plenty of water, at least 8 glasses of water daily. You will be called in 2-3 days if any of your cultures return positive. If anything is positive, you should seek treatment at the health department or your primary care provider. You should also make any of your sexual partners aware of any positive results and that they will need treatment as well.  Follow-up: Please follow-up with your primary care provider at [place] as soon as possible for follow-up of today's visit and to resume seizure medication. Please return to emergency room immediately if you develop any new or worsening symptoms.    Urinary Tract Infection Urinary tract infections (UTIs) can develop anywhere along your urinary tract. Your urinary tract is your body's drainage system for removing wastes and extra water. Your urinary tract includes two kidneys, two ureters, a bladder, and a urethra. Your kidneys are a pair of bean-shaped organs. Each kidney is about the size of your fist. They are located below your ribs, one on each side of your spine. CAUSES Infections are caused by microbes, which are microscopic organisms, including fungi, viruses, and bacteria. These organisms are so small that they can only be seen through a microscope. Bacteria are the microbes that most commonly cause UTIs. SYMPTOMS  Symptoms of UTIs may vary by age and gender of the patient and by the location of the infection. Symptoms in young women typically include a frequent and intense urge to urinate and a painful, burning feeling in the bladder or urethra during urination. Older women and men are more likely to be  tired, shaky, and weak and have muscle aches and abdominal pain. A fever may mean the infection is in your kidneys. Other symptoms of a kidney infection include pain in your back or sides below the ribs, nausea, and vomiting. DIAGNOSIS To diagnose a UTI, your caregiver will ask you about your symptoms. Your caregiver will also ask you to provide a urine sample. The urine sample will be tested for bacteria and white blood cells. White blood cells are made by your body to help fight infection. TREATMENT  Typically, UTIs can be treated with medication. Because most UTIs are caused by a bacterial infection, they usually can be treated with the use of antibiotics. The choice of antibiotic and length of treatment depend on your symptoms and the type of bacteria causing your infection. HOME CARE INSTRUCTIONS  If you were prescribed antibiotics, take them exactly as your caregiver instructs you. Finish the medication even if you feel better after you have only taken some of the medication.  Drink enough water and fluids to keep your urine clear or pale yellow.  Avoid caffeine, tea, and carbonated beverages. They tend to irritate your bladder.  Empty your bladder often. Avoid holding urine for long periods of time.  Empty your bladder before and after sexual intercourse.  After a bowel movement, women should cleanse from front to back. Use each tissue only once. SEEK MEDICAL CARE IF:   You have back pain.  You develop a fever.  Your symptoms do not begin to resolve within 3 days. SEEK IMMEDIATE MEDICAL CARE IF:   You have severe back pain or lower abdominal pain.  You develop chills.  You have nausea or vomiting.  You have continued burning or discomfort with urination. MAKE SURE YOU:   Understand these instructions.  Will watch your condition.  Will get help right away if you are not doing well or get worse.   This information is not intended to replace advice given to you by your  health care provider. Make sure you discuss any questions you have with your health care provider.   Document Released: 12/17/2004 Document Revised: 11/28/2014 Document Reviewed: 04/17/2011 Elsevier Interactive Patient Education [employer].
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
## 2015-10-05 ENCOUNTER — Emergency Department (HOSPITAL_COMMUNITY)
Admission: EM | Admit: 2015-10-05 | Discharge: 2015-10-05 | Disposition: A | Discharge status: Home / Self Care | Payer: Medicaid Other | Attending: Emergency Medicine | Admitting: Emergency Medicine

## 2015-10-05 ENCOUNTER — Encounter (HOSPITAL_COMMUNITY): Payer: Self-pay

## 2015-10-05 ENCOUNTER — Emergency Department (HOSPITAL_COMMUNITY): Payer: Medicaid Other

## 2015-10-05 DIAGNOSIS — Z7984 Long term (current) use of oral hypoglycemic drugs: Secondary | ICD-10-CM | POA: Insufficient documentation

## 2015-10-05 DIAGNOSIS — I2510 Atherosclerotic heart disease of native coronary artery without angina pectoris: Secondary | ICD-10-CM | POA: Diagnosis not present

## 2015-10-05 DIAGNOSIS — J449 Chronic obstructive pulmonary disease, unspecified: Secondary | ICD-10-CM | POA: Insufficient documentation

## 2015-10-05 DIAGNOSIS — Z79899 Other long term (current) drug therapy: Secondary | ICD-10-CM | POA: Diagnosis not present

## 2015-10-05 DIAGNOSIS — Z85818 Personal history of malignant neoplasm of other sites of lip, oral cavity, and pharynx: Secondary | ICD-10-CM | POA: Insufficient documentation

## 2015-10-05 DIAGNOSIS — I10 Essential (primary) hypertension: Secondary | ICD-10-CM | POA: Diagnosis not present

## 2015-10-05 DIAGNOSIS — M25511 Pain in right shoulder: Secondary | ICD-10-CM | POA: Insufficient documentation

## 2015-10-05 DIAGNOSIS — Z7952 Long term (current) use of systemic steroids: Secondary | ICD-10-CM | POA: Insufficient documentation

## 2015-10-05 DIAGNOSIS — Y929 Unspecified place or not applicable: Secondary | ICD-10-CM | POA: Diagnosis not present

## 2015-10-05 DIAGNOSIS — Y939 Activity, unspecified: Secondary | ICD-10-CM | POA: Insufficient documentation

## 2015-10-05 DIAGNOSIS — F1490 Cocaine use, unspecified, uncomplicated: Secondary | ICD-10-CM | POA: Diagnosis not present

## 2015-10-05 DIAGNOSIS — F209 Schizophrenia, unspecified: Secondary | ICD-10-CM | POA: Diagnosis not present

## 2015-10-05 DIAGNOSIS — F17210 Nicotine dependence, cigarettes, uncomplicated: Secondary | ICD-10-CM | POA: Diagnosis not present

## 2015-10-05 DIAGNOSIS — Z794 Long term (current) use of insulin: Secondary | ICD-10-CM | POA: Insufficient documentation

## 2015-10-05 DIAGNOSIS — Z792 Long term (current) use of antibiotics: Secondary | ICD-10-CM | POA: Diagnosis not present

## 2015-10-05 DIAGNOSIS — E119 Type 2 diabetes mellitus without complications: Secondary | ICD-10-CM | POA: Insufficient documentation

## 2015-10-05 LAB — BASIC METABOLIC PANEL
ANION GAP: 10 (ref 5–15)
BUN: 15 mg/dL (ref 6–20)
CALCIUM: 9.9 mg/dL (ref 8.9–10.3)
CO2: 25 mmol/L (ref 22–32)
CREATININE: 0.66 mg/dL (ref 0.44–1.00)
Chloride: 103 mmol/L (ref 101–111)
Glucose, Bld: 376 mg/dL — ABNORMAL HIGH (ref 65–99)
Potassium: 4 mmol/L (ref 3.5–5.1)
SODIUM: 138 mmol/L (ref 135–145)

## 2015-10-05 LAB — CBC WITH DIFFERENTIAL/PLATELET
BASOS ABS: 0 10*3/uL (ref 0.0–0.1)
BASOS PCT: 0 %
EOS ABS: 0 10*3/uL (ref 0.0–0.7)
EOS PCT: 1 %
HCT: 42.5 % (ref 36.0–46.0)
Hemoglobin: 14.5 g/dL (ref 12.0–15.0)
Lymphocytes Relative: 38 %
Lymphs Abs: 3 10*3/uL (ref 0.7–4.0)
MCH: 30.5 pg (ref 26.0–34.0)
MCHC: 34.1 g/dL (ref 30.0–36.0)
MCV: 89.3 fL (ref 78.0–100.0)
Monocytes Absolute: 0.7 10*3/uL (ref 0.1–1.0)
Monocytes Relative: 8 %
NEUTROS PCT: 53 %
Neutro Abs: 4.2 10*3/uL (ref 1.7–7.7)
PLATELETS: 174 10*3/uL (ref 150–400)
RBC: 4.76 MIL/uL (ref 3.87–5.11)
RDW: 12.9 % (ref 11.5–15.5)
WBC: 7.9 10*3/uL (ref 4.0–10.5)

## 2015-10-05 LAB — RAPID URINE DRUG SCREEN, HOSP PERFORMED
AMPHETAMINES: NOT DETECTED
BARBITURATES: NOT DETECTED
Benzodiazepines: NOT DETECTED
Cocaine: NOT DETECTED
Opiates: NOT DETECTED
TETRAHYDROCANNABINOL: NOT DETECTED

## 2015-10-05 LAB — URINALYSIS, ROUTINE W REFLEX MICROSCOPIC
Bilirubin Urine: NEGATIVE
KETONES UR: NEGATIVE mg/dL
LEUKOCYTES UA: NEGATIVE
Nitrite: NEGATIVE
PROTEIN: NEGATIVE mg/dL
Specific Gravity, Urine: 1.042 — ABNORMAL HIGH (ref 1.005–1.030)
pH: 5.5 (ref 5.0–8.0)

## 2015-10-05 LAB — VALPROIC ACID LEVEL

## 2015-10-05 LAB — URINE MICROSCOPIC-ADD ON
Bacteria, UA: NONE SEEN
WBC UA: NONE SEEN WBC/hpf (ref 0–5)

## 2015-10-05 LAB — URINE CULTURE

## 2015-10-05 LAB — I-STAT TROPONIN, ED: Troponin i, poc: 0 ng/mL (ref 0.00–0.08)

## 2015-10-05 LAB — I-STAT CG4 LACTIC ACID, ED: LACTIC ACID, VENOUS: 2.49 mmol/L — AB (ref 0.5–1.9)

## 2015-10-05 LAB — ETHANOL

## 2015-10-05 MED ORDER — DIVALPROEX SODIUM ER 500 MG PO TB24: 500.0000 mg | ORAL_TABLET | Freq: Two times a day (BID) | ORAL | Status: DC

## 2015-10-05 MED ORDER — DIVALPROEX SODIUM ER 500 MG PO TB24
500.0000 mg | ORAL_TABLET | Freq: Once | ORAL | Status: AC
  Administered 2015-10-05: 500 mg via ORAL
  Filled 2015-10-05: qty 1

## 2015-10-05 MED ORDER — SODIUM CHLORIDE 0.9 % IV BOLUS (SEPSIS)
500.0000 mL | Freq: Once | INTRAVENOUS | Status: AC
  Administered 2015-10-05: 500 mL via INTRAVENOUS

## 2015-10-05 MED ORDER — ONDANSETRON HCL 4 MG/2ML IJ SOLN
4.0000 mg | Freq: Once | INTRAMUSCULAR | Status: AC
  Administered 2015-10-05: 4 mg via INTRAVENOUS
  Filled 2015-10-05: qty 2

## 2015-10-05 MED ORDER — VALPROATE SODIUM 500 MG/5ML IV SOLN: 20.0000 mg/kg | Freq: Once | INTRAVENOUS | Status: DC

## 2015-10-05 MED ORDER — MORPHINE SULFATE (PF) 4 MG/ML IV SOLN
4.0000 mg | Freq: Once | INTRAVENOUS | Status: AC
  Administered 2015-10-05: 4 mg via INTRAVENOUS
  Filled 2015-10-05: qty 1

## 2015-10-05 NOTE — ED Notes (Signed)
Bed: [id number] Expected date:  Expected time:  Means of arrival:  Comments: 50 seizures w/ hx
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
## 2015-10-05 NOTE — ED Notes (Signed)
Pt given orange juice w/ PA's permission.  Pt directed not to take home medications.

## 2015-11-29 ENCOUNTER — Emergency Department (HOSPITAL_COMMUNITY)
Admission: EM | Admit: 2015-11-29 | Discharge: 2015-11-29 | Disposition: A | Discharge status: Home / Self Care | Payer: Medicaid Other | Attending: Emergency Medicine | Admitting: Emergency Medicine

## 2015-11-29 ENCOUNTER — Emergency Department (HOSPITAL_COMMUNITY): Payer: Medicaid Other

## 2015-11-29 ENCOUNTER — Encounter (HOSPITAL_COMMUNITY): Payer: Self-pay | Admitting: Emergency Medicine

## 2015-11-29 DIAGNOSIS — E119 Type 2 diabetes mellitus without complications: Secondary | ICD-10-CM | POA: Insufficient documentation

## 2015-11-29 DIAGNOSIS — I2510 Atherosclerotic heart disease of native coronary artery without angina pectoris: Secondary | ICD-10-CM | POA: Diagnosis not present

## 2015-11-29 DIAGNOSIS — S80212A Abrasion, left knee, initial encounter: Secondary | ICD-10-CM | POA: Diagnosis not present

## 2015-11-29 DIAGNOSIS — Z85818 Personal history of malignant neoplasm of other sites of lip, oral cavity, and pharynx: Secondary | ICD-10-CM | POA: Insufficient documentation

## 2015-11-29 DIAGNOSIS — Y929 Unspecified place or not applicable: Secondary | ICD-10-CM | POA: Diagnosis not present

## 2015-11-29 DIAGNOSIS — Y939 Activity, unspecified: Secondary | ICD-10-CM | POA: Diagnosis not present

## 2015-11-29 DIAGNOSIS — S8992XA Unspecified injury of left lower leg, initial encounter: Secondary | ICD-10-CM | POA: Diagnosis present

## 2015-11-29 DIAGNOSIS — J449 Chronic obstructive pulmonary disease, unspecified: Secondary | ICD-10-CM | POA: Diagnosis not present

## 2015-11-29 DIAGNOSIS — S80211A Abrasion, right knee, initial encounter: Secondary | ICD-10-CM | POA: Insufficient documentation

## 2015-11-29 DIAGNOSIS — Y999 Unspecified external cause status: Secondary | ICD-10-CM | POA: Insufficient documentation

## 2015-11-29 DIAGNOSIS — S80219A Abrasion, unspecified knee, initial encounter: Secondary | ICD-10-CM

## 2015-11-29 DIAGNOSIS — W1839XA Other fall on same level, initial encounter: Secondary | ICD-10-CM | POA: Diagnosis not present

## 2015-11-29 DIAGNOSIS — I10 Essential (primary) hypertension: Secondary | ICD-10-CM | POA: Diagnosis not present

## 2015-11-29 DIAGNOSIS — F17210 Nicotine dependence, cigarettes, uncomplicated: Secondary | ICD-10-CM | POA: Insufficient documentation

## 2015-11-29 MED ORDER — HYDROCODONE-ACETAMINOPHEN 5-325 MG PO TABS
1.0000 | ORAL_TABLET | Freq: Once | ORAL | Status: AC
  Administered 2015-11-29: 1 via ORAL
  Filled 2015-11-29: qty 1

## 2015-11-29 NOTE — Discharge Instructions (Signed)
Take over-the-counter pain medications as needed. Apply ice to help with the pain. You can consider seeing an orthopedic doctor for further evaluation

## 2015-11-29 NOTE — ED Provider Notes (Signed)
[facility] DEPT Provider Note   CSN: [id number] Arrival date & time: 11/29/15  1556     History   Chief Complaint Chief Complaint  Patient presents with  . Fall  . Knee Pain    HPI Carol Hughes is a 50 y.o. female.  HPI The patient fell today after her right knee gave out. She is now having pain in both of her knees. The patient states she has a history of bone spurs. She has not been able to see an orthopedic doctor. Patient states after the fall she is having severe pain. She does not feel like she can walk on her legs. She denies any numbness or weakness. No syncope. No head injury. Past Medical History:  Diagnosis Date  . Addiction to drug ([place])   . Cancer (HCC) throat  . COPD (chronic obstructive pulmonary disease) ([place])   . Coronary artery disease   . Diabetes mellitus without complication ([place])   . Hypertension   . Migraine   . Obesity   . Schizophrenia ([place])   . Seizures ([place])   . Sleep apnea   . Throat cancer [facility])     Patient Active Problem List   Diagnosis Date Noted  . Mastitis in female 07/05/2012  . Dehydration 07/05/2012  . Seizure disorder ([place]) 07/05/2012  . Chest pain 05/04/2012  . COPD (chronic obstructive pulmonary disease) ([place]) 05/04/2012  . Diabetes mellitus, type 2 ([place]) 05/04/2012  . Bipolar disorder ([place]) 05/04/2012    Past Surgical History:  Procedure Laterality Date  . ABDOMINAL HYSTERECTOMY    . APPENDECTOMY    . CESAREAN SECTION    . CHOLECYSTECTOMY    . EYE SURGERY    . KNEE SURGERY      OB History    No data available       Home Medications    Prior to Admission medications   Medication Sig Start Date End Date Taking? Authorizing Provider  albuterol (PROVENTIL HFA;VENTOLIN HFA) 108 (90 BASE) MCG/ACT inhaler Inhale 2 puffs into the lungs every 6 (six) hours as needed. Reported on 03/06/2015 10/27/10  Yes Historical Provider, MD  citalopram (CELEXA) 40 MG tablet Take 40 mg by mouth daily.   Yes Historical  Provider, MD  divalproex (DEPAKOTE ER) 500 MG 24 hr tablet Take 1 tablet (500 mg total) by mouth 2 (two) times daily. 10/05/15  Yes Shawn C Joy, PA-C  insulin aspart protamine- aspart (NOVOLOG MIX 70/30) (70-30) 100 UNIT/ML injection Inject 0.1 mLs (10 Units total) into the skin 2 (two) times daily with a meal. Reported on 04/01/2015 06/17/15  Yes Domenic Moras, PA-C  insulin glargine (LANTUS) 100 UNIT/ML injection Inject 20 Units into the skin 2 (two) times daily.   Yes Historical Provider, MD  lisinopril-hydrochlorothiazide (PRINZIDE,ZESTORETIC) 20-25 MG tablet Take 1 tablet by mouth daily.   Yes Historical Provider, MD  metFORMIN (GLUCOPHAGE) 500 MG tablet Take 1 tablet (500 mg total) by mouth 2 (two) times daily with a meal. 06/17/15  Yes Domenic Moras, PA-C  nitroGLYCERIN (NITROSTAT) 0.4 MG SL tablet Place 0.4 mg under the tongue every 5 (five) minutes as needed for chest pain.   Yes Historical Provider, MD  nystatin cream (MYCOSTATIN) Apply to affected area 2 times daily until resolution of symptoms 10/04/15  Yes Alexandra M Law, PA-C  cephALEXin (KEFLEX) 500 MG capsule Take 1 capsule (500 mg total) by mouth 2 (two) times daily. 10/04/15   Frederica Kuster, PA-C  clindamycin (CLEOCIN) 150 MG capsule Take 3 capsules (  450 mg total) by mouth 3 (three) times daily. Patient not taking: Reported on 03/06/2015 01/21/15   Voncille Lo, MD  clindamycin (CLEOCIN) 300 MG capsule Take 1 capsule (300 mg total) by mouth 3 (three) times daily. X 7 days Patient not taking: Reported on 06/17/2015 06/02/15   Charlesetta Shanks, MD  divalproex (DEPAKOTE ER) 500 MG 24 hr tablet Take 1 tablet (500 mg total) by mouth 2 (two) times daily. Patient not taking: Reported on 06/17/2015 04/01/15   Julianne Rice, MD  erythromycin ophthalmic ointment Place a 1/2 inch ribbon of ointment into the lower eyelid. Patient not taking: Reported on 06/17/2015 01/21/15   Voncille Lo, MD  predniSONE (DELTASONE) 10 MG tablet Take 2 tablets (20 mg total) by  mouth daily. Patient not taking: Reported on 03/06/2015 12/09/14   Dalia Heading, PA-C    Family History Family History  Problem Relation Age of Onset  . Cancer Father     Social History Social History  Substance Use Topics  . Smoking status: Current Every Day Smoker    Packs/day: 0.50    Years: 30.00    Types: Cigarettes  . Smokeless tobacco: Never Used  . Alcohol use No     Allergies   Aspirin; Bee venom; Other; Toradol [ketorolac tromethamine]; Naproxen; Tramadol; Penicillins; and Vancomycin   Review of Systems Review of Systems  All other systems reviewed and are negative.    Physical Exam Updated Vital Signs BP 143/94 (BP Location: Left Arm)   Pulse 84   Temp 97.9 F (36.6 C) (Oral)   SpO2 98%   Physical Exam  Constitutional:  Patient's pain seems to be out of proportion to the injury she experienced  HENT:  Head: Normocephalic and atraumatic.  Right Ear: External ear normal.  Left Ear: External ear normal.  Eyes: Conjunctivae are normal. Right eye exhibits no discharge. Left eye exhibits no discharge. No scleral icterus.  Neck: Neck supple. No tracheal deviation present.  Cardiovascular: Normal rate.   Pulmonary/Chest: Effort normal. No stridor. No respiratory distress.  Abdominal: She exhibits no distension.  Musculoskeletal: She exhibits no edema.       Right knee: She exhibits no swelling and no effusion. Tenderness found.       Left knee: She exhibits no swelling and no effusion. Tenderness found.  Patient has mild superficial abrasions on her knees, there is no edema, no deformity  Neurological: She is alert. Cranial nerve deficit: no gross deficits.  Skin: Skin is warm and dry. No rash noted.  Psychiatric: She has a normal mood and affect.  Nursing note and vitals reviewed.    ED Treatments / Results  Labs (all labs ordered are listed, but only abnormal results are displayed)    Radiology Dg Knee Complete 4 Views Left  Result  Date: 11/29/2015 CLINICAL DATA:  Sudden onset of weakness in both lower extremities. The patient states she fell to her knees and is unable to walk. EXAM: LEFT KNEE - COMPLETE 4+ VIEW COMPARISON:  Left knee radiographs 01/15/2014 FINDINGS: Minimal degenerative changes in the left may are similar the prior study. No acute bone or soft tissue abnormality is present. There is no significant joint effusion. IMPRESSION: No acute abnormality or significant interval change. Electronically Signed   By: San Morelle M.D.   On: 11/29/2015 16:52   Dg Knee Complete 4 Views Right  Result Date: 11/29/2015 CLINICAL DATA:  [place], bilateral knee pain EXAM: RIGHT KNEE - COMPLETE 4+ VIEW COMPARISON:  None. FINDINGS: Four views  of the right knee submitted. No acute fracture or subluxation. There is diffuse osteopenia. Mild narrowing of medial joint compartment. There is mild spurring of lateral femoral condyle. Mild spurring of patella. Mild narrowing of patellofemoral joint space. IMPRESSION: No acute fracture or subluxation. Diffuse osteopenia. Mild degenerative changes as described above. Electronically Signed   By: Lahoma Crocker M.D.   On: 11/29/2015 16:51    Procedures Procedures (including critical care time)  Medications Ordered in ED Medications  HYDROcodone-acetaminophen (NORCO/VICODIN) 5-325 MG per tablet 1 tablet (1 tablet Oral Given 11/29/15 1700)     Initial Impression / Assessment and Plan / ED Course  I have reviewed the triage vital signs and the nursing notes.  Pertinent labs & imaging results that were available during my care of the patient were reviewed by me and considered in my medical decision making (see chart for details).  Clinical Course   Patient has no focal neurologic deficits on exam. She has mild abrasions. X-rays are negative for acute fracture or dislocation. It is possible that her knees giving way because of a meniscal injury.  She was given crutches in the emergency room. I  recommend follow-up with an orthopedic doctor.  Final Clinical Impressions(s) / ED Diagnoses   Final diagnoses:  Knee abrasion, unspecified laterality, initial encounter    New Prescriptions New Prescriptions   No medications on file     Dorie Rank, MD 11/29/15 [id number]

## 2015-11-29 NOTE — ED Notes (Signed)
Bed: WA17 Expected date:  Expected time:  Means of arrival:  Comments: Per [facility]

## 2015-11-29 NOTE — ED Triage Notes (Signed)
[place] and she fell to both of her knees. Pt states the pain is so bad she cannot walk. Pt states she was recently diagnosed with a bone spur under the knee cap on her right side. Pt states this has been causing her to have multiple falls. Continues to repeat " I just want it out! Medicaid will pay for it!" Pt has service dog for PTSD. Pt very anxious. Rating pain 10/10 at this time.

## 2015-12-06 ENCOUNTER — Emergency Department (HOSPITAL_COMMUNITY)
Admission: EM | Admit: 2015-12-06 | Discharge: 2015-12-06 | Disposition: A | Discharge status: Home / Self Care | Payer: Medicaid Other | Attending: Emergency Medicine | Admitting: Emergency Medicine

## 2015-12-06 ENCOUNTER — Emergency Department (HOSPITAL_COMMUNITY): Payer: Medicaid Other

## 2015-12-06 ENCOUNTER — Encounter (HOSPITAL_COMMUNITY): Payer: Self-pay | Admitting: *Deleted

## 2015-12-06 DIAGNOSIS — Z7984 Long term (current) use of oral hypoglycemic drugs: Secondary | ICD-10-CM | POA: Insufficient documentation

## 2015-12-06 DIAGNOSIS — Z794 Long term (current) use of insulin: Secondary | ICD-10-CM | POA: Insufficient documentation

## 2015-12-06 DIAGNOSIS — W1839XA Other fall on same level, initial encounter: Secondary | ICD-10-CM | POA: Diagnosis not present

## 2015-12-06 DIAGNOSIS — J449 Chronic obstructive pulmonary disease, unspecified: Secondary | ICD-10-CM | POA: Diagnosis not present

## 2015-12-06 DIAGNOSIS — Y9301 Activity, walking, marching and hiking: Secondary | ICD-10-CM | POA: Insufficient documentation

## 2015-12-06 DIAGNOSIS — M545 Low back pain, unspecified: Secondary | ICD-10-CM

## 2015-12-06 DIAGNOSIS — Y999 Unspecified external cause status: Secondary | ICD-10-CM | POA: Diagnosis not present

## 2015-12-06 DIAGNOSIS — E119 Type 2 diabetes mellitus without complications: Secondary | ICD-10-CM | POA: Diagnosis not present

## 2015-12-06 DIAGNOSIS — Z85818 Personal history of malignant neoplasm of other sites of lip, oral cavity, and pharynx: Secondary | ICD-10-CM | POA: Insufficient documentation

## 2015-12-06 DIAGNOSIS — F17210 Nicotine dependence, cigarettes, uncomplicated: Secondary | ICD-10-CM | POA: Insufficient documentation

## 2015-12-06 DIAGNOSIS — I2510 Atherosclerotic heart disease of native coronary artery without angina pectoris: Secondary | ICD-10-CM | POA: Insufficient documentation

## 2015-12-06 DIAGNOSIS — Y929 Unspecified place or not applicable: Secondary | ICD-10-CM | POA: Diagnosis not present

## 2015-12-06 DIAGNOSIS — I10 Essential (primary) hypertension: Secondary | ICD-10-CM | POA: Insufficient documentation

## 2015-12-06 DIAGNOSIS — W19XXXA Unspecified fall, initial encounter: Secondary | ICD-10-CM

## 2015-12-06 LAB — CBG MONITORING, ED: GLUCOSE-CAPILLARY: 207 mg/dL — AB (ref 65–99)

## 2015-12-06 MED ORDER — MORPHINE SULFATE (PF) 2 MG/ML IV SOLN
2.0000 mg | Freq: Once | INTRAVENOUS | Status: AC
  Administered 2015-12-06: 2 mg via INTRAVENOUS
  Filled 2015-12-06: qty 1

## 2015-12-06 MED ORDER — HYDROCODONE-ACETAMINOPHEN 5-325 MG PO TABS: 1.0000 | ORAL_TABLET | ORAL | 0 refills | Status: DC | PRN

## 2015-12-06 MED ORDER — DIAZEPAM 5 MG/ML IJ SOLN
2.5000 mg | Freq: Once | INTRAMUSCULAR | Status: AC
  Administered 2015-12-06: 2.5 mg via INTRAVENOUS
  Filled 2015-12-06: qty 2

## 2015-12-06 MED ORDER — IBUPROFEN 600 MG PO TABS: 600.0000 mg | ORAL_TABLET | Freq: Four times a day (QID) | ORAL | 0 refills | Status: DC | PRN

## 2015-12-06 MED ORDER — ONDANSETRON HCL 4 MG/2ML IJ SOLN
4.0000 mg | Freq: Once | INTRAMUSCULAR | Status: AC
  Administered 2015-12-06: 4 mg via INTRAVENOUS
  Filled 2015-12-06: qty 2

## 2015-12-06 MED ORDER — MORPHINE SULFATE (PF) 4 MG/ML IV SOLN
4.0000 mg | Freq: Once | INTRAVENOUS | Status: AC
  Administered 2015-12-06: 4 mg via INTRAVENOUS
  Filled 2015-12-06: qty 1

## 2015-12-06 MED ORDER — MORPHINE SULFATE (PF) 4 MG/ML IV SOLN
INTRAVENOUS | Status: AC
  Administered 2015-12-06: 4 mg
  Filled 2015-12-06: qty 1

## 2015-12-06 MED ORDER — METHOCARBAMOL 500 MG PO TABS: 500.0000 mg | ORAL_TABLET | Freq: Two times a day (BID) | ORAL | 0 refills | Status: DC

## 2015-12-06 NOTE — ED Triage Notes (Signed)
Pt is here with after falling from chronic leg weakness and is complaining of lower back pain around sacral area.

## 2015-12-06 NOTE — ED Notes (Signed)
PA at bedside.

## 2015-12-06 NOTE — Discharge Instructions (Signed)
Use your knee brace and crutches you have at home to prevent further falls. Follow up with orthopedics when possible.

## 2015-12-06 NOTE — ED Provider Notes (Signed)
[facility] DEPT Provider Note   CSN: [id number] Arrival date & time: 12/06/15  1816     History   Chief Complaint Chief Complaint  Patient presents with  . Back Pain  . Fall   HPI   Carol Hughes is an 50 y.o. female with history of chronic right knee pain and weakness who presents to the ED for evaluation after a fall. She states she was walking when her right knee suddenly gave out and she fell straight down onto her bottom. She complains of severe tailbone pain that radiates to her lower back. She states she has fractured her tailbone before and this pain feels similar. She states her right knee has chronic weakness and "always gives out;" states she has frequent falls. She has been unable to follow up with orthopedics for this issue so far. Denies numbness. Denies fever or chills. Denies nausea or vomiting. She has not taken anything for the pain.   Past Medical History:  Diagnosis Date  . Addiction to drug ([place])   . Cancer (HCC) throat  . COPD (chronic obstructive pulmonary disease) ([place])   . Coronary artery disease   . Diabetes mellitus without complication ([place])   . Hypertension   . Migraine   . Obesity   . Schizophrenia ([place])   . Seizures ([place])   . Sleep apnea   . Throat cancer [facility])     Patient Active Problem List   Diagnosis Date Noted  . Mastitis in female 07/05/2012  . Dehydration 07/05/2012  . Seizure disorder ([place]) 07/05/2012  . Chest pain 05/04/2012  . COPD (chronic obstructive pulmonary disease) ([place]) 05/04/2012  . Diabetes mellitus, type 2 ([place]) 05/04/2012  . Bipolar disorder ([place]) 05/04/2012    Past Surgical History:  Procedure Laterality Date  . ABDOMINAL HYSTERECTOMY    . APPENDECTOMY    . CESAREAN SECTION    . CHOLECYSTECTOMY    . EYE SURGERY    . KNEE SURGERY      OB History    No data available       Home Medications    Prior to Admission medications   Medication Sig Start Date End Date Taking? Authorizing Provider    insulin glargine (LANTUS) 100 UNIT/ML injection Inject 20 Units into the skin 2 (two) times daily.   Yes Historical Provider, MD  albuterol (PROVENTIL HFA;VENTOLIN HFA) 108 (90 BASE) MCG/ACT inhaler Inhale 2 puffs into the lungs every 6 (six) hours as needed for shortness of breath. Reported on 03/06/2015 10/27/10   Historical Provider, MD  cephALEXin (KEFLEX) 500 MG capsule Take 1 capsule (500 mg total) by mouth 2 (two) times daily. Patient not taking: Reported on 12/06/2015 10/04/15   Frederica Kuster, PA-C  citalopram (CELEXA) 40 MG tablet Take 40 mg by mouth daily.    Historical Provider, MD  clindamycin (CLEOCIN) 150 MG capsule Take 3 capsules (450 mg total) by mouth 3 (three) times daily. Patient not taking: Reported on 12/06/2015 01/21/15   Voncille Lo, MD  clindamycin (CLEOCIN) 300 MG capsule Take 1 capsule (300 mg total) by mouth 3 (three) times daily. X 7 days Patient not taking: Reported on 12/06/2015 06/02/15   Charlesetta Shanks, MD  divalproex (DEPAKOTE ER) 500 MG 24 hr tablet Take 1 tablet (500 mg total) by mouth 2 (two) times daily. Patient not taking: Reported on 12/06/2015 04/01/15   Julianne Rice, MD  divalproex (DEPAKOTE ER) 500 MG 24 hr tablet Take 1 tablet (500 mg total) by mouth 2 (two) times  daily. Patient not taking: Reported on 12/06/2015 10/05/15   Helane Gunther Joy, PA-C  erythromycin ophthalmic ointment Place a 1/2 inch ribbon of ointment into the lower eyelid. Patient not taking: Reported on 12/06/2015 01/21/15   Voncille Lo, MD  insulin aspart protamine- aspart (NOVOLOG MIX 70/30) (70-30) 100 UNIT/ML injection Inject 0.1 mLs (10 Units total) into the skin 2 (two) times daily with a meal. Reported on 04/01/2015 Patient not taking: Reported on 12/06/2015 06/17/15   Domenic Moras, PA-C  lisinopril-hydrochlorothiazide (PRINZIDE,ZESTORETIC) 20-25 MG tablet Take 1 tablet by mouth daily.    Historical Provider, MD  metFORMIN (GLUCOPHAGE) 500 MG tablet Take 1 tablet (500 mg total) by mouth 2 (two)  times daily with a meal. Patient not taking: Reported on 12/06/2015 06/17/15   Domenic Moras, PA-C  nitroGLYCERIN (NITROSTAT) 0.4 MG SL tablet Place 0.4 mg under the tongue every 5 (five) minutes as needed for chest pain.    Historical Provider, MD  nystatin cream (MYCOSTATIN) Apply to affected area 2 times daily until resolution of symptoms Patient not taking: Reported on 12/06/2015 10/04/15   Bea Graff Law, PA-C  predniSONE (DELTASONE) 10 MG tablet Take 2 tablets (20 mg total) by mouth daily. Patient not taking: Reported on 12/06/2015 12/09/14   Dalia Heading, PA-C    Family History Family History  Problem Relation Age of Onset  . Cancer Father     Social History Social History  Substance Use Topics  . Smoking status: Current Every Day Smoker    Packs/day: 0.50    Years: 30.00    Types: Cigarettes  . Smokeless tobacco: Never Used  . Alcohol use No     Allergies   Aspirin; Bee venom; Other; Naproxen; Toradol [ketorolac tromethamine]; Penicillins; Tramadol; and Vancomycin   Review of Systems Review of Systems 10 Systems reviewed and are negative for acute change except as noted in the HPI.  Physical Exam Updated Vital Signs BP 141/77 (BP Location: Left Arm)   Pulse 86   Temp 98.7 F (37.1 C) (Oral)   Resp 24   SpO2 97%   Physical Exam  Constitutional: She is oriented to person, place, and time.  Tearful, moaning in bed  HENT:  Head: Atraumatic.  Right Ear: External ear normal.  Left Ear: External ear normal.  Nose: Nose normal.  Eyes: Conjunctivae are normal. No scleral icterus.  Cardiovascular: Normal rate.   Pulmonary/Chest: Effort normal. No respiratory distress.  Abdominal: She exhibits no distension.  Musculoskeletal:  +lumbar spine and sacral tenderness  Will not cooperate with ROM exercises  Neurological: She is alert and oriented to person, place, and time.  Skin: Skin is warm and dry.  Psychiatric: She has a normal mood and affect. Her behavior is  normal.  Nursing note and vitals reviewed.    ED Treatments / Results  Labs (all labs ordered are listed, but only abnormal results are displayed) Labs Reviewed  CBG MONITORING, ED - Abnormal; Notable for the following:       Result Value   Glucose-Capillary 207 (*)    All other components within normal limits    EKG  EKG Interpretation None       Radiology Dg Lumbar Spine Complete  Result Date: 12/06/2015 CLINICAL DATA:  Status post fall, with lower back pain. Initial encounter. EXAM: LUMBAR SPINE - COMPLETE 4+ VIEW COMPARISON:  Lumbar spine radiographs performed 01/15/2014 FINDINGS: There is no evidence of fracture or subluxation. Vertebral bodies demonstrate normal height and alignment. Intervertebral disc spaces are preserved.  The visualized neural foramina are grossly unremarkable in appearance. The visualized bowel gas pattern is unremarkable in appearance; air and stool are noted within the colon. The sacroiliac joints are within normal limits. Clips are noted within the right upper quadrant, reflecting prior cholecystectomy. IMPRESSION: No evidence of fracture or subluxation along the lumbar spine. Electronically Signed   By: Garald Balding M.D.   On: 12/06/2015 22:00    Procedures Procedures (including critical care time)  Medications Ordered in ED Medications  morphine 4 MG/ML injection 4 mg (4 mg Intravenous Given 12/06/15 1953)  ondansetron (ZOFRAN) injection 4 mg (4 mg Intravenous Given 12/06/15 1952)  diazepam (VALIUM) injection 2.5 mg (2.5 mg Intravenous Given 12/06/15 1952)  morphine 4 MG/ML injection (4 mg  Given 12/06/15 2001)  morphine 2 MG/ML injection 2 mg (2 mg Intravenous Given 12/06/15 2210)     Initial Impression / Assessment and Plan / ED Course  I have reviewed the triage vital signs and the nursing notes.  Pertinent labs & imaging results that were available during my care of the patient were reviewed by me and considered in my medical decision  making (see chart for details).  Clinical Course    X-rays negative for acute findings. Pain improved in the ED. On repeat exam pt neurologically intact with no weakness and good movement of all extremities. She has crutches and a knee brace at home which I encouraged her to use given repeated knee buckling. Will give rx for pain meds. Encouraged f/u with ortho when financially feasible. ER return precautions given.  Final Clinical Impressions(s) / ED Diagnoses   Final diagnoses:  Lumbosacral pain  Fall, initial encounter    New Prescriptions Discharge Medication List as of 12/06/2015 10:42 PM    START taking these medications   Details  HYDROcodone-acetaminophen (NORCO/VICODIN) 5-325 MG tablet Take 1 tablet by mouth every 4 (four) hours as needed for severe pain., Starting Fri 12/06/2015, Print    ibuprofen (ADVIL,MOTRIN) 600 MG tablet Take 1 tablet (600 mg total) by mouth every 6 (six) hours as needed., Starting Fri 12/06/2015, Print    methocarbamol (ROBAXIN) 500 MG tablet Take 1 tablet (500 mg total) by mouth 2 (two) times daily., Starting Fri 12/06/2015, Print         Anne Ng, PA-C 12/06/15 2328    Charlesetta Shanks, MD 12/07/15 [phone]

## 2016-01-06 ENCOUNTER — Emergency Department (HOSPITAL_COMMUNITY): Payer: Medicaid Other

## 2016-01-06 ENCOUNTER — Emergency Department (HOSPITAL_COMMUNITY)
Admission: EM | Admit: 2016-01-06 | Discharge: 2016-01-06 | Disposition: A | Discharge status: Home / Self Care | Payer: Medicaid Other | Attending: Emergency Medicine | Admitting: Emergency Medicine

## 2016-01-06 ENCOUNTER — Encounter (HOSPITAL_COMMUNITY): Payer: Self-pay | Admitting: Emergency Medicine

## 2016-01-06 DIAGNOSIS — F17210 Nicotine dependence, cigarettes, uncomplicated: Secondary | ICD-10-CM | POA: Insufficient documentation

## 2016-01-06 DIAGNOSIS — I10 Essential (primary) hypertension: Secondary | ICD-10-CM | POA: Insufficient documentation

## 2016-01-06 DIAGNOSIS — Z794 Long term (current) use of insulin: Secondary | ICD-10-CM | POA: Diagnosis not present

## 2016-01-06 DIAGNOSIS — E119 Type 2 diabetes mellitus without complications: Secondary | ICD-10-CM | POA: Diagnosis not present

## 2016-01-06 DIAGNOSIS — Z7984 Long term (current) use of oral hypoglycemic drugs: Secondary | ICD-10-CM | POA: Diagnosis not present

## 2016-01-06 DIAGNOSIS — R071 Chest pain on breathing: Secondary | ICD-10-CM | POA: Diagnosis present

## 2016-01-06 DIAGNOSIS — I2510 Atherosclerotic heart disease of native coronary artery without angina pectoris: Secondary | ICD-10-CM | POA: Diagnosis not present

## 2016-01-06 DIAGNOSIS — R0789 Other chest pain: Secondary | ICD-10-CM | POA: Diagnosis not present

## 2016-01-06 DIAGNOSIS — J449 Chronic obstructive pulmonary disease, unspecified: Secondary | ICD-10-CM | POA: Insufficient documentation

## 2016-01-06 DIAGNOSIS — Z79899 Other long term (current) drug therapy: Secondary | ICD-10-CM | POA: Insufficient documentation

## 2016-01-06 DIAGNOSIS — Z8589 Personal history of malignant neoplasm of other organs and systems: Secondary | ICD-10-CM | POA: Insufficient documentation

## 2016-01-06 LAB — BASIC METABOLIC PANEL
ANION GAP: 8 (ref 5–15)
BUN: 11 mg/dL (ref 6–20)
CO2: 26 mmol/L (ref 22–32)
Calcium: 9.1 mg/dL (ref 8.9–10.3)
Chloride: 105 mmol/L (ref 101–111)
Creatinine, Ser: 0.68 mg/dL (ref 0.44–1.00)
GLUCOSE: 173 mg/dL — AB (ref 65–99)
POTASSIUM: 4 mmol/L (ref 3.5–5.1)
Sodium: 139 mmol/L (ref 135–145)

## 2016-01-06 LAB — I-STAT TROPONIN, ED
TROPONIN I, POC: 0.01 ng/mL (ref 0.00–0.08)
Troponin i, poc: 0.01 ng/mL (ref 0.00–0.08)

## 2016-01-06 LAB — CBC
HEMATOCRIT: 39.7 % (ref 36.0–46.0)
HEMOGLOBIN: 13.2 g/dL (ref 12.0–15.0)
MCH: 30.3 pg (ref 26.0–34.0)
MCHC: 33.2 g/dL (ref 30.0–36.0)
MCV: 91.1 fL (ref 78.0–100.0)
Platelets: 169 10*3/uL (ref 150–400)
RBC: 4.36 MIL/uL (ref 3.87–5.11)
RDW: 12.6 % (ref 11.5–15.5)
WBC: 6.5 10*3/uL (ref 4.0–10.5)

## 2016-01-06 MED ORDER — ASPIRIN 81 MG PO CHEW: 81.0000 mg | CHEWABLE_TABLET | Freq: Once | ORAL | Status: DC

## 2016-01-06 MED ORDER — LORAZEPAM 2 MG/ML IJ SOLN
1.0000 mg | Freq: Once | INTRAMUSCULAR | Status: AC
  Administered 2016-01-06: 1 mg via INTRAVENOUS
  Filled 2016-01-06: qty 1

## 2016-01-06 MED ORDER — MORPHINE SULFATE (PF) 2 MG/ML IV SOLN
2.0000 mg | Freq: Once | INTRAVENOUS | Status: AC
  Administered 2016-01-06: 2 mg via INTRAVENOUS
  Filled 2016-01-06: qty 1

## 2016-01-06 NOTE — ED Provider Notes (Signed)
[facility] DEPT Provider Note   CSN: [id number] Arrival date & time: 01/06/16  1132     History   Chief Complaint Chief Complaint  Patient presents with  . Chest Pain    HPI Carol Hughes is a 50 y.o. female.  The history is provided by the patient (joined in ED by daughter).  Chest Pain   This is a new problem. The current episode started 6 to 12 hours ago. The problem occurs constantly. The problem has not changed since onset.The pain is present in the lateral region (left). The pain is moderate. The quality of the pain is described as pressure-like (aching). The pain radiates to the right shoulder and right arm. Duration of episode(s) is 1 day. The symptoms are aggravated by deep breathing and certain positions. Pertinent negatives include no abdominal pain, no back pain, no cough, no diaphoresis, no fever, no headaches, no leg pain, no lower extremity edema, no malaise/fatigue, no nausea, no shortness of breath and no vomiting. She has tried rest for the symptoms. The treatment provided no relief.    Past Medical History:  Diagnosis Date  . Addiction to drug ([place])   . Cancer (HCC) throat  . COPD (chronic obstructive pulmonary disease) ([place])   . Coronary artery disease   . Diabetes mellitus without complication ([place])   . Hypertension   . Migraine   . Obesity   . Schizophrenia ([place])   . Seizures ([place])   . Sleep apnea   . Throat cancer [facility])     Patient Active Problem List   Diagnosis Date Noted  . Mastitis in female 07/05/2012  . Dehydration 07/05/2012  . Seizure disorder ([place]) 07/05/2012  . Chest pain 05/04/2012  . COPD (chronic obstructive pulmonary disease) ([place]) 05/04/2012  . Diabetes mellitus, type 2 ([place]) 05/04/2012  . Bipolar disorder ([place]) 05/04/2012    Past Surgical History:  Procedure Laterality Date  . ABDOMINAL HYSTERECTOMY    . APPENDECTOMY    . CESAREAN SECTION    . CHOLECYSTECTOMY    . EYE SURGERY    . KNEE SURGERY      OB History      No data available       Home Medications    Prior to Admission medications   Medication Sig Start Date End Date Taking? Authorizing Provider  insulin glargine (LANTUS) 100 UNIT/ML injection Inject 20 Units into the skin 2 (two) times daily.   Yes Historical Provider, MD  divalproex (DEPAKOTE ER) 500 MG 24 hr tablet Take 1 tablet (500 mg total) by mouth 2 (two) times daily. Patient not taking: Reported on 12/06/2015 04/01/15   Julianne Rice, MD  divalproex (DEPAKOTE ER) 500 MG 24 hr tablet Take 1 tablet (500 mg total) by mouth 2 (two) times daily. Patient not taking: Reported on 12/06/2015 10/05/15   Lorayne Bender, PA-C  HYDROcodone-acetaminophen (NORCO/VICODIN) 5-325 MG tablet Take 1 tablet by mouth every 4 (four) hours as needed for severe pain. Patient not taking: Reported on 01/06/2016 12/06/15   Olivia Canter Sam, PA-C  ibuprofen (ADVIL,MOTRIN) 600 MG tablet Take 1 tablet (600 mg total) by mouth every 6 (six) hours as needed. Patient not taking: Reported on 01/06/2016 12/06/15   Olivia Canter Sam, PA-C  insulin aspart protamine- aspart (NOVOLOG MIX 70/30) (70-30) 100 UNIT/ML injection Inject 0.1 mLs (10 Units total) into the skin 2 (two) times daily with a meal. Reported on 04/01/2015 Patient not taking: Reported on 12/06/2015 06/17/15   Domenic Moras, PA-C  metFORMIN (  GLUCOPHAGE) 500 MG tablet Take 1 tablet (500 mg total) by mouth 2 (two) times daily with a meal. Patient not taking: Reported on 12/06/2015 06/17/15   Domenic Moras, PA-C  methocarbamol (ROBAXIN) 500 MG tablet Take 1 tablet (500 mg total) by mouth 2 (two) times daily. Patient not taking: Reported on 01/06/2016 12/06/15   Anne Ng, PA-C    Family History Family History  Problem Relation Age of Onset  . Cancer Father     Social History Social History  Substance Use Topics  . Smoking status: Current Every Day Smoker    Packs/day: 0.50    Years: 30.00    Types: Cigarettes  . Smokeless tobacco: Never Used  . Alcohol use No      Allergies   Aspirin; Bee venom; Other; Naproxen; Toradol [ketorolac tromethamine]; Penicillins; Tramadol; and Vancomycin   Review of Systems Review of Systems  Constitutional: Negative for diaphoresis, fever and malaise/fatigue.  HENT: Negative for congestion.   Respiratory: Negative for cough and shortness of breath.   Cardiovascular: Positive for chest pain. Negative for leg swelling.  Gastrointestinal: Negative for abdominal pain, nausea and vomiting.  Genitourinary: Negative for flank pain.  Musculoskeletal: Negative for back pain.  Skin: Negative for rash.  Neurological: Negative for headaches.  Psychiatric/Behavioral: Negative for confusion.     Physical Exam Updated Vital Signs BP 106/73 (BP Location: Right Arm)   Pulse 78   Temp 98.7 F (37.1 C) (Oral)   Resp 20   Ht 5\' 5"  (1.651 m)   Wt 90.7 kg   SpO2 99%   BMI 33.28 kg/m   Physical Exam  Constitutional: She is oriented to person, place, and time. She appears well-developed and well-nourished. No distress.  Cooperative, overweight otherwise well-appearing  HENT:  Head: Normocephalic and atraumatic.  Eyes: Conjunctivae are normal. No scleral icterus.  Neck: Normal range of motion. Neck supple. No JVD present.  Cardiovascular: Normal rate, regular rhythm and intact distal pulses.  Exam reveals no gallop and no friction rub.   Pulmonary/Chest: Effort normal and breath sounds normal. No respiratory distress. She exhibits tenderness.  Left lateral chest wall TTP  Abdominal: Soft. She exhibits no distension. There is no tenderness.  Musculoskeletal: She exhibits no edema or tenderness.  Symmetric size and appearance of b/l Le's, no calf swelling or tenderness  Neurological: She is alert and oriented to person, place, and time. She exhibits normal muscle tone. Coordination normal.  Skin: Skin is warm and dry. Capillary refill takes less than 2 seconds. No rash noted. She is not diaphoretic.  Psychiatric:   Mildly anxious-appearing, intermittently tearful 2/2 worry  Nursing note and vitals reviewed.    ED Treatments / Results  Labs (all labs ordered are listed, but only abnormal results are displayed) Labs Reviewed  BASIC METABOLIC PANEL - Abnormal; Notable for the following:       Result Value   Glucose, Bld 173 (*)    All other components within normal limits  CBC  RAPID URINE DRUG SCREEN, HOSP PERFORMED  I-STAT TROPOININ, ED  I-STAT TROPOININ, ED    EKG  EKG Interpretation  Date/Time:  Monday January 06 2016 11:34:17 EDT Ventricular Rate:  86 PR Interval:    QRS Duration: 93 QT Interval:  413 QTC Calculation: 420 R Axis:   80 Text Interpretation:  Sinus rhythm with frequent Premature ventricular complexes Confirmed by Ashok Cordia  MD, Lennette Bihari ([id number]) on 01/06/2016 1:21:55 PM       Radiology Dg Chest 2 View  Result Date: 01/06/2016 CLINICAL DATA:  Sharp chest pain radiating from right arm EXAM: CHEST  2 VIEW COMPARISON:  10/05/2015 FINDINGS: The heart size and mediastinal contours are within normal limits. Both lungs are clear. Mild spondylosis identified within the thoracic spine. IMPRESSION: No active cardiopulmonary disease. Electronically Signed   By: Kerby Moors M.D.   On: 01/06/2016 12:53    Procedures Procedures (including critical care time)  Medications Ordered in ED Medications  LORazepam (ATIVAN) injection 1 mg (not administered)  morphine 2 MG/ML injection 2 mg (2 mg Intravenous Given 01/06/16 1222)  morphine 2 MG/ML injection 2 mg (2 mg Intravenous Given 01/06/16 1620)     Initial Impression / Assessment and Plan / ED Course  I have reviewed the triage vital signs and the nursing notes.  Pertinent labs & imaging results that were available during my care of the patient were reviewed by me and considered in my medical decision making (see chart for details).  Clinical Course   Carol Hughes is a 50 y.o. female with h/o NIDDM, schizophrenia,  homelessness, follows at the [facility] designated by [employer] per Case Physiological scientist) who presents to ED for evaluation of left sided chest pain radiating down RUE x 1 day, worse with twisting motions side-to-side, worse with deep breaths. Denies shortness of breath, nausea/vomiting, diaphoresis, or other symptoms concerning for ACS or Pe. Doubt ACS, Pe, or dissection. Based on H&P, likely MSK or neuropathic pain. Denies injuries. CXR unremarkable for acute abnormality. BMP and CBC nml. Delta troponins negative. EKG with frequent PVC's, no acute ST/T wave changes concerning for acute ischemia.  Decreased PVC's on monitor while in Ed. Likely related to anxiety, which may also be contributing to the chest pain.  Advised to f/u at [facility] this week for referral for stress test and further assessment. Advised to return to ER for any new, worse, or concerning symptoms. She demonstrates understanding of this and comfort with discharge. Pt states she prefers for discharge because she would like to take care of her dog tonight.  Pt condition, course, and discharge were discussed with attending physician.   Final Clinical Impressions(s) / ED Diagnoses   Final diagnoses:  Chest wall pain    New Prescriptions New Prescriptions   No medications on file     Paralee Cancel, MD 01/06/16 [place], MD 01/07/16 [phone]
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
## 2016-01-06 NOTE — ED Notes (Signed)
EDP followed up with pt about social work consult. Pt refused and states she no longer wants a consult.

## 2016-01-06 NOTE — ED Notes (Signed)
Donita Brooks (pt's daughter) - [phone]

## 2016-01-10 ENCOUNTER — Emergency Department (HOSPITAL_COMMUNITY)
Admission: EM | Admit: 2016-01-10 | Discharge: 2016-01-10 | Disposition: A | Discharge status: Home / Self Care | Payer: Medicaid Other | Attending: Emergency Medicine | Admitting: Emergency Medicine

## 2016-01-10 ENCOUNTER — Encounter (HOSPITAL_COMMUNITY): Payer: Self-pay | Admitting: *Deleted

## 2016-01-10 DIAGNOSIS — Z85818 Personal history of malignant neoplasm of other sites of lip, oral cavity, and pharynx: Secondary | ICD-10-CM | POA: Insufficient documentation

## 2016-01-10 DIAGNOSIS — F17210 Nicotine dependence, cigarettes, uncomplicated: Secondary | ICD-10-CM | POA: Diagnosis not present

## 2016-01-10 DIAGNOSIS — E119 Type 2 diabetes mellitus without complications: Secondary | ICD-10-CM | POA: Diagnosis not present

## 2016-01-10 DIAGNOSIS — Z79899 Other long term (current) drug therapy: Secondary | ICD-10-CM | POA: Insufficient documentation

## 2016-01-10 DIAGNOSIS — N898 Other specified noninflammatory disorders of vagina: Secondary | ICD-10-CM | POA: Diagnosis present

## 2016-01-10 DIAGNOSIS — Z794 Long term (current) use of insulin: Secondary | ICD-10-CM | POA: Insufficient documentation

## 2016-01-10 DIAGNOSIS — I2510 Atherosclerotic heart disease of native coronary artery without angina pectoris: Secondary | ICD-10-CM | POA: Insufficient documentation

## 2016-01-10 DIAGNOSIS — Z7984 Long term (current) use of oral hypoglycemic drugs: Secondary | ICD-10-CM | POA: Insufficient documentation

## 2016-01-10 DIAGNOSIS — J449 Chronic obstructive pulmonary disease, unspecified: Secondary | ICD-10-CM | POA: Diagnosis not present

## 2016-01-10 DIAGNOSIS — A599 Trichomoniasis, unspecified: Secondary | ICD-10-CM | POA: Diagnosis not present

## 2016-01-10 DIAGNOSIS — I10 Essential (primary) hypertension: Secondary | ICD-10-CM | POA: Insufficient documentation

## 2016-01-10 LAB — URINALYSIS, ROUTINE W REFLEX MICROSCOPIC
BILIRUBIN URINE: NEGATIVE
GLUCOSE, UA: NEGATIVE mg/dL
Ketones, ur: NEGATIVE mg/dL
Nitrite: NEGATIVE
PROTEIN: NEGATIVE mg/dL
SPECIFIC GRAVITY, URINE: 1.024 (ref 1.005–1.030)
pH: 5.5 (ref 5.0–8.0)

## 2016-01-10 LAB — WET PREP, GENITAL
SPERM: NONE SEEN
Yeast Wet Prep HPF POC: NONE SEEN

## 2016-01-10 LAB — GC/CHLAMYDIA PROBE AMP (~~LOC~~) NOT AT ARMC
Chlamydia: NEGATIVE
NEISSERIA GONORRHEA: NEGATIVE

## 2016-01-10 LAB — URINE MICROSCOPIC-ADD ON

## 2016-01-10 MED ORDER — METRONIDAZOLE 500 MG PO TABS
2000.0000 mg | ORAL_TABLET | Freq: Once | ORAL | Status: AC
  Administered 2016-01-10: 2000 mg via ORAL
  Filled 2016-01-10: qty 4

## 2016-01-10 NOTE — ED Triage Notes (Signed)
Pt c/o vaginal itching and brown discharge. Hx of yeast infections. Denies abdominal pain

## 2016-01-10 NOTE — ED Notes (Signed)
Pt standing at door with clothes on and requesting bus pass with discharge papers.

## 2016-01-10 NOTE — ED Notes (Signed)
Food and drink given as pt states she is hungry.

## 2016-01-10 NOTE — ED Provider Notes (Signed)
[facility] DEPT Provider Note   CSN: [id number] Arrival date & time: 01/10/16  [id number]     History   Chief Complaint Chief Complaint  Patient presents with  . Vaginitis    HPI Carol Hughes is a 50 y.o. female.  The history is provided by the patient and medical records. No language interpreter was used.   Carol Hughes is a 51 y.o. female  with a PMH of COPD, DM, HTN, CAD, schizophrenia who presents to the Emergency Department complaining of persistent vaginal itching and white discharge that began yesterday. No medications taken prior to arrival for symptoms. Patient states that she just broke up with a significant other and would like to be "checked out".  Denies abdominal pain, back pain, chest pain, dysuria, urinary urgency/frequency, fevers or any additional complaints.   Past Medical History:  Diagnosis Date  . Addiction to drug ([place])   . Cancer (HCC) throat  . COPD (chronic obstructive pulmonary disease) ([place])   . Coronary artery disease   . Diabetes mellitus without complication ([place])   . Hypertension   . Migraine   . Obesity   . Schizophrenia ([place])   . Seizures ([place])   . Sleep apnea   . Throat cancer [facility]     Patient Active Problem List   Diagnosis Date Noted  . Mastitis in female 07/05/2012  . Dehydration 07/05/2012  . Seizure disorder ([place]) 07/05/2012  . Chest pain 05/04/2012  . COPD (chronic obstructive pulmonary disease) ([place]) 05/04/2012  . Diabetes mellitus, type 2 ([place]) 05/04/2012  . Bipolar disorder ([place]) 05/04/2012    Past Surgical History:  Procedure Laterality Date  . ABDOMINAL HYSTERECTOMY    . APPENDECTOMY    . CESAREAN SECTION    . CHOLECYSTECTOMY    . EYE SURGERY    . KNEE SURGERY      OB History    No data available       Home Medications    Prior to Admission medications   Medication Sig Start Date End Date Taking? Authorizing Provider  insulin glargine (LANTUS) 100 UNIT/ML injection Inject 20 Units into the skin  2 (two) times daily.   Yes Historical Provider, MD  divalproex (DEPAKOTE ER) 500 MG 24 hr tablet Take 1 tablet (500 mg total) by mouth 2 (two) times daily. Patient not taking: Reported on 01/10/2016 04/01/15   Julianne Rice, MD  divalproex (DEPAKOTE ER) 500 MG 24 hr tablet Take 1 tablet (500 mg total) by mouth 2 (two) times daily. Patient not taking: Reported on 01/10/2016 10/05/15   Lorayne Bender, PA-C  HYDROcodone-acetaminophen (NORCO/VICODIN) 5-325 MG tablet Take 1 tablet by mouth every 4 (four) hours as needed for severe pain. Patient not taking: Reported on 01/10/2016 12/06/15   Olivia Canter Sam, PA-C  ibuprofen (ADVIL,MOTRIN) 600 MG tablet Take 1 tablet (600 mg total) by mouth every 6 (six) hours as needed. Patient not taking: Reported on 01/10/2016 12/06/15   Olivia Canter Sam, PA-C  insulin aspart protamine- aspart (NOVOLOG MIX 70/30) (70-30) 100 UNIT/ML injection Inject 0.1 mLs (10 Units total) into the skin 2 (two) times daily with a meal. Reported on 04/01/2015 Patient not taking: Reported on 01/10/2016 06/17/15   Domenic Moras, PA-C  metFORMIN (GLUCOPHAGE) 500 MG tablet Take 1 tablet (500 mg total) by mouth 2 (two) times daily with a meal. Patient not taking: Reported on 01/10/2016 06/17/15   Domenic Moras, PA-C  methocarbamol (ROBAXIN) 500 MG tablet Take 1 tablet (500 mg total) by mouth 2 (two)  times daily. Patient not taking: Reported on 01/10/2016 12/06/15   Anne Ng, PA-C    Family History Family History  Problem Relation Age of Onset  . Cancer Father     Social History Social History  Substance Use Topics  . Smoking status: Current Every Day Smoker    Packs/day: 0.50    Years: 30.00    Types: Cigarettes  . Smokeless tobacco: Never Used  . Alcohol use No     Allergies   Aspirin; Bee venom; Other; Naproxen; Toradol [ketorolac tromethamine]; Penicillins; Tramadol; and Vancomycin   Review of Systems Review of Systems  Constitutional: Negative for fever.  HENT: Negative for  congestion.   Eyes: Negative for visual disturbance.  Respiratory: Negative for shortness of breath.   Cardiovascular: Negative for chest pain.  Gastrointestinal: Negative for abdominal pain, nausea and vomiting.  Genitourinary: Positive for vaginal discharge. Negative for dysuria, frequency, urgency and vaginal bleeding.  Musculoskeletal: Negative for back pain.  Skin: Negative for rash.  Neurological: Negative for headaches.     Physical Exam Updated Vital Signs BP 121/69   Pulse 70   Temp 97.9 F (36.6 C) (Oral)   Resp 16   SpO2 95%   Physical Exam  Constitutional: She is oriented to person, place, and time. She appears well-developed and well-nourished. No distress.  HENT:  Head: Normocephalic and atraumatic.  Cardiovascular: Normal rate, regular rhythm and normal heart sounds.   No murmur heard. Pulmonary/Chest: Effort normal and breath sounds normal. No respiratory distress.  Abdominal:  Soft, nontender, nondistended. No CVA tenderness.  Genitourinary:  Genitourinary Comments: Chaperone present for exam. + white discharge. No CMT. No rashes, lesions, or tenderness to external genitalia. No erythema, injury, or tenderness to vaginal mucosa. No adnexal masses, tenderness, or fullness.  Musculoskeletal: She exhibits no edema.  Neurological: She is alert and oriented to person, place, and time.  Skin: Skin is warm and dry.  Nursing note and vitals reviewed.    ED Treatments / Results  Labs (all labs ordered are listed, but only abnormal results are displayed) Labs Reviewed  WET PREP, GENITAL - Abnormal; Notable for the following:       Result Value   Trich, Wet Prep PRESENT (*)    Clue Cells Wet Prep HPF POC PRESENT (*)    WBC, Wet Prep HPF POC MANY (*)    All other components within normal limits  URINALYSIS, ROUTINE W REFLEX MICROSCOPIC (NOT AT [facility]) - Abnormal; Notable for the following:    APPearance CLOUDY (*)    Hgb urine dipstick MODERATE (*)     Leukocytes, UA LARGE (*)    All other components within normal limits  URINE MICROSCOPIC-ADD ON - Abnormal; Notable for the following:    Squamous Epithelial / LPF 0-5 (*)    Bacteria, UA FEW (*)    All other components within normal limits  URINE CULTURE  GC/CHLAMYDIA PROBE AMP ([place]) NOT AT [facility]    EKG  EKG Interpretation None       Radiology No results found.  Procedures Procedures (including critical care time)  Medications Ordered in ED Medications  metroNIDAZOLE (FLAGYL) tablet 2,000 mg (2,000 mg Oral Given 01/10/16 0843)     Initial Impression / Assessment and Plan / ED Course  I have reviewed the triage vital signs and the nursing notes.  Pertinent labs & imaging results that were available during my care of the patient were reviewed by me and considered in my medical decision making (  see chart for details).  Clinical Course   Carol Hughes is a 50 y.o. female who presents to ED for vaginal itching that began yesterday. On exam, patient with no abdominal or CVA tenderness. Afebrile and hemodynamically stable. GU exam with white discharge but no adnexal or cervical motion tenderness. Wet prep with trichomoniasis and clue cells. Plan to treat with Rx for Flagyl 7 days, however patient states that she only has $3 and would not be able to afford this prescription. Therefore will give 2 g Flagyl in ED today for treatment. Patient aware that she was tested for gonorrhea and chlamydia and she will be called if results are positive. Patient aware that she needs to inform all sexual contacts about + Trichomonas so that they can be treated. Follow-up care and return precautions were discussed. Patient verbalizes and agrees with plan as dictated above. All questions answered.    Final Clinical Impressions(s) / ED Diagnoses   Final diagnoses:  Trichimoniasis    New Prescriptions Discharge Medication List as of 01/10/2016  8:58 AM       Ozella Almond Lynne Takemoto,  PA-C 01/10/16 [id number]    Lacretia Leigh, MD 01/11/16 1712

## 2016-01-10 NOTE — Discharge Instructions (Signed)
You have been treated for trichomonas. You need to inform any and all sexual contacts so that they can be treated as well. Use a condom with every sexual encounter. Follow up with your doctor, or OBGYN in regards to today's visit.   Please return to the ER for worsening symptoms, high fevers or persistent vomiting. You have been tested for chlamydia and gonorrhea. These results will be available in approximately 3 days. You will be notified if they are positive.   SEEK IMMEDIATE MEDICAL CARE IF:  You develop an oral temperature above 102 F (38.9 C), not controlled by medications or lasting more than 2 days.  You develop an increase in pain.  You develop any type of abnormal discharge.  You develop vaginal bleeding and it is not time for your period.  You develop painful intercourse.

## 2016-01-10 NOTE — ED Notes (Signed)
EDP at bedside  

## 2016-01-10 NOTE — ED Notes (Signed)
Pt no longer in room. Left without discharge instructions.

## 2016-01-11 LAB — URINE CULTURE

## 2016-02-18 ENCOUNTER — Emergency Department (HOSPITAL_COMMUNITY): Payer: Medicaid Other

## 2016-02-18 ENCOUNTER — Emergency Department (HOSPITAL_COMMUNITY)
Admission: EM | Admit: 2016-02-18 | Discharge: 2016-02-18 | Discharge status: Against Medical Advice | Payer: Medicaid Other | Attending: Emergency Medicine | Admitting: Emergency Medicine

## 2016-02-18 ENCOUNTER — Encounter (HOSPITAL_COMMUNITY): Payer: Self-pay | Admitting: Family Medicine

## 2016-02-18 DIAGNOSIS — I10 Essential (primary) hypertension: Secondary | ICD-10-CM | POA: Insufficient documentation

## 2016-02-18 DIAGNOSIS — G40909 Epilepsy, unspecified, not intractable, without status epilepticus: Secondary | ICD-10-CM | POA: Insufficient documentation

## 2016-02-18 DIAGNOSIS — Z79899 Other long term (current) drug therapy: Secondary | ICD-10-CM | POA: Diagnosis not present

## 2016-02-18 DIAGNOSIS — F17210 Nicotine dependence, cigarettes, uncomplicated: Secondary | ICD-10-CM | POA: Diagnosis not present

## 2016-02-18 DIAGNOSIS — Z794 Long term (current) use of insulin: Secondary | ICD-10-CM | POA: Diagnosis not present

## 2016-02-18 DIAGNOSIS — I2510 Atherosclerotic heart disease of native coronary artery without angina pectoris: Secondary | ICD-10-CM | POA: Diagnosis not present

## 2016-02-18 DIAGNOSIS — J449 Chronic obstructive pulmonary disease, unspecified: Secondary | ICD-10-CM | POA: Insufficient documentation

## 2016-02-18 DIAGNOSIS — Z85818 Personal history of malignant neoplasm of other sites of lip, oral cavity, and pharynx: Secondary | ICD-10-CM | POA: Diagnosis not present

## 2016-02-18 DIAGNOSIS — R569 Unspecified convulsions: Secondary | ICD-10-CM

## 2016-02-18 DIAGNOSIS — E119 Type 2 diabetes mellitus without complications: Secondary | ICD-10-CM | POA: Insufficient documentation

## 2016-02-18 LAB — RAPID URINE DRUG SCREEN, HOSP PERFORMED
AMPHETAMINES: NOT DETECTED
BENZODIAZEPINES: NOT DETECTED
Barbiturates: NOT DETECTED
COCAINE: NOT DETECTED
OPIATES: NOT DETECTED
TETRAHYDROCANNABINOL: NOT DETECTED

## 2016-02-18 LAB — CBC WITH DIFFERENTIAL/PLATELET
Basophils Absolute: 0 10*3/uL (ref 0.0–0.1)
Basophils Relative: 0 %
EOS PCT: 1 %
Eosinophils Absolute: 0.1 10*3/uL (ref 0.0–0.7)
HCT: 40.8 % (ref 36.0–46.0)
Hemoglobin: 13.5 g/dL (ref 12.0–15.0)
LYMPHS ABS: 3.6 10*3/uL (ref 0.7–4.0)
LYMPHS PCT: 44 %
MCH: 30.6 pg (ref 26.0–34.0)
MCHC: 33.1 g/dL (ref 30.0–36.0)
MCV: 92.5 fL (ref 78.0–100.0)
Monocytes Absolute: 0.6 10*3/uL (ref 0.1–1.0)
Monocytes Relative: 7 %
Neutro Abs: 3.9 10*3/uL (ref 1.7–7.7)
Neutrophils Relative %: 48 %
PLATELETS: 231 10*3/uL (ref 150–400)
RBC: 4.41 MIL/uL (ref 3.87–5.11)
RDW: 13 % (ref 11.5–15.5)
WBC: 8.1 10*3/uL (ref 4.0–10.5)

## 2016-02-18 LAB — COMPREHENSIVE METABOLIC PANEL
ALT: 8 U/L — AB (ref 14–54)
ANION GAP: 7 (ref 5–15)
AST: 14 U/L — ABNORMAL LOW (ref 15–41)
Albumin: 4.1 g/dL (ref 3.5–5.0)
Alkaline Phosphatase: 81 U/L (ref 38–126)
BUN: 14 mg/dL (ref 6–20)
CHLORIDE: 103 mmol/L (ref 101–111)
CO2: 29 mmol/L (ref 22–32)
Calcium: 9.5 mg/dL (ref 8.9–10.3)
Creatinine, Ser: 0.72 mg/dL (ref 0.44–1.00)
GFR calc non Af Amer: >60 mL/min (ref >60)
Glucose, Bld: 116 mg/dL — ABNORMAL HIGH (ref 65–99)
POTASSIUM: 4.1 mmol/L (ref 3.5–5.1)
SODIUM: 139 mmol/L (ref 135–145)
Total Bilirubin: 0.7 mg/dL (ref 0.3–1.2)
Total Protein: 7.4 g/dL (ref 6.5–8.1)

## 2016-02-18 LAB — I-STAT CHEM 8, ED
BUN: 14 mg/dL (ref 6–20)
CALCIUM ION: 1.19 mmol/L (ref 1.15–1.40)
CHLORIDE: 101 mmol/L (ref 101–111)
Creatinine, Ser: 0.8 mg/dL (ref 0.44–1.00)
GLUCOSE: 111 mg/dL — AB (ref 65–99)
HCT: 41 % (ref 36.0–46.0)
HEMOGLOBIN: 13.9 g/dL (ref 12.0–15.0)
Potassium: 4.1 mmol/L (ref 3.5–5.1)
Sodium: 139 mmol/L (ref 135–145)
TCO2: 28 mmol/L (ref 0–100)

## 2016-02-18 LAB — MAGNESIUM: Magnesium: 1.9 mg/dL (ref 1.7–2.4)

## 2016-02-18 LAB — VALPROIC ACID LEVEL: Valproic Acid Lvl: <10 ug/mL — ABNORMAL LOW (ref 50.0–100.0)

## 2016-02-18 LAB — ETHANOL

## 2016-02-18 LAB — CBG MONITORING, ED: GLUCOSE-CAPILLARY: 98 mg/dL (ref 65–99)

## 2016-02-18 LAB — LIPASE, BLOOD: Lipase: 27 U/L (ref 11–51)

## 2016-02-18 MED ORDER — DIVALPROEX SODIUM ER 500 MG PO TB24
1000.0000 mg | ORAL_TABLET | Freq: Every day | ORAL | Status: DC
  Administered 2016-02-18: 1000 mg via ORAL
  Filled 2016-02-18: qty 2

## 2016-02-18 MED ORDER — SODIUM CHLORIDE 0.9 % IV SOLN
INTRAVENOUS | Status: DC
  Administered 2016-02-18: 17:00:00 via INTRAVENOUS

## 2016-02-18 MED ORDER — SODIUM CHLORIDE 0.9 % IV BOLUS (SEPSIS)
500.0000 mL | Freq: Once | INTRAVENOUS | Status: AC
  Administered 2016-02-18: 500 mL via INTRAVENOUS

## 2016-02-18 MED ORDER — DIVALPROEX SODIUM ER 500 MG PO TB24: 500.0000 mg | ORAL_TABLET | Freq: Two times a day (BID) | ORAL | 4 refills | Status: DC

## 2016-02-18 NOTE — ED Provider Notes (Signed)
[facility] DEPT Provider Note   CSN: [id number] Arrival date & time: 02/18/16  1607     History   Chief Complaint Chief Complaint  Patient presents with  . Seizures    HPI Carol Hughes is a 50 y.o. female.  The patient brought in by EMS from around [employer] area of called for a generalized seizure. Patient has a history of seizure disorder. Patient arrived here still little drowsy but answering questions. Patient also has a history of schizophrenia. Patient supposedly has a history of diabetes but she's been noncompliant with all her meds for some period of time. Patient states that she supposed to be on Depakote she has not been taking it. Patient's only complaint is pain behind the back of her head. No bleeding. No other complaints.      Past Medical History:  Diagnosis Date  . Addiction to drug ([place])   . Cancer (HCC) throat  . COPD (chronic obstructive pulmonary disease) ([place])   . Coronary artery disease   . Diabetes mellitus without complication ([place])   . Hypertension   . Migraine   . Obesity   . Schizophrenia ([place])   . Seizures ([place])   . Sleep apnea   . Throat cancer [facility])     Patient Active Problem List   Diagnosis Date Noted  . Mastitis in female 07/05/2012  . Dehydration 07/05/2012  . Seizure disorder ([place]) 07/05/2012  . Chest pain 05/04/2012  . COPD (chronic obstructive pulmonary disease) ([place]) 05/04/2012  . Diabetes mellitus, type 2 ([place]) 05/04/2012  . Bipolar disorder ([place]) 05/04/2012    Past Surgical History:  Procedure Laterality Date  . ABDOMINAL HYSTERECTOMY    . APPENDECTOMY    . CESAREAN SECTION    . CHOLECYSTECTOMY    . EYE SURGERY    . KNEE SURGERY      OB History    No data available       Home Medications    Prior to Admission medications   Medication Sig Start Date End Date Taking? Authorizing Provider  insulin glargine (LANTUS) 100 UNIT/ML injection Inject 20 Units into the skin 2 (two) times daily.   Yes  Historical Provider, MD  divalproex (DEPAKOTE ER) 500 MG 24 hr tablet Take 1 tablet (500 mg total) by mouth 2 (two) times daily. Patient not taking: Reported on 02/18/2016 04/01/15   Julianne Rice, MD  divalproex (DEPAKOTE ER) 500 MG 24 hr tablet Take 1 tablet (500 mg total) by mouth 2 (two) times daily. Patient not taking: Reported on 02/18/2016 10/05/15   Shawn C Joy, PA-C  divalproex (DEPAKOTE ER) 500 MG 24 hr tablet Take 1 tablet (500 mg total) by mouth 2 (two) times daily. 02/18/16   Fredia Sorrow, MD  HYDROcodone-acetaminophen (NORCO/VICODIN) 5-325 MG tablet Take 1 tablet by mouth every 4 (four) hours as needed for severe pain. Patient not taking: Reported on 02/18/2016 12/06/15   Olivia Canter Sam, PA-C  ibuprofen (ADVIL,MOTRIN) 600 MG tablet Take 1 tablet (600 mg total) by mouth every 6 (six) hours as needed. Patient not taking: Reported on 02/18/2016 12/06/15   Olivia Canter Sam, PA-C  insulin aspart protamine- aspart (NOVOLOG MIX 70/30) (70-30) 100 UNIT/ML injection Inject 0.1 mLs (10 Units total) into the skin 2 (two) times daily with a meal. Reported on 04/01/2015 Patient not taking: Reported on 02/18/2016 06/17/15   Domenic Moras, PA-C  metFORMIN (GLUCOPHAGE) 500 MG tablet Take 1 tablet (500 mg total) by mouth 2 (two) times daily with a meal.  Patient not taking: Reported on 02/18/2016 06/17/15   Domenic Moras, PA-C  methocarbamol (ROBAXIN) 500 MG tablet Take 1 tablet (500 mg total) by mouth 2 (two) times daily. Patient not taking: Reported on 02/18/2016 12/06/15   Anne Ng, PA-C    Family History Family History  Problem Relation Age of Onset  . Cancer Father     Social History Social History  Substance Use Topics  . Smoking status: Current Every Day Smoker    Packs/day: 0.50    Years: 30.00    Types: Cigarettes  . Smokeless tobacco: Never Used  . Alcohol use No     Allergies   Aspirin; Bee venom; Other; Naproxen; Toradol [ketorolac tromethamine]; Penicillins; Tramadol; and  Vancomycin   Review of Systems Review of Systems  Constitutional: Negative for fever.  HENT: Negative for congestion.   Eyes: Negative for visual disturbance.  Respiratory: Negative for shortness of breath.   Cardiovascular: Negative for chest pain.  Gastrointestinal: Negative for abdominal pain.  Genitourinary: Negative for dysuria.  Musculoskeletal: Negative for back pain and neck pain.  Skin: Negative for rash and wound.  Neurological: Positive for seizures and headaches.  Hematological: Does not bruise/bleed easily.  Psychiatric/Behavioral: Positive for confusion.     Physical Exam Updated Vital Signs BP 127/89 (BP Location: Right Arm)   Pulse 88   Temp 98.3 F (36.8 C) (Oral)   Resp 16   SpO2 100%   Physical Exam  Constitutional: She appears well-developed and well-nourished. No distress.  HENT:  Head: Normocephalic and atraumatic.  Mouth/Throat: Oropharynx is clear and moist.  Eyes: EOM are normal. Pupils are equal, round, and reactive to light.  Neck: Normal range of motion. Neck supple.  Cardiovascular: Normal rate, regular rhythm and normal heart sounds.   Pulmonary/Chest: Effort normal and breath sounds normal. No respiratory distress.  Abdominal: Soft. Bowel sounds are normal. There is no tenderness.  Musculoskeletal: Normal range of motion. She exhibits no edema.  Neurological: She is alert. No cranial nerve deficit or sensory deficit. She exhibits normal muscle tone. Coordination normal.  Skin: Skin is warm.  Nursing note and vitals reviewed.    ED Treatments / Results  Labs (all labs ordered are listed, but only abnormal results are displayed) Labs Reviewed  VALPROIC ACID LEVEL - Abnormal; Notable for the following:       Result Value   Valproic Acid Lvl <10 (*)    All other components within normal limits  COMPREHENSIVE METABOLIC PANEL - Abnormal; Notable for the following:    Glucose, Bld 116 (*)    AST 14 (*)    ALT 8 (*)    All other  components within normal limits  I-STAT CHEM 8, ED - Abnormal; Notable for the following:    Glucose, Bld 111 (*)    All other components within normal limits  CBC WITH DIFFERENTIAL/PLATELET  ETHANOL  RAPID URINE DRUG SCREEN, HOSP PERFORMED  LIPASE, BLOOD  MAGNESIUM  CBG MONITORING, ED    EKG  EKG Interpretation None       Radiology Ct Head Wo Contrast  Result Date: 02/18/2016 CLINICAL DATA:  50 year old female status post seizure and fall today. Initial encounter. EXAM: CT HEAD WITHOUT CONTRAST TECHNIQUE: Contiguous axial images were obtained from the base of the skull through the vertex without intravenous contrast. COMPARISON:  Head CT without contrast 07/03/2015 and earlier. FINDINGS: Brain: Cerebral volume is stable and within normal limits. No midline shift, ventriculomegaly, mass effect, evidence of mass lesion, intracranial hemorrhage  or evidence of cortically based acute infarction. Gray-white matter differentiation is within normal limits throughout the brain. Mild motion artifact. Vascular: Fairly extensive chronic Calcified atherosclerosis at the skull base. No suspicious intracranial vascular hyperdensity. Skull: Mild motion artifact. No acute osseous abnormality identified. Sinuses/Orbits: Visualized paranasal sinuses and mastoids are stable and well pneumatized. Other: Incidental left eyebrow piercing. Otherwise no acute orbit or scalp soft tissue finding. IMPRESSION: Stable and negative noncontrast CT appearance of the brain. Electronically Signed   By: Genevie Ann M.D.   On: 02/18/2016 18:20    Procedures Procedures (including critical care time)  Medications Ordered in ED Medications  0.9 %  sodium chloride infusion ( Intravenous New Bag/Given 02/18/16 1719)  divalproex (DEPAKOTE ER) 24 hr tablet 1,000 mg (1,000 mg Oral Given 02/18/16 1913)  sodium chloride 0.9 % bolus 500 mL (0 mLs Intravenous Stopped 02/18/16 1913)     Initial Impression / Assessment and Plan / ED  Course  I have reviewed the triage vital signs and the nursing notes.  Pertinent labs & imaging results that were available during my care of the patient were reviewed by me and considered in my medical decision making (see chart for details).  Clinical Course    Patient with known history of seizures. Patient brought in from around [employer] area. Patient is homeless. Patient also has history of diabetes.  Patient has not been taking any of her medicines. Also not taking in her diabetic medicines patient normally on Depakote ER 5 mg twice a day.  Workup here head CT negative labs without significant abnormalities. No evidence of any hyperglycemia or diabetes based on today's labs. Patient's Depakote level was consistent with her not taking any of her meds. Patient given of 1000 mg of Depakote ER here.  Patient insisted on leaving AMA before urinalysis and urine drug screen was back. Patient given prescription for Depakote ER 500 mg twice a day and referral to wellness clinic. Patient no she can return at any time. Patient technically cannot be formally discharged because lab was still pending.  Final Clinical Impressions(s) / ED Diagnoses   Final diagnoses:  Seizure ([place])    New Prescriptions New Prescriptions   DIVALPROEX (DEPAKOTE ER) 500 MG 24 HR TABLET    Take 1 tablet (500 mg total) by mouth 2 (two) times daily.     Fredia Sorrow, MD 02/18/16 2011

## 2016-02-18 NOTE — Discharge Instructions (Signed)
He can return at any time. Please take your Depakote as directed for the next several days. Recommend follow-up with the wellness clinic. We understand that she leaving AMA. Wellness clinic information provided above.

## 2016-02-18 NOTE — ED Notes (Signed)
Bed: WA06 Expected date:  Expected time:  Means of arrival:  Comments: EMS-SZ 

## 2016-02-18 NOTE — ED Notes (Signed)
Pt signed AMA due to not staying for completion of treatment has provided advised. Pt was given prescription medication and resources for treatment.

## 2016-02-18 NOTE — ED Notes (Signed)
Provided a [place] sandwich with permission from provider.

## 2016-02-18 NOTE — ED Notes (Signed)
Patient reminded again of the need for urine

## 2016-02-18 NOTE — ED Triage Notes (Signed)
Patient was picked from home and transported by [facility]. Patient's boyfriend reports she has a 15 second seizure. She is noncompliant with all her medications. Pt is alert and oriented x 4.

## 2016-03-07 ENCOUNTER — Emergency Department (HOSPITAL_COMMUNITY): Payer: Medicaid Other

## 2016-03-07 ENCOUNTER — Encounter (HOSPITAL_COMMUNITY): Payer: Self-pay | Admitting: Emergency Medicine

## 2016-03-07 ENCOUNTER — Emergency Department (HOSPITAL_COMMUNITY)
Admission: EM | Admit: 2016-03-07 | Discharge: 2016-03-07 | Disposition: A | Discharge status: Home / Self Care | Payer: Medicaid Other | Attending: Emergency Medicine | Admitting: Emergency Medicine

## 2016-03-07 DIAGNOSIS — E119 Type 2 diabetes mellitus without complications: Secondary | ICD-10-CM | POA: Insufficient documentation

## 2016-03-07 DIAGNOSIS — M5440 Lumbago with sciatica, unspecified side: Secondary | ICD-10-CM | POA: Insufficient documentation

## 2016-03-07 DIAGNOSIS — Z85818 Personal history of malignant neoplasm of other sites of lip, oral cavity, and pharynx: Secondary | ICD-10-CM | POA: Insufficient documentation

## 2016-03-07 DIAGNOSIS — F17210 Nicotine dependence, cigarettes, uncomplicated: Secondary | ICD-10-CM | POA: Insufficient documentation

## 2016-03-07 DIAGNOSIS — Z794 Long term (current) use of insulin: Secondary | ICD-10-CM | POA: Insufficient documentation

## 2016-03-07 DIAGNOSIS — J449 Chronic obstructive pulmonary disease, unspecified: Secondary | ICD-10-CM | POA: Diagnosis not present

## 2016-03-07 DIAGNOSIS — G40909 Epilepsy, unspecified, not intractable, without status epilepticus: Secondary | ICD-10-CM | POA: Diagnosis not present

## 2016-03-07 DIAGNOSIS — M545 Low back pain: Secondary | ICD-10-CM | POA: Diagnosis present

## 2016-03-07 DIAGNOSIS — R42 Dizziness and giddiness: Secondary | ICD-10-CM

## 2016-03-07 DIAGNOSIS — I2510 Atherosclerotic heart disease of native coronary artery without angina pectoris: Secondary | ICD-10-CM | POA: Diagnosis not present

## 2016-03-07 DIAGNOSIS — G8929 Other chronic pain: Secondary | ICD-10-CM

## 2016-03-07 DIAGNOSIS — I10 Essential (primary) hypertension: Secondary | ICD-10-CM | POA: Diagnosis not present

## 2016-03-07 DIAGNOSIS — R569 Unspecified convulsions: Secondary | ICD-10-CM

## 2016-03-07 LAB — CBC WITH DIFFERENTIAL/PLATELET
BASOS PCT: 0 %
Basophils Absolute: 0 10*3/uL (ref 0.0–0.1)
EOS ABS: 0.1 10*3/uL (ref 0.0–0.7)
EOS PCT: 1 %
HCT: 37.9 % (ref 36.0–46.0)
Hemoglobin: 12.9 g/dL (ref 12.0–15.0)
LYMPHS ABS: 2.9 10*3/uL (ref 0.7–4.0)
Lymphocytes Relative: 40 %
MCH: 31 pg (ref 26.0–34.0)
MCHC: 34 g/dL (ref 30.0–36.0)
MCV: 91.1 fL (ref 78.0–100.0)
MONO ABS: 0.4 10*3/uL (ref 0.1–1.0)
MONOS PCT: 6 %
Neutro Abs: 3.8 10*3/uL (ref 1.7–7.7)
Neutrophils Relative %: 53 %
Platelets: 191 10*3/uL (ref 150–400)
RBC: 4.16 MIL/uL (ref 3.87–5.11)
RDW: 12.4 % (ref 11.5–15.5)
WBC: 7.2 10*3/uL (ref 4.0–10.5)

## 2016-03-07 LAB — BASIC METABOLIC PANEL
Anion gap: 6 (ref 5–15)
BUN: 20 mg/dL (ref 6–20)
CALCIUM: 8.9 mg/dL (ref 8.9–10.3)
CHLORIDE: 101 mmol/L (ref 101–111)
CO2: 29 mmol/L (ref 22–32)
CREATININE: 0.71 mg/dL (ref 0.44–1.00)
GFR calc non Af Amer: >60 mL/min (ref >60)
GLUCOSE: 146 mg/dL — AB (ref 65–99)
Potassium: 3.7 mmol/L (ref 3.5–5.1)
Sodium: 136 mmol/L (ref 135–145)

## 2016-03-07 LAB — VALPROIC ACID LEVEL

## 2016-03-07 MED ORDER — ACETAMINOPHEN 500 MG PO TABS
1000.0000 mg | ORAL_TABLET | Freq: Once | ORAL | Status: DC
  Filled 2016-03-07 (×2): qty 2

## 2016-03-07 MED ORDER — DIVALPROEX SODIUM 500 MG PO DR TAB
500.0000 mg | DELAYED_RELEASE_TABLET | Freq: Two times a day (BID) | ORAL | Status: DC
  Administered 2016-03-07: 500 mg via ORAL
  Filled 2016-03-07: qty 1

## 2016-03-07 NOTE — ED Triage Notes (Signed)
Per EMS pt was picked from street co chronic back,neck and leg pain, numbness x 7 months, seizure like activity , dizziness . Pt alert and oriented at the scene per EMS. Ambulatory as well.

## 2016-03-07 NOTE — ED Notes (Signed)
Bed: WA21 Expected date:  Expected time:  Means of arrival:  Comments: 50 yo seizure; chronic pain

## 2016-03-07 NOTE — ED Provider Notes (Signed)
[facility] DEPT Provider Note   CSN: [id number] Arrival date & time: 03/07/16  1209     History   Chief Complaint Chief Complaint  Patient presents with  . multiple complaints    HPI Zaeda Loren is a 50 y.o. female.  Patient is a 50 year old homeless female with a history of diabetes, hypertension, seizures, COPD and schizophrenia who presents with multiple complaints. States that her fianc said that she had 3 seizures during the night but they were short. She didn't fall or injure herself. She has chronic back pain and presents with pain to her lower back that radiates down both of her legs. She has some intermittent numbness to her BILATERALLY. No weakness to legs. She has chronic knee pain and is requesting a brace for her knee. She's noted that she has pain to her right elbow. She denies any recent trauma to the area. She states that she feels lightheaded at times and has intermittent headaches. No fevers. She has an ongoing cough which is productive of yellow sputum. No shortness of breath. No nausea vomiting or diarrhea. No urinary symptoms. She states she has been taking her Depakote as directed. She does check her blood sugar and states her blood sugars been staying under 200. She currently is assigned a primary care provider in [place] but is unable to get there. She is attempting to have her Medicaid changed to the [place]. However she does say that she moving to [place] on December 29.      Past Medical History:  Diagnosis Date  . Addiction to drug ([place])   . Cancer (HCC) throat  . COPD (chronic obstructive pulmonary disease) ([place])   . Coronary artery disease   . Diabetes mellitus without complication ([place])   . Hypertension   . Migraine   . Obesity   . Schizophrenia ([place])   . Seizures ([place])   . Sleep apnea   . Throat cancer [facility])     Patient Active Problem List   Diagnosis Date Noted  . Mastitis in female 07/05/2012  .  Dehydration 07/05/2012  . Seizure disorder ([place]) 07/05/2012  . Chest pain 05/04/2012  . COPD (chronic obstructive pulmonary disease) ([place]) 05/04/2012  . Diabetes mellitus, type 2 ([place]) 05/04/2012  . Bipolar disorder ([place]) 05/04/2012    Past Surgical History:  Procedure Laterality Date  . ABDOMINAL HYSTERECTOMY    . APPENDECTOMY    . CESAREAN SECTION    . CHOLECYSTECTOMY    . EYE SURGERY    . KNEE SURGERY      OB History    No data available       Home Medications    Prior to Admission medications   Medication Sig Start Date End Date Taking? Authorizing Provider  acetaminophen (TYLENOL) 500 MG tablet Take 2,000 mg by mouth every 6 (six) hours as needed.   Yes Historical Provider, MD  divalproex (DEPAKOTE ER) 500 MG 24 hr tablet Take 1 tablet (500 mg total) by mouth 2 (two) times daily. 04/01/15  Yes Julianne Rice, MD  divalproex (DEPAKOTE ER) 500 MG 24 hr tablet Take 1 tablet (500 mg total) by mouth 2 (two) times daily. Patient not taking: Reported on 02/18/2016 10/05/15   Shawn C Joy, PA-C  divalproex (DEPAKOTE ER) 500 MG 24 hr tablet Take 1 tablet (500 mg total) by mouth 2 (two) times daily. Patient not taking: Reported on 03/07/2016 02/18/16   Fredia Sorrow, MD  HYDROcodone-acetaminophen (NORCO/VICODIN) 5-325 MG tablet Take 1  tablet by mouth every 4 (four) hours as needed for severe pain. Patient not taking: Reported on 03/07/2016 12/06/15   Olivia Canter Sam, PA-C  ibuprofen (ADVIL,MOTRIN) 600 MG tablet Take 1 tablet (600 mg total) by mouth every 6 (six) hours as needed. Patient not taking: Reported on 03/07/2016 12/06/15   Olivia Canter Sam, PA-C  insulin aspart protamine- aspart (NOVOLOG MIX 70/30) (70-30) 100 UNIT/ML injection Inject 0.1 mLs (10 Units total) into the skin 2 (two) times daily with a meal. Reported on 04/01/2015 Patient not taking: Reported on 03/07/2016 06/17/15   Domenic Moras, PA-C  insulin glargine (LANTUS) 100 UNIT/ML injection Inject 20 Units into the skin 2 (two)  times daily.    Historical Provider, MD  metFORMIN (GLUCOPHAGE) 500 MG tablet Take 1 tablet (500 mg total) by mouth 2 (two) times daily with a meal. Patient not taking: Reported on 03/07/2016 06/17/15   Domenic Moras, PA-C  methocarbamol (ROBAXIN) 500 MG tablet Take 1 tablet (500 mg total) by mouth 2 (two) times daily. Patient not taking: Reported on 03/07/2016 12/06/15   Anne Ng, PA-C    Family History Family History  Problem Relation Age of Onset  . Cancer Father     Social History Social History  Substance Use Topics  . Smoking status: Current Every Day Smoker    Packs/day: 0.50    Years: 30.00    Types: Cigarettes  . Smokeless tobacco: Never Used  . Alcohol use No     Allergies   Aspirin; Bee venom; Other; Naproxen; Toradol [ketorolac tromethamine]; Penicillins; Tramadol; and Vancomycin   Review of Systems Review of Systems  Constitutional: Positive for fatigue. Negative for chills, diaphoresis and fever.  HENT: Negative for congestion, rhinorrhea and sneezing.   Eyes: Negative.   Respiratory: Negative for cough, chest tightness and shortness of breath.   Cardiovascular: Negative for chest pain and leg swelling.  Gastrointestinal: Negative for abdominal pain, blood in stool, diarrhea, nausea and vomiting.  Genitourinary: Negative for difficulty urinating, flank pain, frequency and hematuria.  Musculoskeletal: Positive for arthralgias and back pain.  Skin: Negative for rash.  Neurological: Positive for dizziness, light-headedness, numbness and headaches. Negative for speech difficulty and weakness.     Physical Exam Updated Vital Signs BP 143/64 (BP Location: Right Arm)   Pulse 65   Temp 98.8 F (37.1 C) (Oral)   Resp 18   SpO2 99%   Physical Exam  Constitutional: She is oriented to person, place, and time. She appears well-developed and well-nourished.  HENT:  Head: Normocephalic and atraumatic.  Eyes: Pupils are equal, round, and reactive to light.    Neck: Normal range of motion. Neck supple.  Cardiovascular: Normal rate, regular rhythm and normal heart sounds.   Pulmonary/Chest: Effort normal and breath sounds normal. No respiratory distress. She has no wheezes. She has no rales. She exhibits no tenderness.  Abdominal: Soft. Bowel sounds are normal. There is no tenderness. There is no rebound and no guarding.  Musculoskeletal: Normal range of motion. She exhibits no edema.  Pain diffusely across her lumbar region and along the lumbar spine. No step-offs or deformities. Neurologically intact. Negative straight leg raise bilaterally She has pain on palpation of both her knees but no effusion. No warmth or erythema. She has pain to her right elbow over the olecranon.  Lymphadenopathy:    She has no cervical adenopathy.  Neurological: She is alert and oriented to person, place, and time.  Motor 5 out of 5 all show knees, sensation  grossly intact to light touch all extremities  Skin: Skin is warm and dry. No rash noted.  Psychiatric: She has a normal mood and affect.     ED Treatments / Results  Labs (all labs ordered are listed, but only abnormal results are displayed) Labs Reviewed  BASIC METABOLIC PANEL - Abnormal; Notable for the following:       Result Value   Glucose, Bld 146 (*)    All other components within normal limits  VALPROIC ACID LEVEL - Abnormal; Notable for the following:    Valproic Acid Lvl <10 (*)    All other components within normal limits  CBC WITH DIFFERENTIAL/PLATELET    EKG  EKG Interpretation None       Radiology Dg Chest 2 View  Result Date: 03/07/2016 CLINICAL DATA:  Cough. EXAM: CHEST  2 VIEW COMPARISON:  01/06/2016 FINDINGS: The heart size and mediastinal contours are within normal limits. Both lungs are clear. The visualized skeletal structures are unremarkable. IMPRESSION: No active cardiopulmonary disease. Electronically Signed   By: Earle Gell M.D.   On: 03/07/2016 13:37   Dg Elbow  Complete Right  Result Date: 03/07/2016 CLINICAL DATA:  Right elbow pain. EXAM: RIGHT ELBOW - COMPLETE 3+ VIEW COMPARISON:  None. FINDINGS: There is no evidence of fracture, dislocation, or joint effusion. There is no evidence of arthropathy or other focal bone abnormality. Soft tissues are unremarkable. IMPRESSION: Negative. Electronically Signed   By: Earle Gell M.D.   On: 03/07/2016 13:36    Procedures Procedures (including critical care time)  Medications Ordered in ED Medications  acetaminophen (TYLENOL) tablet 1,000 mg (1,000 mg Oral Refused 03/07/16 1255)  divalproex (DEPAKOTE) DR tablet 500 mg (not administered)     Initial Impression / Assessment and Plan / ED Course  I have reviewed the triage vital signs and the nursing notes.  Pertinent labs & imaging results that were available during my care of the patient were reviewed by me and considered in my medical decision making (see chart for details).  Clinical Course     Patient presents with multiple complaints. She has chronic back pain which is unchanged from baseline. She does have some radicular symptoms but no neurologic deficits or signs of cauda equina. She has pain in her right elbow. X-rays are unremarkable. There is no physical findings of cellulitis or other concerns. She did report seizures during the night. She states that she's taking her Depakote as prescribed by her Depakote level is 0. She was given a dose of Depakote in the ED and encouraged to take it as prescribed. She was encouraged to follow-up with her PCP. She is currently in the process of trying to change it however she is also moving to [place] this month. She was advised return to emergency department if she has any worsening symptoms.  Final Clinical Impressions(s) / ED Diagnoses   Final diagnoses:  Chronic low back pain with sciatica, sciatica laterality unspecified, unspecified back pain laterality  Dizziness  Seizure [facility])    New  Prescriptions New Prescriptions   No medications on file     Malvin Johns, MD 03/07/16 1510

## 2016-03-31 ENCOUNTER — Emergency Department (HOSPITAL_COMMUNITY)
Admission: EM | Admit: 2016-03-31 | Discharge: 2016-04-01 | Disposition: A | Discharge status: Home / Self Care | Payer: Medicaid Other | Attending: Dermatology | Admitting: Dermatology

## 2016-03-31 ENCOUNTER — Encounter (HOSPITAL_COMMUNITY): Payer: Self-pay

## 2016-03-31 DIAGNOSIS — Z5321 Procedure and treatment not carried out due to patient leaving prior to being seen by health care provider: Secondary | ICD-10-CM | POA: Diagnosis not present

## 2016-03-31 DIAGNOSIS — Z794 Long term (current) use of insulin: Secondary | ICD-10-CM | POA: Diagnosis not present

## 2016-03-31 DIAGNOSIS — R51 Headache: Secondary | ICD-10-CM | POA: Diagnosis not present

## 2016-03-31 DIAGNOSIS — I10 Essential (primary) hypertension: Secondary | ICD-10-CM | POA: Diagnosis not present

## 2016-03-31 DIAGNOSIS — I2510 Atherosclerotic heart disease of native coronary artery without angina pectoris: Secondary | ICD-10-CM | POA: Diagnosis not present

## 2016-03-31 DIAGNOSIS — Z79899 Other long term (current) drug therapy: Secondary | ICD-10-CM | POA: Diagnosis not present

## 2016-03-31 DIAGNOSIS — F17210 Nicotine dependence, cigarettes, uncomplicated: Secondary | ICD-10-CM | POA: Insufficient documentation

## 2016-03-31 DIAGNOSIS — Z85818 Personal history of malignant neoplasm of other sites of lip, oral cavity, and pharynx: Secondary | ICD-10-CM | POA: Diagnosis not present

## 2016-03-31 DIAGNOSIS — J449 Chronic obstructive pulmonary disease, unspecified: Secondary | ICD-10-CM | POA: Diagnosis not present

## 2016-03-31 DIAGNOSIS — E119 Type 2 diabetes mellitus without complications: Secondary | ICD-10-CM | POA: Diagnosis not present
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
## 2016-03-31 NOTE — ED Notes (Signed)
Pt no answer when called for vitals to be reassessed.

## 2016-04-01 NOTE — ED Notes (Signed)
Pt called multiples for vital signs recheck and no response.

## 2016-04-29 ENCOUNTER — Emergency Department (HOSPITAL_COMMUNITY)
Admission: EM | Admit: 2016-04-29 | Discharge: 2016-04-29 | Disposition: A | Discharge status: Home / Self Care | Payer: Medicaid Other | Attending: Dermatology | Admitting: Dermatology

## 2016-04-29 DIAGNOSIS — Z5321 Procedure and treatment not carried out due to patient leaving prior to being seen by health care provider: Secondary | ICD-10-CM | POA: Diagnosis not present

## 2016-04-29 DIAGNOSIS — R51 Headache: Secondary | ICD-10-CM | POA: Insufficient documentation

## 2016-04-29 NOTE — ED Notes (Signed)
Pt not in triage room and unable to locate.  She told NS she had to use bathroom and hasn't returned.

## 2016-04-29 NOTE — ED Notes (Signed)
Patient called in main ED waiting area room with no response 

## 2016-05-11 ENCOUNTER — Emergency Department
Admission: EM | Admit: 2016-05-11 | Discharge: 2016-05-11 | Disposition: A | Discharge status: Home / Self Care | Payer: Medicaid Other | Attending: Emergency Medicine | Admitting: Emergency Medicine

## 2016-05-11 DIAGNOSIS — Z794 Long term (current) use of insulin: Secondary | ICD-10-CM | POA: Insufficient documentation

## 2016-05-11 DIAGNOSIS — J449 Chronic obstructive pulmonary disease, unspecified: Secondary | ICD-10-CM | POA: Insufficient documentation

## 2016-05-11 DIAGNOSIS — F17210 Nicotine dependence, cigarettes, uncomplicated: Secondary | ICD-10-CM | POA: Insufficient documentation

## 2016-05-11 DIAGNOSIS — R112 Nausea with vomiting, unspecified: Secondary | ICD-10-CM | POA: Diagnosis present

## 2016-05-11 DIAGNOSIS — Z85818 Personal history of malignant neoplasm of other sites of lip, oral cavity, and pharynx: Secondary | ICD-10-CM | POA: Diagnosis not present

## 2016-05-11 DIAGNOSIS — I10 Essential (primary) hypertension: Secondary | ICD-10-CM | POA: Diagnosis not present

## 2016-05-11 DIAGNOSIS — I2510 Atherosclerotic heart disease of native coronary artery without angina pectoris: Secondary | ICD-10-CM | POA: Insufficient documentation

## 2016-05-11 DIAGNOSIS — E119 Type 2 diabetes mellitus without complications: Secondary | ICD-10-CM | POA: Insufficient documentation

## 2016-05-11 DIAGNOSIS — K529 Noninfective gastroenteritis and colitis, unspecified: Secondary | ICD-10-CM | POA: Diagnosis not present

## 2016-05-11 DIAGNOSIS — Z79899 Other long term (current) drug therapy: Secondary | ICD-10-CM | POA: Diagnosis not present

## 2016-05-11 DIAGNOSIS — R197 Diarrhea, unspecified: Secondary | ICD-10-CM

## 2016-05-11 LAB — CBC
HCT: 42.2 % (ref 35.0–47.0)
HEMOGLOBIN: 14.3 g/dL (ref 12.0–16.0)
MCH: 30.2 pg (ref 26.0–34.0)
MCHC: 34 g/dL (ref 32.0–36.0)
MCV: 88.8 fL (ref 80.0–100.0)
PLATELETS: 190 10*3/uL (ref 150–440)
RBC: 4.75 MIL/uL (ref 3.80–5.20)
RDW: 13.1 % (ref 11.5–14.5)
WBC: 12.2 10*3/uL — AB (ref 3.6–11.0)

## 2016-05-11 LAB — URINALYSIS, COMPLETE (UACMP) WITH MICROSCOPIC
BILIRUBIN URINE: NEGATIVE
GLUCOSE, UA: NEGATIVE mg/dL
KETONES UR: NEGATIVE mg/dL
LEUKOCYTES UA: NEGATIVE
Nitrite: NEGATIVE
PROTEIN: NEGATIVE mg/dL
Specific Gravity, Urine: 1.016 (ref 1.005–1.030)
pH: 6 (ref 5.0–8.0)

## 2016-05-11 LAB — COMPREHENSIVE METABOLIC PANEL
ALT: 9 U/L — ABNORMAL LOW (ref 14–54)
ANION GAP: 8 (ref 5–15)
AST: 23 U/L (ref 15–41)
Albumin: 4 g/dL (ref 3.5–5.0)
Alkaline Phosphatase: 82 U/L (ref 38–126)
BUN: 19 mg/dL (ref 6–20)
CALCIUM: 9.1 mg/dL (ref 8.9–10.3)
CHLORIDE: 103 mmol/L (ref 101–111)
CO2: 25 mmol/L (ref 22–32)
Creatinine, Ser: 0.49 mg/dL (ref 0.44–1.00)
Glucose, Bld: 168 mg/dL — ABNORMAL HIGH (ref 65–99)
Potassium: 4.7 mmol/L (ref 3.5–5.1)
SODIUM: 136 mmol/L (ref 135–145)
Total Bilirubin: 0.6 mg/dL (ref 0.3–1.2)
Total Protein: 7.4 g/dL (ref 6.5–8.1)

## 2016-05-11 LAB — LIPASE, BLOOD: LIPASE: 33 U/L (ref 11–51)

## 2016-05-11 MED ORDER — DIVALPROEX SODIUM ER 500 MG PO TB24: 500.0000 mg | ORAL_TABLET | Freq: Two times a day (BID) | ORAL | 1 refills | Status: DC

## 2016-05-11 MED ORDER — FAMOTIDINE IN NACL 20-0.9 MG/50ML-% IV SOLN
20.0000 mg | Freq: Once | INTRAVENOUS | Status: AC
  Administered 2016-05-11: 20 mg via INTRAVENOUS
  Filled 2016-05-11: qty 50

## 2016-05-11 MED ORDER — NITROGLYCERIN 0.4 MG SL SUBL: 0.4000 mg | SUBLINGUAL_TABLET | SUBLINGUAL | 0 refills | Status: AC | PRN

## 2016-05-11 MED ORDER — ONDANSETRON HCL 4 MG PO TABS: 4.0000 mg | ORAL_TABLET | Freq: Three times a day (TID) | ORAL | 0 refills | Status: AC | PRN

## 2016-05-11 MED ORDER — METFORMIN HCL 500 MG PO TABS: 1000.0000 mg | ORAL_TABLET | Freq: Two times a day (BID) | ORAL | 1 refills | Status: DC

## 2016-05-11 MED ORDER — ALBUTEROL SULFATE HFA 108 (90 BASE) MCG/ACT IN AERS: 2.0000 | INHALATION_SPRAY | Freq: Four times a day (QID) | RESPIRATORY_TRACT | 2 refills | Status: AC | PRN

## 2016-05-11 MED ORDER — ONDANSETRON HCL 4 MG/2ML IJ SOLN
4.0000 mg | Freq: Once | INTRAMUSCULAR | Status: AC
  Administered 2016-05-11: 4 mg via INTRAVENOUS
  Filled 2016-05-11: qty 2

## 2016-05-11 MED ORDER — LISINOPRIL 20 MG PO TABS: 20.0000 mg | ORAL_TABLET | Freq: Every day | ORAL | 1 refills | Status: DC

## 2016-05-11 MED ORDER — SODIUM CHLORIDE 0.9 % IV BOLUS (SEPSIS)
1000.0000 mL | Freq: Once | INTRAVENOUS | Status: AC
  Administered 2016-05-11: 1000 mL via INTRAVENOUS

## 2016-05-11 NOTE — ED Triage Notes (Addendum)
[facility]/D,,pt also states she has been out of regular meds including insulin for the past month. FS 164
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
## 2016-05-11 NOTE — ED Notes (Signed)
Pt asking when she will be able to go home, EDP states as soon as pt is able to tolerate PO fluids.. Pt given sprite to drink for PO challenge.

## 2016-06-28 ENCOUNTER — Emergency Department (HOSPITAL_COMMUNITY): Payer: Medicaid Other

## 2016-06-28 ENCOUNTER — Emergency Department (HOSPITAL_COMMUNITY)
Admission: EM | Admit: 2016-06-28 | Discharge: 2016-06-28 | Disposition: A | Discharge status: Home / Self Care | Payer: Medicaid Other | Attending: Emergency Medicine | Admitting: Emergency Medicine

## 2016-06-28 DIAGNOSIS — I2510 Atherosclerotic heart disease of native coronary artery without angina pectoris: Secondary | ICD-10-CM | POA: Insufficient documentation

## 2016-06-28 DIAGNOSIS — J449 Chronic obstructive pulmonary disease, unspecified: Secondary | ICD-10-CM | POA: Insufficient documentation

## 2016-06-28 DIAGNOSIS — Z859 Personal history of malignant neoplasm, unspecified: Secondary | ICD-10-CM | POA: Diagnosis not present

## 2016-06-28 DIAGNOSIS — Z7984 Long term (current) use of oral hypoglycemic drugs: Secondary | ICD-10-CM | POA: Diagnosis not present

## 2016-06-28 DIAGNOSIS — E119 Type 2 diabetes mellitus without complications: Secondary | ICD-10-CM | POA: Diagnosis not present

## 2016-06-28 DIAGNOSIS — F17210 Nicotine dependence, cigarettes, uncomplicated: Secondary | ICD-10-CM | POA: Insufficient documentation

## 2016-06-28 DIAGNOSIS — Z79899 Other long term (current) drug therapy: Secondary | ICD-10-CM | POA: Insufficient documentation

## 2016-06-28 DIAGNOSIS — Z85818 Personal history of malignant neoplasm of other sites of lip, oral cavity, and pharynx: Secondary | ICD-10-CM | POA: Insufficient documentation

## 2016-06-28 DIAGNOSIS — R519 Headache, unspecified: Secondary | ICD-10-CM

## 2016-06-28 DIAGNOSIS — R51 Headache: Secondary | ICD-10-CM | POA: Diagnosis not present

## 2016-06-28 LAB — BASIC METABOLIC PANEL
Anion gap: 8 (ref 5–15)
BUN: 14 mg/dL (ref 6–20)
CHLORIDE: 103 mmol/L (ref 101–111)
CO2: 26 mmol/L (ref 22–32)
Calcium: 9.3 mg/dL (ref 8.9–10.3)
Creatinine, Ser: 0.58 mg/dL (ref 0.44–1.00)
GFR calc non Af Amer: >60 mL/min (ref >60)
Glucose, Bld: 194 mg/dL — ABNORMAL HIGH (ref 65–99)
POTASSIUM: 3.9 mmol/L (ref 3.5–5.1)
Sodium: 137 mmol/L (ref 135–145)

## 2016-06-28 LAB — CBC WITH DIFFERENTIAL/PLATELET
Basophils Absolute: 0 10*3/uL (ref 0.0–0.1)
Basophils Relative: 0 %
Eosinophils Absolute: 0.1 10*3/uL (ref 0.0–0.7)
Eosinophils Relative: 2 %
HEMATOCRIT: 39.7 % (ref 36.0–46.0)
HEMOGLOBIN: 13.5 g/dL (ref 12.0–15.0)
LYMPHS ABS: 4 10*3/uL (ref 0.7–4.0)
Lymphocytes Relative: 46 %
MCH: 30.4 pg (ref 26.0–34.0)
MCHC: 34 g/dL (ref 30.0–36.0)
MCV: 89.4 fL (ref 78.0–100.0)
MONOS PCT: 7 %
Monocytes Absolute: 0.7 10*3/uL (ref 0.1–1.0)
NEUTROS PCT: 45 %
Neutro Abs: 4 10*3/uL (ref 1.7–7.7)
Platelets: 199 10*3/uL (ref 150–400)
RBC: 4.44 MIL/uL (ref 3.87–5.11)
RDW: 12.9 % (ref 11.5–15.5)
WBC: 8.8 10*3/uL (ref 4.0–10.5)

## 2016-06-28 LAB — CBG MONITORING, ED: Glucose-Capillary: 150 mg/dL — ABNORMAL HIGH (ref 65–99)

## 2016-06-28 MED ORDER — SODIUM CHLORIDE 0.9 % IV BOLUS (SEPSIS)
1000.0000 mL | Freq: Once | INTRAVENOUS | Status: AC
  Administered 2016-06-28: 1000 mL via INTRAVENOUS

## 2016-06-28 MED ORDER — DIPHENHYDRAMINE HCL 50 MG/ML IJ SOLN
25.0000 mg | Freq: Once | INTRAMUSCULAR | Status: AC
  Administered 2016-06-28: 25 mg via INTRAVENOUS
  Filled 2016-06-28: qty 1

## 2016-06-28 MED ORDER — PROCHLORPERAZINE EDISYLATE 5 MG/ML IJ SOLN
10.0000 mg | Freq: Once | INTRAMUSCULAR | Status: AC
  Administered 2016-06-28: 10 mg via INTRAVENOUS
  Filled 2016-06-28 (×2): qty 2

## 2016-06-28 NOTE — Discharge Instructions (Signed)
Read the information below.  You may return to the Emergency Department at any time for worsening condition or any new symptoms that concern you. °
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
## 2016-06-28 NOTE — ED Notes (Signed)
Pt ambulated to BR without difficulty

## 2016-06-28 NOTE — ED Notes (Signed)
Patient transported to CT 
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
## 2016-06-28 NOTE — ED Notes (Signed)
Pt stated "I'm homeless.  [place].  I didn't want to come because I knew I wouldn't be out by 6 and the buses stop running then.  I'm from [place].  I don't have any money until the 1st of next month.  I had some stuff to take care of this month with my money."

## 2016-07-16 ENCOUNTER — Encounter (HOSPITAL_COMMUNITY): Payer: Self-pay | Admitting: *Deleted

## 2016-07-16 ENCOUNTER — Emergency Department (HOSPITAL_COMMUNITY)
Admission: EM | Admit: 2016-07-16 | Discharge: 2016-07-16 | Disposition: A | Discharge status: Home / Self Care | Payer: Medicaid Other | Attending: Emergency Medicine | Admitting: Emergency Medicine

## 2016-07-16 DIAGNOSIS — J449 Chronic obstructive pulmonary disease, unspecified: Secondary | ICD-10-CM | POA: Insufficient documentation

## 2016-07-16 DIAGNOSIS — N899 Noninflammatory disorder of vagina, unspecified: Secondary | ICD-10-CM | POA: Diagnosis present

## 2016-07-16 DIAGNOSIS — E119 Type 2 diabetes mellitus without complications: Secondary | ICD-10-CM | POA: Diagnosis not present

## 2016-07-16 DIAGNOSIS — N760 Acute vaginitis: Secondary | ICD-10-CM

## 2016-07-16 DIAGNOSIS — A599 Trichomoniasis, unspecified: Secondary | ICD-10-CM

## 2016-07-16 DIAGNOSIS — I10 Essential (primary) hypertension: Secondary | ICD-10-CM | POA: Diagnosis not present

## 2016-07-16 DIAGNOSIS — Z85819 Personal history of malignant neoplasm of unspecified site of lip, oral cavity, and pharynx: Secondary | ICD-10-CM | POA: Insufficient documentation

## 2016-07-16 DIAGNOSIS — I2510 Atherosclerotic heart disease of native coronary artery without angina pectoris: Secondary | ICD-10-CM | POA: Insufficient documentation

## 2016-07-16 DIAGNOSIS — A5901 Trichomonal vulvovaginitis: Secondary | ICD-10-CM | POA: Insufficient documentation

## 2016-07-16 DIAGNOSIS — Z711 Person with feared health complaint in whom no diagnosis is made: Secondary | ICD-10-CM

## 2016-07-16 DIAGNOSIS — B9689 Other specified bacterial agents as the cause of diseases classified elsewhere: Secondary | ICD-10-CM

## 2016-07-16 DIAGNOSIS — Z794 Long term (current) use of insulin: Secondary | ICD-10-CM | POA: Insufficient documentation

## 2016-07-16 DIAGNOSIS — N3001 Acute cystitis with hematuria: Secondary | ICD-10-CM

## 2016-07-16 DIAGNOSIS — F17210 Nicotine dependence, cigarettes, uncomplicated: Secondary | ICD-10-CM | POA: Insufficient documentation

## 2016-07-16 DIAGNOSIS — N898 Other specified noninflammatory disorders of vagina: Secondary | ICD-10-CM

## 2016-07-16 LAB — BASIC METABOLIC PANEL
Anion gap: 9 (ref 5–15)
BUN: 18 mg/dL (ref 6–20)
CALCIUM: 9.4 mg/dL (ref 8.9–10.3)
CO2: 26 mmol/L (ref 22–32)
CREATININE: 0.71 mg/dL (ref 0.44–1.00)
Chloride: 101 mmol/L (ref 101–111)
GFR calc Af Amer: >60 mL/min (ref >60)
GFR calc non Af Amer: >60 mL/min (ref >60)
GLUCOSE: 180 mg/dL — AB (ref 65–99)
Potassium: 3.8 mmol/L (ref 3.5–5.1)
Sodium: 136 mmol/L (ref 135–145)

## 2016-07-16 LAB — URINALYSIS, ROUTINE W REFLEX MICROSCOPIC
Bilirubin Urine: NEGATIVE
GLUCOSE, UA: 150 mg/dL — AB
KETONES UR: NEGATIVE mg/dL
NITRITE: NEGATIVE
PROTEIN: NEGATIVE mg/dL
Specific Gravity, Urine: 1.029 (ref 1.005–1.030)
pH: 5 (ref 5.0–8.0)

## 2016-07-16 LAB — CBC WITH DIFFERENTIAL/PLATELET
BASOS PCT: 0 %
Basophils Absolute: 0 10*3/uL (ref 0.0–0.1)
EOS ABS: 0.1 10*3/uL (ref 0.0–0.7)
Eosinophils Relative: 1 %
HEMATOCRIT: 42.9 % (ref 36.0–46.0)
Hemoglobin: 14.4 g/dL (ref 12.0–15.0)
LYMPHS ABS: 3.3 10*3/uL (ref 0.7–4.0)
Lymphocytes Relative: 34 %
MCH: 30.4 pg (ref 26.0–34.0)
MCHC: 33.6 g/dL (ref 30.0–36.0)
MCV: 90.7 fL (ref 78.0–100.0)
MONO ABS: 0.7 10*3/uL (ref 0.1–1.0)
MONOS PCT: 7 %
Neutro Abs: 5.7 10*3/uL (ref 1.7–7.7)
Neutrophils Relative %: 58 %
Platelets: 200 10*3/uL (ref 150–400)
RBC: 4.73 MIL/uL (ref 3.87–5.11)
RDW: 13 % (ref 11.5–15.5)
WBC: 9.7 10*3/uL (ref 4.0–10.5)

## 2016-07-16 LAB — WET PREP, GENITAL
Sperm: NONE SEEN
Yeast Wet Prep HPF POC: NONE SEEN

## 2016-07-16 MED ORDER — AZITHROMYCIN 250 MG PO TABS
1000.0000 mg | ORAL_TABLET | Freq: Once | ORAL | Status: AC
  Administered 2016-07-16: 1000 mg via ORAL
  Filled 2016-07-16: qty 4

## 2016-07-16 MED ORDER — METRONIDAZOLE 500 MG PO TABS
2000.0000 mg | ORAL_TABLET | Freq: Once | ORAL | Status: AC
  Administered 2016-07-16: 2000 mg via ORAL
  Filled 2016-07-16: qty 4

## 2016-07-16 MED ORDER — CEPHALEXIN 500 MG PO CAPS: 500.0000 mg | ORAL_CAPSULE | Freq: Three times a day (TID) | ORAL | 0 refills | Status: AC

## 2016-07-16 MED ORDER — METRONIDAZOLE 500 MG PO TABS: 500.0000 mg | ORAL_TABLET | Freq: Two times a day (BID) | ORAL | 0 refills | Status: DC

## 2016-07-16 MED ORDER — LIDOCAINE HCL 1 % IJ SOLN
INTRAMUSCULAR | Status: AC
  Administered 2016-07-16: 20 mL
  Filled 2016-07-16: qty 20

## 2016-07-16 MED ORDER — FLUCONAZOLE 150 MG PO TABS: 150.0000 mg | ORAL_TABLET | Freq: Once | ORAL | 0 refills | Status: AC

## 2016-07-16 MED ORDER — CEFTRIAXONE SODIUM 250 MG IJ SOLR
250.0000 mg | Freq: Once | INTRAMUSCULAR | Status: AC
  Administered 2016-07-16: 250 mg via INTRAMUSCULAR
  Filled 2016-07-16: qty 250

## 2016-07-16 NOTE — ED Triage Notes (Signed)
Per EMS, pt complains of abdominal pain x 3 days and whitish yellow vaginal discharge. Pt believes she has yeast infection.

## 2016-07-16 NOTE — ED Provider Notes (Signed)
[place] DEPT Provider Note   CSN: [id number] Arrival date & time: 07/16/16  1218     History   Chief Complaint Chief Complaint  Patient presents with  . Abdominal Pain  . Vaginal Discharge    HPI Carol Hughes is a 51 y.o. female.  Carol Hughes is a 51 y.o. Female with a history of DM who presents to the ED complaining of dysuria and vaginal discharge for the past 3 days. Patient tells me she thinks she has a yeast infection and a urinary tract infection. She reports whitish vaginal discharge with itching. She also reports dysuria, urinary frequency and urgency. She reports some mild suprapubic pain today. She denies nausea, vomiting or diarrhea. She's been compliant with her medications. Previous surgical history includes a hysterectomy. She denies fevers, nausea, vomiting, diarrhea, rashes, hematuria, difficulty urinating.    The history is provided by the patient and medical records. No language interpreter was used.  Abdominal Pain   Associated symptoms include dysuria and frequency. Pertinent negatives include fever, diarrhea, nausea, vomiting, hematuria and headaches.  Vaginal Discharge   Associated symptoms include abdominal pain, dysuria and frequency. Pertinent negatives include no fever, no diarrhea, no nausea and no vomiting.    Past Medical History:  Diagnosis Date  . Addiction to drug ([place])   . Cancer (HCC) throat  . COPD (chronic obstructive pulmonary disease) ([place])   . Coronary artery disease   . Diabetes mellitus without complication ([place])   . Hypertension   . Migraine   . Obesity   . Schizophrenia ([place])   . Seizures ([place])   . Sleep apnea   . Throat cancer [facility])     Patient Active Problem List   Diagnosis Date Noted  . Mastitis in female 07/05/2012  . Dehydration 07/05/2012  . Seizure disorder ([place]) 07/05/2012  . Chest pain 05/04/2012  . COPD (chronic obstructive pulmonary disease) ([place]) 05/04/2012  . Diabetes mellitus, type 2 ([place])  05/04/2012  . Bipolar disorder ([place]) 05/04/2012    Past Surgical History:  Procedure Laterality Date  . ABDOMINAL HYSTERECTOMY    . APPENDECTOMY    . CESAREAN SECTION    . CHOLECYSTECTOMY    . EYE SURGERY    . KNEE SURGERY      OB History    No data available       Home Medications    Prior to Admission medications   Medication Sig Start Date End Date Taking? Authorizing Provider  albuterol (PROVENTIL HFA;VENTOLIN HFA) 108 (90 Base) MCG/ACT inhaler Inhale 2 puffs into the lungs every 6 (six) hours as needed for wheezing or shortness of breath. 05/11/16  Yes Rudene Re, MD  divalproex (DEPAKOTE ER) 500 MG 24 hr tablet Take 1 tablet (500 mg total) by mouth 2 (two) times daily. 05/11/16  Yes Rudene Re, MD  lisinopril (PRINIVIL,ZESTRIL) 20 MG tablet Take 1 tablet (20 mg total) by mouth daily. 05/11/16 05/11/17 Yes Rudene Re, MD  metFORMIN (GLUCOPHAGE) 500 MG tablet Take 2 tablets (1,000 mg total) by mouth 2 (two) times daily with a meal. 05/11/16  Yes Rudene Re, MD  cephALEXin (KEFLEX) 500 MG capsule Take 1 capsule (500 mg total) by mouth 3 (three) times daily. 07/16/16   Waynetta Pean, PA-C  fluconazole (DIFLUCAN) 150 MG tablet Take 1 tablet (150 mg total) by mouth once. 07/16/16 07/16/16  Waynetta Pean, PA-C  HYDROcodone-acetaminophen (NORCO/VICODIN) 5-325 MG tablet Take 1 tablet by mouth every 4 (four) hours as needed for severe pain. Patient not taking: Reported  on 03/07/2016 12/06/15   Olivia Canter Sam, PA-C  ibuprofen (ADVIL,MOTRIN) 600 MG tablet Take 1 tablet (600 mg total) by mouth every 6 (six) hours as needed. Patient not taking: Reported on 03/07/2016 12/06/15   Olivia Canter Sam, PA-C  insulin aspart protamine- aspart (NOVOLOG MIX 70/30) (70-30) 100 UNIT/ML injection Inject 0.1 mLs (10 Units total) into the skin 2 (two) times daily with a meal. Reported on 04/01/2015 Patient not taking: Reported on 03/07/2016 06/17/15   Domenic Moras, PA-C  methocarbamol (ROBAXIN)  500 MG tablet Take 1 tablet (500 mg total) by mouth 2 (two) times daily. Patient not taking: Reported on 03/07/2016 12/06/15   Olivia Canter Sam, PA-C  metroNIDAZOLE (FLAGYL) 500 MG tablet Take 1 tablet (500 mg total) by mouth 2 (two) times daily. 07/16/16   Waynetta Pean, PA-C  nitroGLYCERIN (NITROSTAT) 0.4 MG SL tablet Place 1 tablet (0.4 mg total) under the tongue every 5 (five) minutes as needed for chest pain. 05/11/16 05/11/17  Rudene Re, MD  ondansetron (ZOFRAN) 4 MG tablet Take 1 tablet (4 mg total) by mouth every 8 (eight) hours as needed for nausea or vomiting. Patient not taking: Reported on 06/28/2016 05/11/16   Rudene Re, MD    Family History Family History  Problem Relation Age of Onset  . Cancer Father     Social History Social History  Substance Use Topics  . Smoking status: Current Every Day Smoker    Packs/day: 0.50    Years: 30.00    Types: Cigarettes  . Smokeless tobacco: Never Used  . Alcohol use No     Allergies   Aspirin; Bee venom; Other; Naproxen; Toradol [ketorolac tromethamine]; Penicillins; Tramadol; and Vancomycin   Review of Systems Review of Systems  Constitutional: Negative for chills and fever.  HENT: Negative for congestion and sore throat.   Eyes: Negative for visual disturbance.  Respiratory: Negative for cough and shortness of breath.   Cardiovascular: Negative for chest pain.  Gastrointestinal: Positive for abdominal pain. Negative for diarrhea, nausea and vomiting.  Genitourinary: Positive for dysuria, frequency, urgency and vaginal discharge. Negative for decreased urine volume, difficulty urinating, flank pain, hematuria, pelvic pain and vaginal bleeding.  Musculoskeletal: Negative for back pain and neck pain.  Skin: Negative for rash.  Neurological: Negative for headaches.     Physical Exam Updated Vital Signs BP 126/86   Pulse 66   Temp 98.3 F (36.8 C)   Resp 16   Wt 94.8 kg   SpO2 99%   BMI 38.23 kg/m    Physical Exam  Constitutional: She appears well-developed and well-nourished. No distress.  Nontoxic appearing.  HENT:  Head: Normocephalic and atraumatic.  Mouth/Throat: Oropharynx is clear and moist.  Eyes: Conjunctivae are normal. Pupils are equal, round, and reactive to light. Right eye exhibits no discharge. Left eye exhibits no discharge.  Neck: Neck supple.  Cardiovascular: Normal rate, regular rhythm, normal heart sounds and intact distal pulses.   Pulmonary/Chest: Effort normal and breath sounds normal. No respiratory distress. She has no wheezes. She has no rales.  Abdominal: Soft. Bowel sounds are normal. She exhibits no distension and no mass. There is tenderness. There is no guarding.  Abdomen is soft and bowel sounds are present. Patient has mild suprapubic abdominal tenderness to palpation. No peritoneal signs.  Genitourinary:  Genitourinary Comments: Pelvic exam with female RN chaperone. Copious amounts of vaginal discharge with malodor. Cervix is surgically absent.  Musculoskeletal: She exhibits no edema.  Lymphadenopathy:    She has no  cervical adenopathy.  Neurological: She is alert. Coordination normal.  Skin: Skin is warm and dry. Capillary refill takes less than 2 seconds. No rash noted. She is not diaphoretic. No erythema. No pallor.  Psychiatric: She has a normal mood and affect. Her behavior is normal.  Nursing note and vitals reviewed.    ED Treatments / Results  Labs (all labs ordered are listed, but only abnormal results are displayed) Labs Reviewed  WET PREP, GENITAL - Abnormal; Notable for the following:       Result Value   Trich, Wet Prep PRESENT (*)    Clue Cells Wet Prep HPF POC PRESENT (*)    WBC, Wet Prep HPF POC MANY (*)    All other components within normal limits  BASIC METABOLIC PANEL - Abnormal; Notable for the following:    Glucose, Bld 180 (*)    All other components within normal limits  URINALYSIS, ROUTINE W REFLEX MICROSCOPIC -  Abnormal; Notable for the following:    APPearance HAZY (*)    Glucose, UA 150 (*)    Hgb urine dipstick SMALL (*)    Leukocytes, UA LARGE (*)    Bacteria, UA MANY (*)    Squamous Epithelial / LPF 0-5 (*)    All other components within normal limits  URINE CULTURE  CBC WITH DIFFERENTIAL/PLATELET  GC/CHLAMYDIA PROBE AMP ([place]) NOT AT [facility]    EKG  EKG Interpretation None       Radiology No results found.  Procedures Procedures (including critical care time)  Medications Ordered in ED Medications  azithromycin (ZITHROMAX) tablet 1,000 mg (not administered)  cefTRIAXone (ROCEPHIN) injection 250 mg (not administered)  metroNIDAZOLE (FLAGYL) tablet 2,000 mg (not administered)     Initial Impression / Assessment and Plan / ED Course  I have reviewed the triage vital signs and the nursing notes.  Pertinent labs & imaging results that were available during my care of the patient were reviewed by me and considered in my medical decision making (see chart for details).    This is a 51 y.o. Female with a history of DM who presents to the ED complaining of dysuria and vaginal discharge for the past 3 days. Patient tells me she thinks she has a yeast infection and a urinary tract infection. She reports whitish vaginal discharge with itching. She also reports dysuria, urinary frequency and urgency. She reports some mild suprapubic pain today. She denies nausea, vomiting or diarrhea.  On exam patient is afebrile nontoxic appearing. On pelvic exam she has copious amounts of vaginal discharge with malodor. Urinalysis shows large leukocytes with many bacteria. Urine sent for culture. The prep shows Trichomonas, clue cells and white blood cells. Will provide the patient with Rocephin, azithromycin and 2 g of Flagyl may emergency department. We'll discharge with Keflex for UTI and Flagyl for Trichomonas at home. I encouraged her to follow-up with her OB/GYN as well as the health  department. I discussed safe sex practices. I advised the patient to follow-up with their primary care provider this week. I advised the patient to return to the emergency department with new or worsening symptoms or new concerns. The patient verbalized understanding and agreement with plan.      Final Clinical Impressions(s) / ED Diagnoses   Final diagnoses:  Vaginal discharge  Trichimoniasis  BV (bacterial vaginosis)  Concern about STD in female without diagnosis  Acute cystitis with hematuria    New Prescriptions New Prescriptions   CEPHALEXIN (KEFLEX) 500 MG CAPSULE  Take 1 capsule (500 mg total) by mouth 3 (three) times daily.   FLUCONAZOLE (DIFLUCAN) 150 MG TABLET    Take 1 tablet (150 mg total) by mouth once.   METRONIDAZOLE (FLAGYL) 500 MG TABLET    Take 1 tablet (500 mg total) by mouth 2 (two) times daily.     Waynetta Pean, PA-C 07/16/16 1631    Duffy Bruce, MD 07/17/16 [phone]

## 2016-07-17 LAB — GC/CHLAMYDIA PROBE AMP (~~LOC~~) NOT AT ARMC
CHLAMYDIA, DNA PROBE: NEGATIVE
Neisseria Gonorrhea: NEGATIVE

## 2016-07-18 LAB — URINE CULTURE: Culture: NO GROWTH

## 2016-08-12 LAB — GLUCOSE, POCT (MANUAL RESULT ENTRY): POC GLUCOSE: 141 mg/dL — AB (ref 70–99)

## 2016-08-14 ENCOUNTER — Emergency Department (HOSPITAL_COMMUNITY)
Admission: EM | Admit: 2016-08-14 | Discharge: 2016-08-14 | Disposition: A | Discharge status: Home / Self Care | Payer: Medicaid Other | Attending: Emergency Medicine | Admitting: Emergency Medicine

## 2016-08-14 ENCOUNTER — Encounter (HOSPITAL_COMMUNITY): Payer: Self-pay | Admitting: Emergency Medicine

## 2016-08-14 ENCOUNTER — Emergency Department (HOSPITAL_COMMUNITY): Payer: Medicaid Other

## 2016-08-14 DIAGNOSIS — M25561 Pain in right knee: Secondary | ICD-10-CM | POA: Diagnosis not present

## 2016-08-14 DIAGNOSIS — I2510 Atherosclerotic heart disease of native coronary artery without angina pectoris: Secondary | ICD-10-CM | POA: Insufficient documentation

## 2016-08-14 DIAGNOSIS — J449 Chronic obstructive pulmonary disease, unspecified: Secondary | ICD-10-CM | POA: Insufficient documentation

## 2016-08-14 DIAGNOSIS — E119 Type 2 diabetes mellitus without complications: Secondary | ICD-10-CM | POA: Diagnosis not present

## 2016-08-14 DIAGNOSIS — Z7901 Long term (current) use of anticoagulants: Secondary | ICD-10-CM | POA: Insufficient documentation

## 2016-08-14 DIAGNOSIS — Z794 Long term (current) use of insulin: Secondary | ICD-10-CM | POA: Insufficient documentation

## 2016-08-14 DIAGNOSIS — I10 Essential (primary) hypertension: Secondary | ICD-10-CM | POA: Insufficient documentation

## 2016-08-14 DIAGNOSIS — F17210 Nicotine dependence, cigarettes, uncomplicated: Secondary | ICD-10-CM | POA: Insufficient documentation

## 2016-08-14 MED ORDER — ACETAMINOPHEN 325 MG PO TABS
650.0000 mg | ORAL_TABLET | Freq: Once | ORAL | Status: AC
  Administered 2016-08-14: 650 mg via ORAL
  Filled 2016-08-14: qty 2

## 2016-08-14 NOTE — ED Triage Notes (Signed)
Patient complaining of knee pain. Patient was picked up in a park per EMS. She states she walks 10 miles a day and she is homeless. Patient has had this knee pain for over 2 years but today it is worse.

## 2016-08-14 NOTE — ED Notes (Signed)
Ortho tech called and en route.

## 2016-08-14 NOTE — Discharge Instructions (Signed)
As discussed, please follow up with orthopedics. Follow today [facility] protocol instructions as written in the summary. Tylenol or ibuprofen for pain.  Return if you experience worsening or new concerning symptoms in the meantime.

## 2016-08-14 NOTE — ED Provider Notes (Signed)
[place] DEPT Provider Note   CSN: [id number] Arrival date & time: 08/14/16  1914   By signing my name below, I, Neta Mends, attest that this documentation has been prepared under the direction and in the presence of Avie Echevaria, [place]. Electronically Signed: Neta Mends, ED Scribe. 08/14/2016. 7:47 PM.   History   Chief Complaint Chief Complaint  Patient presents with  . Knee Pain   The history is provided by the patient. No language interpreter was used.   HPI Comments:  Carol Hughes is a 51 y.o. female who presents to the Emergency Department complaining of ongoing right knee pain x 2 years. She reports that she can feel and hear her right knee pop when extending her right leg. She states that the pain worsened when she was sitting at the park today, and she was then unable to walk on her right leg. Pt complains of associated right knee swelling and numbness in her right hip, calf, and foot. Pt has been see by her PCP for this, but was unable to get a referral to orthopaedics. She is homeless and states that she spends most of the day walking. No recent fall or injury/trauma. No alleviating factors noted. Pt denies other associated symptoms.   Past Medical History:  Diagnosis Date  . Addiction to drug ([place])   . Cancer (HCC) throat  . COPD (chronic obstructive pulmonary disease) ([place])   . Coronary artery disease   . Diabetes mellitus without complication ([place])   . Hypertension   . Migraine   . Obesity   . Schizophrenia ([place])   . Seizures ([place])   . Sleep apnea   . Throat cancer [facility])     Patient Active Problem List   Diagnosis Date Noted  . Mastitis in female 07/05/2012  . Dehydration 07/05/2012  . Seizure disorder ([place]) 07/05/2012  . Chest pain 05/04/2012  . COPD (chronic obstructive pulmonary disease) ([place]) 05/04/2012  . Diabetes mellitus, type 2 ([place]) 05/04/2012  . Bipolar disorder ([place]) 05/04/2012    Past Surgical History:  Procedure  Laterality Date  . ABDOMINAL HYSTERECTOMY    . APPENDECTOMY    . CESAREAN SECTION    . CHOLECYSTECTOMY    . EYE SURGERY    . KNEE SURGERY      OB History    No data available       Home Medications    Prior to Admission medications   Medication Sig Start Date End Date Taking? Authorizing Provider  albuterol (PROVENTIL HFA;VENTOLIN HFA) 108 (90 Base) MCG/ACT inhaler Inhale 2 puffs into the lungs every 6 (six) hours as needed for wheezing or shortness of breath. 05/11/16   Alfred Levins, [place], MD  cephALEXin (KEFLEX) 500 MG capsule Take 1 capsule (500 mg total) by mouth 3 (three) times daily. 07/16/16   Waynetta Pean, PA-C  divalproex (DEPAKOTE ER) 500 MG 24 hr tablet Take 1 tablet (500 mg total) by mouth 2 (two) times daily. 05/11/16   Rudene Re, MD  HYDROcodone-acetaminophen (NORCO/VICODIN) 5-325 MG tablet Take 1 tablet by mouth every 4 (four) hours as needed for severe pain. Patient not taking: Reported on 03/07/2016 12/06/15   Sam, Olivia Canter, PA-C  ibuprofen (ADVIL,MOTRIN) 600 MG tablet Take 1 tablet (600 mg total) by mouth every 6 (six) hours as needed. Patient not taking: Reported on 03/07/2016 12/06/15   Sam, Olivia Canter, PA-C  insulin aspart protamine- aspart (NOVOLOG MIX 70/30) (70-30) 100 UNIT/ML injection Inject 0.1 mLs (10 Units total) into the  skin 2 (two) times daily with a meal. Reported on 04/01/2015 Patient not taking: Reported on 03/07/2016 06/17/15   Domenic Moras, PA-C  lisinopril (PRINIVIL,ZESTRIL) 20 MG tablet Take 1 tablet (20 mg total) by mouth daily. 05/11/16 05/11/17  Rudene Re, MD  metFORMIN (GLUCOPHAGE) 500 MG tablet Take 2 tablets (1,000 mg total) by mouth 2 (two) times daily with a meal. 05/11/16   Alfred Levins, [place], MD  methocarbamol (ROBAXIN) 500 MG tablet Take 1 tablet (500 mg total) by mouth 2 (two) times daily. Patient not taking: Reported on 03/07/2016 12/06/15   Sam, Olivia Canter, PA-C  metroNIDAZOLE (FLAGYL) 500 MG tablet Take 1 tablet (500 mg total)  by mouth 2 (two) times daily. 07/16/16   Waynetta Pean, PA-C  nitroGLYCERIN (NITROSTAT) 0.4 MG SL tablet Place 1 tablet (0.4 mg total) under the tongue every 5 (five) minutes as needed for chest pain. 05/11/16 05/11/17  Rudene Re, MD  ondansetron (ZOFRAN) 4 MG tablet Take 1 tablet (4 mg total) by mouth every 8 (eight) hours as needed for nausea or vomiting. Patient not taking: Reported on 06/28/2016 05/11/16   Rudene Re, MD    Family History Family History  Problem Relation Age of Onset  . Cancer Father     Social History Social History  Substance Use Topics  . Smoking status: Current Every Day Smoker    Packs/day: 0.50    Years: 30.00    Types: Cigarettes  . Smokeless tobacco: Never Used  . Alcohol use No     Allergies   Aspirin; Bee venom; Other; Naproxen; Toradol [ketorolac tromethamine]; Penicillins; Tramadol; and Vancomycin   Review of Systems Review of Systems  Constitutional: Negative for chills and fever.  Respiratory: Negative for cough, shortness of breath, wheezing and stridor.   Cardiovascular: Negative for chest pain, palpitations and leg swelling.  Gastrointestinal: Negative for nausea and vomiting.  Musculoskeletal: Positive for arthralgias, gait problem, joint swelling and myalgias. Negative for back pain, neck pain and neck stiffness.  Skin: Negative for color change, pallor, rash and wound.  Neurological: Positive for numbness. Negative for seizures and syncope.     Physical Exam Updated Vital Signs BP (!) 123/98 (BP Location: Left Arm)   Pulse 68   Temp 98.5 F (36.9 C) (Oral)   Resp 20   SpO2 99%   Physical Exam  Constitutional: She appears well-developed and well-nourished. No distress.   Patient is afebrile, non-toxic appearing, seating comfortably in chair in no acute distress.   HENT:  Head: Normocephalic and atraumatic.  Eyes: Conjunctivae and EOM are normal.  Neck: Normal range of motion.  Cardiovascular: Normal rate,  regular rhythm, normal heart sounds and intact distal pulses.   Pulmonary/Chest: Effort normal. No respiratory distress. She has no wheezes. She has no rales.  Abdominal: She exhibits no distension.  Musculoskeletal: She exhibits tenderness. She exhibits no edema or deformity.  Decreased range of motion of the right knee patient is exhibiting poor effort flexion and extension plantar flexion and dorsiflexion due to pain. Diffuse tenderness to palpation of entire right knee. No erythema, swelling, warmth. Negative anterior drawer.  Neurological: She is alert. A sensory deficit is present. She exhibits normal muscle tone.  Skin: Skin is warm and dry. Capillary refill takes less than 2 seconds. No rash noted. She is not diaphoretic. No erythema. No pallor.  Nursing note and vitals reviewed.    ED Treatments / Results  DIAGNOSTIC STUDIES:  Oxygen Saturation is 99% on RA, normal by my interpretation.  COORDINATION OF CARE:  7:45 PM Discussed treatment plan with pt at bedside and pt agreed to plan.   Labs (all labs ordered are listed, but only abnormal results are displayed) Labs Reviewed - No data to display  EKG  EKG Interpretation None       Radiology Dg Knee Complete 4 Views Right  Result Date: 08/14/2016 CLINICAL DATA:  Chronic anterior right knee pain that has acutely worsened in the last 24 hours. EXAM: RIGHT KNEE - COMPLETE 4+ VIEW COMPARISON:  Right knee radiograph 11/29/2015 FINDINGS: There is marginal osteophytosis of the lateral femorotibial compartment of the right knee. There is a chronic posttraumatic deformity of the proximal right fibula. No knee effusion. No fracture or dislocation. The bones are osteopenic. IMPRESSION: Mild lateral compartment predominant osteoarthrosis, unchanged compared to 11/29/2015. No acute abnormality. Electronically Signed   By: Ulyses Jarred M.D.   On: 08/14/2016 20:28    Procedures Procedures (including critical care time) SPLINT  APPLICATION Date/Time: 4:09 PM Authorized by: Emeline General Consent: Verbal consent obtained. Risks and benefits: risks, benefits and alternatives were discussed Consent given by: patient Splint applied by: orthopedic technician Location details: right knee Splint type: knee immobilizer Post-procedure: The splinted body part was neurovascularly unchanged following the procedure. Patient tolerance: Patient tolerated the procedure well with no immediate complications.    Medications Ordered in ED Medications  acetaminophen (TYLENOL) tablet 650 mg (650 mg Oral Given 08/14/16 2001)     Initial Impression / Assessment and Plan / ED Course  I have reviewed the triage vital signs and the nursing notes.  Pertinent labs & imaging results that were available during my care of the patient were reviewed by me and considered in my medical decision making (see chart for details).        Patient presents with acute worsening of chronic right knee pain with associated reported numbness. She states that she cannot bear weight. She appeared comfortable before I went in to see her and while obtaining history, she began crying in pain. On exam patient is exhibiting poor effort. 4 out of 5 strength plantar flexion dorsiflexion on the right. Reports decreased sensation to light touch bilaterally R>L. Strong dorsalis pedis pulses bilaterally, good color. Knee is stable and negative anterior drawer. Her pain appears out of proportion to injury. What she describes appears consistent with a potential meniscal tear and I recommended that she sees ortho for further evaluation.  Ordered plain films and pain management. Patient was immediately inquisitive about which pain medication was to be used and reported being allergic to naproxen, Toradol, aspirin. Her reaction to Toradol is itching. Offered to give patient Benadryl with Toradol which she refused. Patient was given Tylenol and ice.  I have reviewed  records with prior ED visits with similar or other pain related complaints, usually with negative workups. I feel that the patient's pain is chronic and cannot be appropriately or safely treated in an emergency department setting.   Knee brace and crutches provided and rice protocol indicated with close follow up with ortho for further evaluation. Patient was sitting comfortably on reassessment and coloring. Requested bus passes which were provided by nurse. Patient was stable prior to discharge.  I do not feel that providing narcotic pain medication is in this patient's best interest. I have urged the patient to have close follow up with their provider or pain specialist and have provided the adequate resources for this. I have explicitly discussed with the patient return precautions and have  reassured patient that they can always be seen and evaluated in the emergency department for any condition that they feel is emergent, and that they will be given treatment as the EDP feels is appropriate and safe, but this may not involve the use of narcotic pain medications. The patient was given the opportunity to voice any further questions or concerns and these were addressed to the best of my ability.   Discussed strict return precautions and advised to return to the emergency department if experiencing any new or worsening symptoms. Instructions were understood and patient agreed with discharge plan. Final Clinical Impressions(s) / ED Diagnoses   Final diagnoses:  Acute pain of right knee    New Prescriptions New Prescriptions   No medications on file  I personally performed the services described in this documentation, which was scribed in my presence. The recorded information has been reviewed and is accurate.     Emeline General, PA-C 08/14/16 2053    Emeline General, PA-C 08/14/16 2054    Orlie Dakin, MD 08/15/16 Shelah Lewandowsky

## 2016-08-14 NOTE — ED Notes (Signed)
Pt states "this is just candy" in reference to the Tylenol administration. She also states, "I know this wont help me."

## 2016-08-14 NOTE — ED Notes (Signed)
Pt reports that she has BP medicine to take.

## 2016-09-08 ENCOUNTER — Encounter (HOSPITAL_COMMUNITY): Payer: Self-pay | Admitting: *Deleted

## 2016-09-08 ENCOUNTER — Emergency Department (HOSPITAL_COMMUNITY): Payer: Medicaid Other

## 2016-09-08 ENCOUNTER — Emergency Department (HOSPITAL_COMMUNITY)
Admission: EM | Admit: 2016-09-08 | Discharge: 2016-09-08 | Disposition: A | Discharge status: Home / Self Care | Payer: Medicaid Other | Attending: Emergency Medicine | Admitting: Emergency Medicine

## 2016-09-08 DIAGNOSIS — Z79899 Other long term (current) drug therapy: Secondary | ICD-10-CM | POA: Insufficient documentation

## 2016-09-08 DIAGNOSIS — I10 Essential (primary) hypertension: Secondary | ICD-10-CM | POA: Insufficient documentation

## 2016-09-08 DIAGNOSIS — F17210 Nicotine dependence, cigarettes, uncomplicated: Secondary | ICD-10-CM | POA: Insufficient documentation

## 2016-09-08 DIAGNOSIS — A599 Trichomoniasis, unspecified: Secondary | ICD-10-CM | POA: Diagnosis not present

## 2016-09-08 DIAGNOSIS — E119 Type 2 diabetes mellitus without complications: Secondary | ICD-10-CM | POA: Insufficient documentation

## 2016-09-08 DIAGNOSIS — I2510 Atherosclerotic heart disease of native coronary artery without angina pectoris: Secondary | ICD-10-CM | POA: Insufficient documentation

## 2016-09-08 DIAGNOSIS — Z794 Long term (current) use of insulin: Secondary | ICD-10-CM | POA: Diagnosis not present

## 2016-09-08 DIAGNOSIS — J449 Chronic obstructive pulmonary disease, unspecified: Secondary | ICD-10-CM | POA: Diagnosis not present

## 2016-09-08 DIAGNOSIS — R102 Pelvic and perineal pain: Secondary | ICD-10-CM | POA: Diagnosis not present

## 2016-09-08 DIAGNOSIS — Z202 Contact with and (suspected) exposure to infections with a predominantly sexual mode of transmission: Secondary | ICD-10-CM | POA: Insufficient documentation

## 2016-09-08 DIAGNOSIS — N898 Other specified noninflammatory disorders of vagina: Secondary | ICD-10-CM | POA: Diagnosis present

## 2016-09-08 DIAGNOSIS — Z85818 Personal history of malignant neoplasm of other sites of lip, oral cavity, and pharynx: Secondary | ICD-10-CM | POA: Diagnosis not present

## 2016-09-08 LAB — CBC WITH DIFFERENTIAL/PLATELET
Basophils Absolute: 0 10*3/uL (ref 0.0–0.1)
Basophils Relative: 0 %
Eosinophils Absolute: 0.1 10*3/uL (ref 0.0–0.7)
Eosinophils Relative: 1 %
HEMATOCRIT: 43.1 % (ref 36.0–46.0)
Hemoglobin: 14.4 g/dL (ref 12.0–15.0)
LYMPHS ABS: 3 10*3/uL (ref 0.7–4.0)
Lymphocytes Relative: 35 %
MCH: 30.6 pg (ref 26.0–34.0)
MCHC: 33.4 g/dL (ref 30.0–36.0)
MCV: 91.5 fL (ref 78.0–100.0)
MONO ABS: 0.7 10*3/uL (ref 0.1–1.0)
MONOS PCT: 8 %
NEUTROS ABS: 4.8 10*3/uL (ref 1.7–7.7)
Neutrophils Relative %: 56 %
PLATELETS: 202 10*3/uL (ref 150–400)
RBC: 4.71 MIL/uL (ref 3.87–5.11)
RDW: 12.8 % (ref 11.5–15.5)
WBC: 8.6 10*3/uL (ref 4.0–10.5)

## 2016-09-08 LAB — BASIC METABOLIC PANEL
Anion gap: 8 (ref 5–15)
BUN: 9 mg/dL (ref 6–20)
CHLORIDE: 103 mmol/L (ref 101–111)
CO2: 25 mmol/L (ref 22–32)
Calcium: 9.2 mg/dL (ref 8.9–10.3)
Creatinine, Ser: 0.79 mg/dL (ref 0.44–1.00)
GFR calc Af Amer: >60 mL/min (ref >60)
GLUCOSE: 161 mg/dL — AB (ref 65–99)
POTASSIUM: 3.9 mmol/L (ref 3.5–5.1)
Sodium: 136 mmol/L (ref 135–145)

## 2016-09-08 LAB — URINALYSIS, ROUTINE W REFLEX MICROSCOPIC
BILIRUBIN URINE: NEGATIVE
Glucose, UA: NEGATIVE mg/dL
Ketones, ur: NEGATIVE mg/dL
Nitrite: NEGATIVE
PH: 5 (ref 5.0–8.0)
Protein, ur: NEGATIVE mg/dL
SPECIFIC GRAVITY, URINE: 1.02 (ref 1.005–1.030)

## 2016-09-08 LAB — WET PREP, GENITAL
CLUE CELLS WET PREP: NONE SEEN
SPERM: NONE SEEN
YEAST WET PREP: NONE SEEN

## 2016-09-08 MED ORDER — METRONIDAZOLE 500 MG PO TABS
2000.0000 mg | ORAL_TABLET | Freq: Once | ORAL | Status: AC
  Administered 2016-09-08: 2000 mg via ORAL
  Filled 2016-09-08: qty 4

## 2016-09-08 MED ORDER — DOXYCYCLINE HYCLATE 100 MG PO CAPS: 100.0000 mg | ORAL_CAPSULE | Freq: Two times a day (BID) | ORAL | 0 refills | Status: AC

## 2016-09-08 MED ORDER — LIDOCAINE HCL (PF) 1 % IJ SOLN
INTRAMUSCULAR | Status: AC
  Filled 2016-09-08: qty 5

## 2016-09-08 MED ORDER — AZITHROMYCIN 250 MG PO TABS
1000.0000 mg | ORAL_TABLET | Freq: Once | ORAL | Status: AC
  Administered 2016-09-08: 1000 mg via ORAL
  Filled 2016-09-08: qty 4

## 2016-09-08 MED ORDER — CEFTRIAXONE SODIUM 250 MG IJ SOLR
250.0000 mg | Freq: Once | INTRAMUSCULAR | Status: AC
  Administered 2016-09-08: 250 mg via INTRAMUSCULAR
  Filled 2016-09-08: qty 250

## 2016-09-08 MED ORDER — IOPAMIDOL (ISOVUE-300) INJECTION 61%
INTRAVENOUS | Status: AC
  Administered 2016-09-08: 100 mL
  Filled 2016-09-08: qty 100

## 2016-09-08 NOTE — ED Triage Notes (Signed)
To ED for eval of vaginal discharge for past 2 days. Pt states she noticed 2 days ago and is same symptoms as last time she was treated for STD. No abd pain

## 2016-09-08 NOTE — ED Notes (Addendum)
PT going to CT

## 2016-09-08 NOTE — Discharge Instructions (Signed)
Your imaging and lab work is reassuring. Please take the antibiotic as prescribed for 14 days. Make sure you follow-up with an OB/GYN doctor. Avoid sexual contact for 14 days. Make sure from all sexual partners treated for an STD.

## 2016-09-08 NOTE — ED Provider Notes (Signed)
[place] DEPT Provider Note   CSN: [id number] Arrival date & time: 09/08/16  0751     History   Chief Complaint Chief Complaint  Patient presents with  . Exposure to STD    HPI Carol Hughes is a 51 y.o. female.  HPI 51 year old Caucasian female past medical history significant for diabetes, hypertension, obesity that presents to the emergency Department today with complaints of vaginal discharge. The patient states that she is sexually active with one female partner. Does not use protection. Over the past 2 days she has noticed green foul-smelling discharge. Patient states that this happened a few months ago and was treated in the ED for same for Trichomonas at that time. States that her significant other has not been tested or treated for any STDs. States that she does have pelvic pain with intercourse. Denies any abdominal pain. Patient denies any urinary symptoms, vaginal bleeding, nausea, vomiting, fever, chills. She does report mild itching in the vaginal area. She does not want any hiv and rpr testing. Patient reports normal bowel movements. No melena or hematochezia. Past Medical History:  Diagnosis Date  . Addiction to drug ([place])   . Cancer (HCC) throat  . COPD (chronic obstructive pulmonary disease) ([place])   . Coronary artery disease   . Diabetes mellitus without complication ([place])   . Hypertension   . Migraine   . Obesity   . Schizophrenia ([place])   . Seizures ([place])   . Sleep apnea   . Throat cancer [facility])     Patient Active Problem List   Diagnosis Date Noted  . Mastitis in female 07/05/2012  . Dehydration 07/05/2012  . Seizure disorder ([place]) 07/05/2012  . Chest pain 05/04/2012  . COPD (chronic obstructive pulmonary disease) ([place]) 05/04/2012  . Diabetes mellitus, type 2 ([place]) 05/04/2012  . Bipolar disorder ([place]) 05/04/2012    Past Surgical History:  Procedure Laterality Date  . ABDOMINAL HYSTERECTOMY    . APPENDECTOMY    . CESAREAN SECTION    .  CHOLECYSTECTOMY    . EYE SURGERY    . KNEE SURGERY      OB History    No data available       Home Medications    Prior to Admission medications   Medication Sig Start Date End Date Taking? Authorizing Provider  albuterol (PROVENTIL HFA;VENTOLIN HFA) 108 (90 Base) MCG/ACT inhaler Inhale 2 puffs into the lungs every 6 (six) hours as needed for wheezing or shortness of breath. 05/11/16  Yes Alfred Levins, [place], MD  nitroGLYCERIN (NITROSTAT) 0.4 MG SL tablet Place 1 tablet (0.4 mg total) under the tongue every 5 (five) minutes as needed for chest pain. 05/11/16 05/11/17 Yes Veronese, [place], MD  cephALEXin (KEFLEX) 500 MG capsule Take 1 capsule (500 mg total) by mouth 3 (three) times daily. Patient not taking: Reported on 09/08/2016 07/16/16   Waynetta Pean, PA-C  divalproex (DEPAKOTE ER) 500 MG 24 hr tablet Take 1 tablet (500 mg total) by mouth 2 (two) times daily. Patient not taking: Reported on 09/08/2016 05/11/16   Rudene Re, MD  doxycycline (VIBRAMYCIN) 100 MG capsule Take 1 capsule (100 mg total) by mouth 2 (two) times daily. 09/08/16   Doristine Devoid, PA-C  HYDROcodone-acetaminophen (NORCO/VICODIN) 5-325 MG tablet Take 1 tablet by mouth every 4 (four) hours as needed for severe pain. Patient not taking: Reported on 03/07/2016 12/06/15   Sam, Olivia Canter, PA-C  ibuprofen (ADVIL,MOTRIN) 600 MG tablet Take 1 tablet (600 mg total) by mouth every 6 (six)  hours as needed. Patient not taking: Reported on 09/08/2016 12/06/15   Sam, Olivia Canter, PA-C  insulin aspart protamine- aspart (NOVOLOG MIX 70/30) (70-30) 100 UNIT/ML injection Inject 0.1 mLs (10 Units total) into the skin 2 (two) times daily with a meal. Reported on 04/01/2015 Patient not taking: Reported on 03/07/2016 06/17/15   Domenic Moras, PA-C  lisinopril (PRINIVIL,ZESTRIL) 20 MG tablet Take 1 tablet (20 mg total) by mouth daily. Patient not taking: Reported on 09/08/2016 05/11/16 05/11/17  Rudene Re, MD  metFORMIN (GLUCOPHAGE)  500 MG tablet Take 2 tablets (1,000 mg total) by mouth 2 (two) times daily with a meal. Patient not taking: Reported on 09/08/2016 05/11/16   Rudene Re, MD  methocarbamol (ROBAXIN) 500 MG tablet Take 1 tablet (500 mg total) by mouth 2 (two) times daily. Patient not taking: Reported on 03/07/2016 12/06/15   Sam, Olivia Canter, PA-C  metroNIDAZOLE (FLAGYL) 500 MG tablet Take 1 tablet (500 mg total) by mouth 2 (two) times daily. Patient not taking: Reported on 09/08/2016 07/16/16   Waynetta Pean, PA-C  ondansetron (ZOFRAN) 4 MG tablet Take 1 tablet (4 mg total) by mouth every 8 (eight) hours as needed for nausea or vomiting. Patient not taking: Reported on 09/08/2016 05/11/16   Rudene Re, MD    Family History Family History  Problem Relation Age of Onset  . Cancer Father     Social History Social History  Substance Use Topics  . Smoking status: Current Every Day Smoker    Packs/day: 0.50    Years: 30.00    Types: Cigarettes  . Smokeless tobacco: Never Used  . Alcohol use No     Allergies   Aspirin; Bee venom; Other; Naproxen; Toradol [ketorolac tromethamine]; Penicillins; Tramadol; and Vancomycin   Review of Systems Review of Systems  Constitutional: Negative for chills and fever.  HENT: Negative for congestion.   Eyes: Negative for visual disturbance.  Respiratory: Negative for cough and shortness of breath.   Cardiovascular: Negative for chest pain.  Gastrointestinal: Negative for abdominal pain, diarrhea, nausea and vomiting.  Genitourinary: Positive for dyspareunia and vaginal discharge. Negative for dysuria, flank pain, frequency, hematuria, pelvic pain, urgency and vaginal bleeding.  Musculoskeletal: Negative for arthralgias and myalgias.  Skin: Negative for rash.  Neurological: Negative for dizziness, syncope, weakness, light-headedness, numbness and headaches.  Psychiatric/Behavioral: Negative for sleep disturbance. The patient is not nervous/anxious.       Physical Exam Updated Vital Signs BP 128/69 (BP Location: Right Arm)   Pulse 65   Temp 98.3 F (36.8 C) (Oral)   Resp 16   SpO2 100%   Physical Exam  Constitutional: She is oriented to person, place, and time. She appears well-developed and well-nourished.  Non-toxic appearance. No distress.  HENT:  Head: Normocephalic and atraumatic.  Nose: Nose normal.  Mouth/Throat: Oropharynx is clear and moist.  Eyes: Conjunctivae are normal. Pupils are equal, round, and reactive to light. Right eye exhibits no discharge. Left eye exhibits no discharge.  Neck: Normal range of motion. Neck supple.  Cardiovascular: Normal rate, regular rhythm, normal heart sounds and intact distal pulses.   Pulmonary/Chest: Effort normal and breath sounds normal. No respiratory distress. She exhibits no tenderness.  Abdominal: Soft. Bowel sounds are normal. There is tenderness in the right lower quadrant, suprapubic area and left lower quadrant. There is no rigidity, no rebound, no guarding and no CVA tenderness.  Genitourinary:  Genitourinary Comments: Chaperone present for exam. No external lesions, swelling, erythema, or rash of the labia. No erythema,  bleeding, or lesions noted in the vaginal vault. Green foul-smelling discharge noted. Edema to the vaginal wall. Mild erythema to the vaginal vault. Patient uncomfortable with exam. No inguinal adenopathy or hernia.    Musculoskeletal: Normal range of motion. She exhibits no tenderness.  Lymphadenopathy:    She has no cervical adenopathy.  Neurological: She is alert and oriented to person, place, and time.  Skin: Skin is warm and dry. Capillary refill takes less than 2 seconds.  Psychiatric: Her behavior is normal. Judgment and thought content normal.  Nursing note and vitals reviewed.    ED Treatments / Results  Labs (all labs ordered are listed, but only abnormal results are displayed) Labs Reviewed  WET PREP, GENITAL - Abnormal; Notable for the  following:       Result Value   Trich, Wet Prep PRESENT (*)    WBC, Wet Prep HPF POC MANY (*)    All other components within normal limits  URINALYSIS, ROUTINE W REFLEX MICROSCOPIC - Abnormal; Notable for the following:    APPearance HAZY (*)    Hgb urine dipstick SMALL (*)    Leukocytes, UA LARGE (*)    Bacteria, UA RARE (*)    Squamous Epithelial / LPF 0-5 (*)    All other components within normal limits  BASIC METABOLIC PANEL - Abnormal; Notable for the following:    Glucose, Bld 161 (*)    All other components within normal limits  CBC WITH DIFFERENTIAL/PLATELET  GC/CHLAMYDIA PROBE AMP ([place]) NOT AT [facility]    EKG  EKG Interpretation None       Radiology Ct Abdomen Pelvis W Contrast  Result Date: 09/08/2016 CLINICAL DATA:  Abdominal and pelvic pain.  Dyspareunia. EXAM: CT ABDOMEN AND PELVIS WITH CONTRAST TECHNIQUE: Multidetector CT imaging of the abdomen and pelvis was performed using the standard protocol following bolus administration of intravenous contrast. CONTRAST:  100 mL ISOVUE-300 IOPAMIDOL (ISOVUE-300) INJECTION 61% COMPARISON:  CT abdomen and pelvis September 28, 2013 FINDINGS: Lower chest: There is slight scarring in the anterior left base. Lung bases otherwise are clear. Hepatobiliary: Liver measures 20.6 cm in length. There is hepatic steatosis. No focal liver lesions are apparent. Gallbladder is absent. There is no biliary duct dilatation. Pancreas: No pancreatic mass or inflammatory focus. Spleen: No splenic lesions are evident. Adrenals/Urinary Tract: Adrenals appear normal bilaterally. There are fetal lobulations in each kidney, an anatomic variant. There is no renal mass or hydronephrosis on either side. There is no renal or ureteral calculus on either side. Urinary bladder is midline with wall thickness within normal limits. Stomach/Bowel: There is no appreciable bowel wall or mesenteric thickening. There is no appreciable bowel obstruction. No free air or portal  venous air. Vascular/Lymphatic: There are foci of atherosclerotic calcification in the aorta and common iliac arteries. No aneurysm evident. Major mesenteric vessels appear patent. No adenopathy is evident in the abdomen or pelvis. Reproductive: Uterus absent.  No pelvic mass. Other: Appendix absent. No ascites or abscess evident in the abdomen or pelvis. There is a rather minimal ventral hernia containing only fat. Musculoskeletal: There are no appreciable blastic or lytic bone lesions. There is no intramuscular or abdominal wall lesion. IMPRESSION: Prominent liver with hepatic steatosis. No focal liver lesions evident. No bowel wall thickening or bowel obstruction.  No abscess. Uterus, gallbladder, and appendix absent. No renal or ureteral calculus.  No hydronephrosis. Rather minimal ventral hernia containing only fat. Electronically Signed   By: Lowella Grip III M.D.   On: 09/08/2016  11:04    Procedures Procedures (including critical care time)  Medications Ordered in ED Medications  cefTRIAXone (ROCEPHIN) injection 250 mg (not administered)  azithromycin (ZITHROMAX) tablet 1,000 mg (not administered)  metroNIDAZOLE (FLAGYL) tablet 2,000 mg (not administered)  lidocaine (PF) (XYLOCAINE) 1 % injection (not administered)  iopamidol (ISOVUE-300) 61 % injection (100 mLs  Contrast Given 09/08/16 1044)     Initial Impression / Assessment and Plan / ED Course  I have reviewed the triage vital signs and the nursing notes.  Pertinent labs & imaging results that were available during my care of the patient were reviewed by me and considered in my medical decision making (see chart for details).     Patient presents to the ED with complaints of vaginal discharge for the past 2 days. Patient with history of STD exposure. Sexually active with one female partner who has not been treated. She was recently treated for Trichomonas approximately 2 months ago. States her symptoms have returned. She also  endorses pain with vaginal insertion and dyspareunia. She does have significant edema, erythema and discharge the vaginal vault. She does complain of pain with insertion of the vaginal speculum. Patient history of hysterectomy. Lab work is reassuring. No electrolyte abnormality. No leukocytosis. Trichomonas seen on wet prep. No clue cells. UA showssigns of infection. She does not complain of any urinary symptoms. The patient does have some tenderness to palpation of the lower abdomen. Orders CT contrast of abdomen that showed no acute abnormalities. Will treat patient for STD including azithromycin, Flagyl, Rocephin. We'll discharge patient home on doxycycline to cover for additional infection given her significant tenderness in the vaginal vault with pelvic exam. Encourage patient to follow-up with her OB/GYN doctor.   Pt is hemodynamically stable, in NAD, & able to ambulate in the ED. Evaluation does not show pathology that would require ongoing emergent intervention or inpatient treatment. I explained the diagnosis to the patient. Pain has been managed & has no complaints prior to dc. Pt is comfortable with above plan and is stable for discharge at this time. All questions were answered prior to disposition. Strict return precautions for f/u to the ED were discussed. Encouraged follow up with PCP.   Final Clinical Impressions(s) / ED Diagnoses   Final diagnoses:  STD exposure  Trichomoniasis    New Prescriptions New Prescriptions   DOXYCYCLINE (VIBRAMYCIN) 100 MG CAPSULE    Take 1 capsule (100 mg total) by mouth 2 (two) times daily.     Doristine Devoid, PA-C 09/08/16 [place], MD 09/09/16 [phone]

## 2016-09-09 LAB — GC/CHLAMYDIA PROBE AMP (~~LOC~~) NOT AT ARMC
CHLAMYDIA, DNA PROBE: NEGATIVE
Neisseria Gonorrhea: NEGATIVE

## 2016-10-02 ENCOUNTER — Encounter (HOSPITAL_COMMUNITY): Payer: Self-pay | Admitting: Emergency Medicine

## 2016-10-02 ENCOUNTER — Emergency Department (HOSPITAL_COMMUNITY)
Admission: EM | Admit: 2016-10-02 | Discharge: 2016-10-02 | Disposition: A | Discharge status: Home / Self Care | Payer: Medicaid Other | Attending: Emergency Medicine | Admitting: Emergency Medicine

## 2016-10-02 ENCOUNTER — Emergency Department (HOSPITAL_COMMUNITY): Payer: Medicaid Other

## 2016-10-02 DIAGNOSIS — R1030 Lower abdominal pain, unspecified: Secondary | ICD-10-CM | POA: Diagnosis not present

## 2016-10-02 DIAGNOSIS — N898 Other specified noninflammatory disorders of vagina: Secondary | ICD-10-CM | POA: Diagnosis present

## 2016-10-02 DIAGNOSIS — I2510 Atherosclerotic heart disease of native coronary artery without angina pectoris: Secondary | ICD-10-CM | POA: Diagnosis not present

## 2016-10-02 DIAGNOSIS — J449 Chronic obstructive pulmonary disease, unspecified: Secondary | ICD-10-CM | POA: Insufficient documentation

## 2016-10-02 DIAGNOSIS — I10 Essential (primary) hypertension: Secondary | ICD-10-CM | POA: Diagnosis not present

## 2016-10-02 DIAGNOSIS — A599 Trichomoniasis, unspecified: Secondary | ICD-10-CM | POA: Diagnosis not present

## 2016-10-02 DIAGNOSIS — M542 Cervicalgia: Secondary | ICD-10-CM

## 2016-10-02 DIAGNOSIS — E119 Type 2 diabetes mellitus without complications: Secondary | ICD-10-CM | POA: Diagnosis not present

## 2016-10-02 DIAGNOSIS — F17210 Nicotine dependence, cigarettes, uncomplicated: Secondary | ICD-10-CM | POA: Diagnosis not present

## 2016-10-02 LAB — CBC WITH DIFFERENTIAL/PLATELET
BASOS ABS: 0 10*3/uL (ref 0.0–0.1)
BASOS PCT: 0 %
Eosinophils Absolute: 0.1 10*3/uL (ref 0.0–0.7)
Eosinophils Relative: 1 %
HEMATOCRIT: 41.9 % (ref 36.0–46.0)
Hemoglobin: 14.4 g/dL (ref 12.0–15.0)
Lymphocytes Relative: 31 %
Lymphs Abs: 2.3 10*3/uL (ref 0.7–4.0)
MCH: 30.7 pg (ref 26.0–34.0)
MCHC: 34.4 g/dL (ref 30.0–36.0)
MCV: 89.3 fL (ref 78.0–100.0)
Monocytes Absolute: 0.6 10*3/uL (ref 0.1–1.0)
Monocytes Relative: 8 %
NEUTROS ABS: 4.5 10*3/uL (ref 1.7–7.7)
NEUTROS PCT: 60 %
Platelets: 206 10*3/uL (ref 150–400)
RBC: 4.69 MIL/uL (ref 3.87–5.11)
RDW: 12.9 % (ref 11.5–15.5)
WBC: 7.5 10*3/uL (ref 4.0–10.5)

## 2016-10-02 LAB — BASIC METABOLIC PANEL
Anion gap: 8 (ref 5–15)
BUN: 13 mg/dL (ref 6–20)
CO2: 22 mmol/L (ref 22–32)
Calcium: 9.3 mg/dL (ref 8.9–10.3)
Chloride: 109 mmol/L (ref 101–111)
Creatinine, Ser: 0.69 mg/dL (ref 0.44–1.00)
GFR calc non Af Amer: >60 mL/min (ref >60)
Glucose, Bld: 147 mg/dL — ABNORMAL HIGH (ref 65–99)
POTASSIUM: 3.7 mmol/L (ref 3.5–5.1)
Sodium: 139 mmol/L (ref 135–145)

## 2016-10-02 LAB — WET PREP, GENITAL
Clue Cells Wet Prep HPF POC: NONE SEEN
SPERM: NONE SEEN
Yeast Wet Prep HPF POC: NONE SEEN

## 2016-10-02 LAB — CBG MONITORING, ED: Glucose-Capillary: 140 mg/dL — ABNORMAL HIGH (ref 65–99)

## 2016-10-02 MED ORDER — METRONIDAZOLE 500 MG PO TABS
500.0000 mg | ORAL_TABLET | Freq: Once | ORAL | Status: AC
  Administered 2016-10-02: 500 mg via ORAL
  Filled 2016-10-02: qty 1

## 2016-10-02 MED ORDER — ACETAMINOPHEN 500 MG PO TABS
1000.0000 mg | ORAL_TABLET | Freq: Once | ORAL | Status: DC
  Filled 2016-10-02: qty 2

## 2016-10-02 MED ORDER — METRONIDAZOLE 500 MG PO TABS: 500.0000 mg | ORAL_TABLET | Freq: Two times a day (BID) | ORAL | 0 refills | Status: DC

## 2016-10-02 NOTE — ED Provider Notes (Signed)
[place] DEPT Provider Note   CSN: [id number] Arrival date & time: 10/02/16  1021     History   Chief Complaint Chief Complaint  Patient presents with  . Vaginal Discharge    HPI   Blood pressure 122/71, pulse 61, temperature 98.7 F (37.1 C), temperature source Oral, resp. rate 18, height 5\' 2"  (1.575 m), weight 88.5 kg (195 lb), SpO2 95 %.  Carol Hughes is a 51 y.o. female complaining of profuse foul-smelling greenish vaginal discharge onset yesterday. She had unprotected sex with her boyfriend approximately 5 days ago and she states that he had a STD but didn't take his medication to properly clear. She was seen for vaginal discharge at the end of June started on PID regimen which she states she completed. She endorses mild lower abdominal pain with no fever, chills, nausea, vomiting, dysuria, hematuria.  Past Medical History:  Diagnosis Date  . Addiction to drug ([place])   . Cancer (HCC) throat  . COPD (chronic obstructive pulmonary disease) ([place])   . Coronary artery disease   . Diabetes mellitus without complication ([place])   . Hypertension   . Migraine   . Obesity   . Schizophrenia ([place])   . Seizures ([place])   . Sleep apnea   . Throat cancer [facility])     Patient Active Problem List   Diagnosis Date Noted  . Mastitis in female 07/05/2012  . Dehydration 07/05/2012  . Seizure disorder ([place]) 07/05/2012  . Chest pain 05/04/2012  . COPD (chronic obstructive pulmonary disease) ([place]) 05/04/2012  . Diabetes mellitus, type 2 ([place]) 05/04/2012  . Bipolar disorder ([place]) 05/04/2012    Past Surgical History:  Procedure Laterality Date  . ABDOMINAL HYSTERECTOMY    . APPENDECTOMY    . CESAREAN SECTION    . CHOLECYSTECTOMY    . EYE SURGERY    . KNEE SURGERY      OB History    No data available       Home Medications    Prior to Admission medications   Medication Sig Start Date End Date Taking? Authorizing Provider  nitroGLYCERIN (NITROSTAT) 0.4 MG SL tablet  Place 1 tablet (0.4 mg total) under the tongue every 5 (five) minutes as needed for chest pain. 05/11/16 05/11/17 Yes Veronese, [place], MD  albuterol (PROVENTIL HFA;VENTOLIN HFA) 108 (90 Base) MCG/ACT inhaler Inhale 2 puffs into the lungs every 6 (six) hours as needed for wheezing or shortness of breath. Patient not taking: Reported on 10/02/2016 05/11/16   Rudene Re, MD  cephALEXin (KEFLEX) 500 MG capsule Take 1 capsule (500 mg total) by mouth 3 (three) times daily. Patient not taking: Reported on 09/08/2016 07/16/16   Waynetta Pean, PA-C  divalproex (DEPAKOTE ER) 500 MG 24 hr tablet Take 1 tablet (500 mg total) by mouth 2 (two) times daily. Patient not taking: Reported on 09/08/2016 05/11/16   Rudene Re, MD  doxycycline (VIBRAMYCIN) 100 MG capsule Take 1 capsule (100 mg total) by mouth 2 (two) times daily. Patient not taking: Reported on 10/02/2016 09/08/16   Ocie Cornfield T, PA-C  insulin aspart protamine- aspart (NOVOLOG MIX 70/30) (70-30) 100 UNIT/ML injection Inject 0.1 mLs (10 Units total) into the skin 2 (two) times daily with a meal. Reported on 04/01/2015 Patient not taking: Reported on 03/07/2016 06/17/15   Domenic Moras, PA-C  lisinopril (PRINIVIL,ZESTRIL) 20 MG tablet Take 1 tablet (20 mg total) by mouth daily. Patient not taking: Reported on 09/08/2016 05/11/16 05/11/17  Rudene Re, MD  metFORMIN (GLUCOPHAGE) 500 MG  tablet Take 2 tablets (1,000 mg total) by mouth 2 (two) times daily with a meal. Patient not taking: Reported on 09/08/2016 05/11/16   Rudene Re, MD  metroNIDAZOLE (FLAGYL) 500 MG tablet Take 1 tablet (500 mg total) by mouth 2 (two) times daily. One tab PO bid x 10 days 10/02/16   Jordani Nunn, Elmyra Ricks, PA-C  ondansetron (ZOFRAN) 4 MG tablet Take 1 tablet (4 mg total) by mouth every 8 (eight) hours as needed for nausea or vomiting. Patient not taking: Reported on 09/08/2016 05/11/16   Rudene Re, MD    Family History Family History  Problem  Relation Age of Onset  . Cancer Father     Social History Social History  Substance Use Topics  . Smoking status: Current Every Day Smoker    Packs/day: 0.50    Years: 30.00    Types: Cigarettes  . Smokeless tobacco: Never Used  . Alcohol use No     Allergies   Aspirin; Bee venom; Other; Naproxen; Toradol [ketorolac tromethamine]; Penicillins; Tramadol; and Vancomycin   Review of Systems Review of Systems  A complete review of systems was obtained and all systems are negative except as noted in the HPI and PMH.    Physical Exam Updated Vital Signs BP 124/72   Pulse 64   Temp 98.7 F (37.1 C) (Oral)   Resp 16   Ht 5\' 2"  (1.575 m)   Wt 88.5 kg (195 lb)   SpO2 96%   BMI 35.67 kg/m   Physical Exam  Constitutional: She is oriented to person, place, and time. She appears well-developed and well-nourished. No distress.  HENT:  Head: Normocephalic.  Mouth/Throat: Oropharynx is clear and moist.  Eyes: Conjunctivae and EOM are normal.  Neck: Normal range of motion.  Cardiovascular: Normal rate.   Pulmonary/Chest: Effort normal. No stridor.  Abdominal: Soft. Bowel sounds are normal. She exhibits no distension and no mass. There is no tenderness. There is no rebound and no guarding.  Positive lower abdominal pain, mostly suprapubic area. No guarding or rebound  Genitourinary:  Genitourinary Comments: No CVA tenderness to percussion bilaterally.  Pelvic exam a chaperoned by technician: No lesions there is diffuse mild erythema on the perineum. Profuse greenish opaque foul-smelling vaginal discharge, mild cervical motion tenderness with no adnexal tenderness.  Musculoskeletal: Normal range of motion.  Neurological: She is alert and oriented to person, place, and time.  Psychiatric: She has a normal mood and affect.  Nursing note and vitals reviewed.    ED Treatments / Results  Labs (all labs ordered are listed, but only abnormal results are displayed) Labs Reviewed    WET PREP, GENITAL - Abnormal; Notable for the following:       Result Value   Trich, Wet Prep PRESENT (*)    WBC, Wet Prep HPF POC MANY (*)    All other components within normal limits  BASIC METABOLIC PANEL - Abnormal; Notable for the following:    Glucose, Bld 147 (*)    All other components within normal limits  CBG MONITORING, ED - Abnormal; Notable for the following:    Glucose-Capillary 140 (*)    All other components within normal limits  CBC WITH DIFFERENTIAL/PLATELET  RPR  HIV ANTIBODY (ROUTINE TESTING)  GC/CHLAMYDIA PROBE AMP ([place]) NOT AT [facility]    EKG  EKG Interpretation None       Radiology US Transvaginal Non-ob  Result Date: 10/02/2016 CLINICAL DATA:  Vaginal discharge, cervical pain EXAM: TRANSABDOMINAL AND TRANSVAGINAL ULTRASOUND OF PELVIS  TECHNIQUE: Both transabdominal and transvaginal ultrasound examinations of the pelvis were performed. Transabdominal technique was performed for global imaging of the pelvis including uterus, ovaries, adnexal regions, and pelvic cul-de-sac. It was necessary to proceed with endovaginal exam following the transabdominal exam to visualize the adnexal region. COMPARISON:  None FINDINGS: Uterus Surgically absent Right ovary Surgically absent Left ovary Surgically absent Other findings No abnormal free fluid.  Cervix appears normal. IMPRESSION: 1. Prior hysterectomy and oophorectomy. No focal abnormality of the pelvis. Electronically Signed   By: Kathreen Devoid   On: 10/02/2016 12:39   US Pelvis Complete  Result Date: 10/02/2016 CLINICAL DATA:  Vaginal discharge, cervical pain EXAM: TRANSABDOMINAL AND TRANSVAGINAL ULTRASOUND OF PELVIS TECHNIQUE: Both transabdominal and transvaginal ultrasound examinations of the pelvis were performed. Transabdominal technique was performed for global imaging of the pelvis including uterus, ovaries, adnexal regions, and pelvic cul-de-sac. It was necessary to proceed with endovaginal exam following the  transabdominal exam to visualize the adnexal region. COMPARISON:  None FINDINGS: Uterus Surgically absent Right ovary Surgically absent Left ovary Surgically absent Other findings No abnormal free fluid.  Cervix appears normal. IMPRESSION: 1. Prior hysterectomy and oophorectomy. No focal abnormality of the pelvis. Electronically Signed   By: Kathreen Devoid   On: 10/02/2016 12:39    Procedures Procedures (including critical care time)  Medications Ordered in ED Medications  acetaminophen (TYLENOL) tablet 1,000 mg (1,000 mg Oral Refused 10/02/16 1240)  metroNIDAZOLE (FLAGYL) tablet 500 mg (500 mg Oral Given 10/02/16 1320)     Initial Impression / Assessment and Plan / ED Course  I have reviewed the triage vital signs and the nursing notes.  Pertinent labs & imaging results that were available during my care of the patient were reviewed by me and considered in my medical decision making (see chart for details).    Vitals:   10/02/16 1029 10/02/16 1057 10/02/16 1320  BP:  122/71 124/72  Pulse:  61 64  Resp:  18 16  Temp:  98.7 F (37.1 C)   TempSrc:  Oral   SpO2: 99% 95% 96%  Weight:  88.5 kg (195 lb)   Height:  5\' 2"  (1.575 m)     Medications  acetaminophen (TYLENOL) tablet 1,000 mg (1,000 mg Oral Refused 10/02/16 1240)  metroNIDAZOLE (FLAGYL) tablet 500 mg (500 mg Oral Given 10/02/16 1320)    Carol Hughes is 51 y.o. female presenting with Lower abdominal pain and vaginal discharge onset this morning she was seen for similar several weeks ago she had unprotected sex with her ex-boyfriend 5 days ago. Chart review shows that she was negative for gonorrhea and chlamydia at that time, she was treated for presumptive PID and she states she was compliant with her antibiotics. Patient afebrile and nontoxic appearing, pelvic exam with profuse discharge and cervical motion tenderness. Will obtain ultrasound to evaluate for TOA.  Basic blood work reassuring. Ultrasound with no abscess. Wet  prep with trichomoniasis. She was treated with 2 g of Flagyl several weeks ago. I will switch her to 500 twice a day for 10 days and advised her that she will make her partners aware that they need treatment, [facility] GC chlamydia from several weeks ago was negative, will hold off on cervicitis treatment this visit. She does have a cell phone and is able to be reached by phone.  Evaluation does not show pathology that would require ongoing emergent intervention or inpatient treatment. Pt is hemodynamically stable and mentating appropriately. Discussed findings and plan  with patient/guardian, who agrees with care plan. All questions answered. Return precautions discussed and outpatient follow up given.      Final Clinical Impressions(s) / ED Diagnoses   Final diagnoses:  Vaginal discharge  Trichimoniasis    New Prescriptions Discharge Medication List as of 10/02/2016 12:53 PM       Mikhail Hallenbeck, Charna [place] 10/02/16 1452    Long, Wonda [place], MD 10/02/16 1545

## 2016-10-02 NOTE — ED Triage Notes (Signed)
Per [facility] pt brought in c/o vaginal discharge and itching after sexual intercourse. Pt states hx of same. C/o yellow, foul odor discharge. Pt states she was also stung by a bee on her back yesterday. Pt adds she cannot keep anything down since this am. Pt alert & ambulatory.

## 2016-10-02 NOTE — ED Notes (Signed)
Bed: KD59 Expected date:  Expected time:  Means of arrival:  Comments: EMS itching, discharge

## 2016-10-02 NOTE — ED Notes (Signed)
Pt is getting increasingly agitated at being told she cannot have food. PA made aware

## 2016-10-02 NOTE — Discharge Instructions (Signed)
Please instruct her partner that they will need to be treated for trichomoniasis.  Please follow with your primary care doctor in the next 2 days for a check-up. They must obtain records for further management.   Do not hesitate to return to the Emergency Department for any new, worsening or concerning symptoms.

## 2016-10-02 NOTE — ED Notes (Signed)
Pt states "I take tylenol like candy. I am not taking that mess here because it aint going to do nothing to help. If you want to help me you will give me something to eat."

## 2016-10-03 LAB — RPR: RPR: NONREACTIVE

## 2016-10-03 LAB — HIV ANTIBODY (ROUTINE TESTING W REFLEX): HIV SCREEN 4TH GENERATION: NONREACTIVE

## 2016-10-05 LAB — GC/CHLAMYDIA PROBE AMP (~~LOC~~) NOT AT ARMC
Chlamydia: NEGATIVE
Neisseria Gonorrhea: NEGATIVE

## 2016-10-10 MED ORDER — METRONIDAZOLE 500 MG PO TABS: 500.0000 mg | ORAL_TABLET | Freq: Two times a day (BID) | ORAL | 0 refills | Status: DC

## 2016-10-20 ENCOUNTER — Emergency Department (HOSPITAL_COMMUNITY): Payer: Medicaid Other

## 2016-10-20 ENCOUNTER — Other Ambulatory Visit: Payer: Self-pay

## 2016-10-20 ENCOUNTER — Emergency Department (HOSPITAL_COMMUNITY)
Admission: EM | Admit: 2016-10-20 | Discharge: 2016-10-21 | Disposition: A | Discharge status: Home / Self Care | Payer: Medicaid Other | Attending: Emergency Medicine | Admitting: Emergency Medicine

## 2016-10-20 ENCOUNTER — Encounter (HOSPITAL_COMMUNITY): Payer: Self-pay | Admitting: Emergency Medicine

## 2016-10-20 DIAGNOSIS — Z85818 Personal history of malignant neoplasm of other sites of lip, oral cavity, and pharynx: Secondary | ICD-10-CM | POA: Diagnosis not present

## 2016-10-20 DIAGNOSIS — F319 Bipolar disorder, unspecified: Secondary | ICD-10-CM | POA: Insufficient documentation

## 2016-10-20 DIAGNOSIS — I10 Essential (primary) hypertension: Secondary | ICD-10-CM | POA: Insufficient documentation

## 2016-10-20 DIAGNOSIS — F209 Schizophrenia, unspecified: Secondary | ICD-10-CM | POA: Diagnosis not present

## 2016-10-20 DIAGNOSIS — J449 Chronic obstructive pulmonary disease, unspecified: Secondary | ICD-10-CM | POA: Diagnosis not present

## 2016-10-20 DIAGNOSIS — E119 Type 2 diabetes mellitus without complications: Secondary | ICD-10-CM | POA: Diagnosis not present

## 2016-10-20 DIAGNOSIS — F17210 Nicotine dependence, cigarettes, uncomplicated: Secondary | ICD-10-CM | POA: Insufficient documentation

## 2016-10-20 DIAGNOSIS — R079 Chest pain, unspecified: Secondary | ICD-10-CM

## 2016-10-20 DIAGNOSIS — I2510 Atherosclerotic heart disease of native coronary artery without angina pectoris: Secondary | ICD-10-CM | POA: Diagnosis not present

## 2016-10-20 DIAGNOSIS — Z9049 Acquired absence of other specified parts of digestive tract: Secondary | ICD-10-CM | POA: Insufficient documentation

## 2016-10-20 LAB — CBC WITH DIFFERENTIAL/PLATELET
BASOS PCT: 0 %
Basophils Absolute: 0 10*3/uL (ref 0.0–0.1)
EOS ABS: 0.1 10*3/uL (ref 0.0–0.7)
Eosinophils Relative: 1 %
HEMATOCRIT: 38.5 % (ref 36.0–46.0)
Hemoglobin: 12.8 g/dL (ref 12.0–15.0)
LYMPHS ABS: 3 10*3/uL (ref 0.7–4.0)
Lymphocytes Relative: 37 %
MCH: 29.8 pg (ref 26.0–34.0)
MCHC: 33.2 g/dL (ref 30.0–36.0)
MCV: 89.5 fL (ref 78.0–100.0)
MONO ABS: 0.6 10*3/uL (ref 0.1–1.0)
MONOS PCT: 8 %
Neutro Abs: 4.4 10*3/uL (ref 1.7–7.7)
Neutrophils Relative %: 54 %
Platelets: 193 10*3/uL (ref 150–400)
RBC: 4.3 MIL/uL (ref 3.87–5.11)
RDW: 13 % (ref 11.5–15.5)
WBC: 8.1 10*3/uL (ref 4.0–10.5)

## 2016-10-20 LAB — RAPID URINE DRUG SCREEN, HOSP PERFORMED
AMPHETAMINES: NOT DETECTED
BENZODIAZEPINES: NOT DETECTED
Barbiturates: NOT DETECTED
Cocaine: NOT DETECTED
Opiates: NOT DETECTED
Tetrahydrocannabinol: NOT DETECTED

## 2016-10-20 LAB — BASIC METABOLIC PANEL
Anion gap: 8 (ref 5–15)
BUN: 7 mg/dL (ref 6–20)
CALCIUM: 8.8 mg/dL — AB (ref 8.9–10.3)
CO2: 26 mmol/L (ref 22–32)
CREATININE: 0.63 mg/dL (ref 0.44–1.00)
Chloride: 103 mmol/L (ref 101–111)
GFR calc non Af Amer: >60 mL/min (ref >60)
Glucose, Bld: 139 mg/dL — ABNORMAL HIGH (ref 65–99)
Potassium: 3.7 mmol/L (ref 3.5–5.1)
SODIUM: 137 mmol/L (ref 135–145)

## 2016-10-20 LAB — CBG MONITORING, ED: Glucose-Capillary: 173 mg/dL — ABNORMAL HIGH (ref 65–99)

## 2016-10-20 LAB — I-STAT TROPONIN, ED: TROPONIN I, POC: 0.01 ng/mL (ref 0.00–0.08)

## 2016-10-20 MED ORDER — DEXAMETHASONE SODIUM PHOSPHATE 10 MG/ML IJ SOLN
10.0000 mg | Freq: Once | INTRAMUSCULAR | Status: AC
  Administered 2016-10-20: 10 mg via INTRAVENOUS
  Filled 2016-10-20: qty 1

## 2016-10-20 MED ORDER — METOCLOPRAMIDE HCL 5 MG/ML IJ SOLN
10.0000 mg | INTRAMUSCULAR | Status: AC
  Administered 2016-10-20: 10 mg via INTRAVENOUS
  Filled 2016-10-20: qty 2

## 2016-10-20 MED ORDER — LIDOCAINE 5 % EX PTCH
1.0000 | MEDICATED_PATCH | CUTANEOUS | Status: DC
  Administered 2016-10-20: 1 via TRANSDERMAL
  Filled 2016-10-20: qty 1

## 2016-10-20 NOTE — ED Triage Notes (Addendum)
Per EMS, pt reports 10/10 mid chest pressure that started today. Pt took one nitro before EMS arrival with minimal relief. EMS gave pt 324 ASA. Pt reports some sob and dizziness. Pt reports the other day she had a syncopal episode where she passed out for a few seconds. Pt has a hx of COPD, MI 11 years ago, HTN, diabetes and seizures. Pt reports she has been really tired lately and that her sugar is low (pt states that it is normally between 700-800). Pt states she is compliant with her BP and diabetes medications. EMS VS BP 139/72 and CBG 132. Pt is homeless.

## 2016-10-20 NOTE — ED Notes (Signed)
ED Provider at bedside. 

## 2016-10-20 NOTE — ED Notes (Signed)
Patient transported to X-ray 

## 2016-10-20 NOTE — ED Provider Notes (Signed)
[facility] DEPT Provider Note   CSN: [id number] Arrival date & time: 10/20/16  1950    History   Chief Complaint Chief Complaint  Patient presents with  . Chest Pain    HPI Carol Hughes is a 51 y.o. female.  51 year old female with a history of COPD, coronary artery disease, hypertension, diabetes mellitus, schizophrenia, and reported uterine cancer (allegedly followed by [facility]) presents to the emergency department for chest pain. Patient notes that she has been under increased stress lately stating, "I'm happy and people are trying to make me miserable". She notes onset of chest pain just prior to arrival. She describes the pain as sharp and pleuritic, worse with deep breathing. She has had associated shortness of breath as well as dizziness characterized as the room spinning. She also states that she feels nauseous, but notes that this is "because I haven't eaten". Patient reports taking one sublingual nitroglycerin tablet with minimal relief of her pain. EMS gave 324 mg aspirin prior to arrival. Patient denies recent fevers as well as associated vomiting and lightheadedness. She states that she had an MI 11 years ago and consented to a cath, but "wouldn't let them put a stent in".    The history is provided by the patient. No language interpreter was used.    Past Medical History:  Diagnosis Date  . Addiction to drug ([place])   . Cancer (HCC) throat  . COPD (chronic obstructive pulmonary disease) ([place])   . Coronary artery disease   . Diabetes mellitus without complication ([place])   . Hypertension   . Migraine   . Obesity   . Schizophrenia ([place])   . Seizures ([place])   . Sleep apnea   . Throat cancer [facility])     Patient Active Problem List   Diagnosis Date Noted  . Mastitis in female 07/05/2012  . Dehydration 07/05/2012  . Seizure disorder ([place]) 07/05/2012  . Chest pain 05/04/2012  . COPD (chronic obstructive pulmonary disease) ([place]) 05/04/2012  . Diabetes mellitus, type 2  ([place]) 05/04/2012  . Bipolar disorder ([place]) 05/04/2012    Past Surgical History:  Procedure Laterality Date  . ABDOMINAL HYSTERECTOMY    . APPENDECTOMY    . CESAREAN SECTION    . CHOLECYSTECTOMY    . EYE SURGERY    . KNEE SURGERY      OB History    No data available       Home Medications    Prior to Admission medications   Medication Sig Start Date End Date Taking? Authorizing Provider  albuterol (PROVENTIL HFA;VENTOLIN HFA) 108 (90 Base) MCG/ACT inhaler Inhale 2 puffs into the lungs every 6 (six) hours as needed for wheezing or shortness of breath. Patient not taking: Reported on 10/02/2016 05/11/16   Rudene Re, MD  cephALEXin (KEFLEX) 500 MG capsule Take 1 capsule (500 mg total) by mouth 3 (three) times daily. Patient not taking: Reported on 09/08/2016 07/16/16   Waynetta Pean, PA-C  divalproex (DEPAKOTE ER) 500 MG 24 hr tablet Take 1 tablet (500 mg total) by mouth 2 (two) times daily. Patient not taking: Reported on 09/08/2016 05/11/16   Rudene Re, MD  doxycycline (VIBRAMYCIN) 100 MG capsule Take 1 capsule (100 mg total) by mouth 2 (two) times daily. Patient not taking: Reported on 10/02/2016 09/08/16   Ocie Cornfield T, PA-C  insulin aspart protamine- aspart (NOVOLOG MIX 70/30) (70-30) 100 UNIT/ML injection Inject 0.1 mLs (10 Units total) into the skin 2 (two) times daily with a meal. Reported on 04/01/2015  Patient not taking: Reported on 03/07/2016 06/17/15   Domenic Moras, PA-C  lisinopril (PRINIVIL,ZESTRIL) 20 MG tablet Take 1 tablet (20 mg total) by mouth daily. Patient not taking: Reported on 09/08/2016 05/11/16 05/11/17  Rudene Re, MD  metFORMIN (GLUCOPHAGE) 500 MG tablet Take 2 tablets (1,000 mg total) by mouth 2 (two) times daily with a meal. Patient not taking: Reported on 09/08/2016 05/11/16   Rudene Re, MD  metroNIDAZOLE (FLAGYL) 500 MG tablet Take 1 tablet (500 mg total) by mouth 2 (two) times daily. One tab PO bid x 10 days 10/10/16    Dalia Heading, PA-C  nitroGLYCERIN (NITROSTAT) 0.4 MG SL tablet Place 1 tablet (0.4 mg total) under the tongue every 5 (five) minutes as needed for chest pain. 05/11/16 05/11/17  Rudene Re, MD  ondansetron (ZOFRAN) 4 MG tablet Take 1 tablet (4 mg total) by mouth every 8 (eight) hours as needed for nausea or vomiting. Patient not taking: Reported on 09/08/2016 05/11/16   Rudene Re, MD    Family History Family History  Problem Relation Age of Onset  . Cancer Father     Social History Social History  Substance Use Topics  . Smoking status: Current Every Day Smoker    Packs/day: 0.50    Years: 30.00    Types: Cigarettes  . Smokeless tobacco: Never Used  . Alcohol use No     Allergies   Aspirin; Bee venom; Other; Naproxen; Toradol [ketorolac tromethamine]; Penicillins; Tramadol; and Vancomycin   Review of Systems Review of Systems Ten systems reviewed and are negative for acute change, except as noted in the HPI.    Physical Exam Updated Vital Signs BP 130/84   Pulse 63   Temp 98 F (36.7 C) (Oral)   Resp 20   SpO2 98%   Physical Exam  Constitutional: She is oriented to person, place, and time. She appears well-developed and well-nourished. No distress.  Nontoxic and in NAD  HENT:  Head: Normocephalic and atraumatic.  Eyes: Conjunctivae and EOM are normal. No scleral icterus.  Neck: Normal range of motion.  No JVD  Cardiovascular: Normal rate, regular rhythm and intact distal pulses.   Pulmonary/Chest: Effort normal. No respiratory distress. She has no wheezes. She has no rales.  Lungs CTAB. Respirations even and unlabored.  Musculoskeletal: Normal range of motion.  Neurological: She is alert and oriented to person, place, and time. She exhibits normal muscle tone. Coordination normal.  GCS 15. Patient moving all extremities.  Skin: Skin is warm and dry. No rash noted. She is not diaphoretic. No erythema. No pallor.  Psychiatric: She has a  normal mood and affect. Her behavior is normal.  Nursing note and vitals reviewed.    ED Treatments / Results  Labs (all labs ordered are listed, but only abnormal results are displayed) Labs Reviewed  BASIC METABOLIC PANEL - Abnormal; Notable for the following:       Result Value   Glucose, Bld 139 (*)    Calcium 8.8 (*)    All other components within normal limits  CBG MONITORING, ED - Abnormal; Notable for the following:    Glucose-Capillary 173 (*)    All other components within normal limits  RAPID URINE DRUG SCREEN, HOSP PERFORMED  CBC WITH DIFFERENTIAL/PLATELET  I-STAT TROPONIN, ED  I-STAT TROPONIN, ED    EKG  EKG Interpretation  Date/Time:  Tuesday October 20 2016 19:54:08 EDT Ventricular Rate:  53 PR Interval:  166 QRS Duration: 86 QT Interval:  432 QTC Calculation: 405  R Axis:   65 Text Interpretation:  Sinus bradycardia with sinus arrhythmia Septal infarct , age undetermined Abnormal ECG Since last tracing rate slower Confirmed by Daleen Bo [phone]) on 10/20/2016 9:23:42 PM       Radiology Dg Chest 2 View  Result Date: 10/20/2016 CLINICAL DATA:  51 year old female with chest pain and shortness of breath. EXAM: CHEST  2 VIEW COMPARISON:  Chest radiograph dated 03/07/2016 FINDINGS: The heart size and mediastinal contours are within normal limits. Both lungs are clear. The visualized skeletal structures are unremarkable. IMPRESSION: No active cardiopulmonary disease. Electronically Signed   By: Anner Crete M.D.   On: 10/20/2016 21:30    Procedures Procedures (including critical care time)  Medications Ordered in ED Medications  lidocaine (LIDODERM) 5 % 1 patch (1 patch Transdermal Patch Applied 10/20/16 2220)  metoCLOPramide (REGLAN) injection 10 mg (10 mg Intravenous Given 10/20/16 2147)  dexamethasone (DECADRON) injection 10 mg (10 mg Intravenous Given 10/20/16 2147)    2345 - Patient sleeping comfortably on reassessment. Denies pain upon waking. She  is in NAD. VSS.   Initial Impression / Assessment and Plan / ED Course  I have reviewed the triage vital signs and the nursing notes.  Pertinent labs & imaging results that were available during my care of the patient were reviewed by me and considered in my medical decision making (see chart for details).     51 year old female presents to the emergency department for chest pain. She states that she has been under increased stress lately. Symptoms have resolved spontaneously while in the emergency department. On reassessment, the patient is sleeping and complains of no pain upon waking.   She does have risk factors for acute coronary event including CAD, diabetes, obesity, and tobacco use. Heart score 3-4 based on suspicion of cardiac etiology. Symptoms, however, are atypical and workup today is reassuring. Chest x-ray shows no evidence of pneumonia, pneumothorax, pleural effusion. No mediastinal widening to suggest dissection. Vital signs have been stable.   Plan for continued workup on an outpatient basis. Patient referred to her primary care doctor for follow-up. Return precautions provided. Patient discharged in stable condition with no unaddressed concerns.   Vitals:   10/20/16 2004 10/20/16 2015 10/20/16 2030 10/20/16 2147  BP: 128/78 136/83 129/73 130/84  Pulse: (!) 53 (!) 56 61 63  Resp: 18  18 20   Temp: 98 F (36.7 C)     TempSrc: Oral     SpO2: 100% 100% 99% 98%    Final Clinical Impressions(s) / ED Diagnoses   Final diagnoses:  Nonspecific chest pain    New Prescriptions New Prescriptions   No medications on file     Carol Breach, Hershal Coria 10/21/16 0102    Daleen Bo, MD 10/22/16 [phone]

## 2016-10-21 LAB — I-STAT TROPONIN, ED: TROPONIN I, POC: 0 ng/mL (ref 0.00–0.08)

## 2016-10-21 NOTE — Discharge Instructions (Addendum)
Your cardiac workup today was reassuring and we were unable to find a life threatening cause of your chest pain. We advise that you follow-up with your primary care doctor if your symptoms persist. You may take Tylenol or ibuprofen for persistent pain. Return to the emergency department for new or concerning symptoms.

## 2016-10-31 IMAGING — CT CT HEAD W/O CM
2 series · 17 of 30 positions shown, 20 images · non-contrast
Comparison: February 22, 2014

CLINICAL DATA: Seizure 3 days ago.

EXAM:
CT HEAD WITHOUT CONTRAST
TECHNIQUE: Contiguous axial images were obtained from the base of the skull
through the vertex without intravenous contrast.

[Series 2: head w/o · axial · non-contrast · 0.41mm/px · z∈[+1452,+1572]mm · 9 of 30 slices shown, 12 images]
[im 3/30  brain]
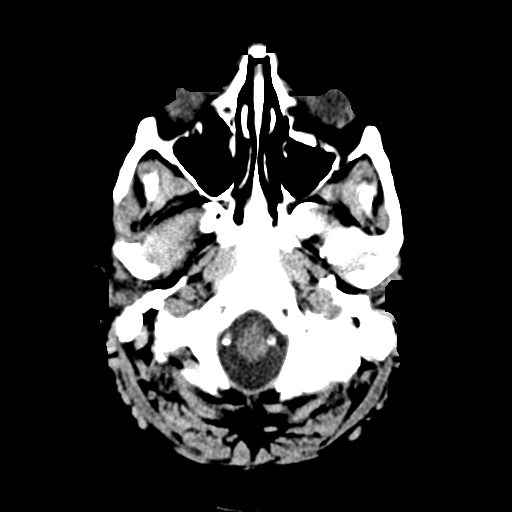
[im 3/30  bone]
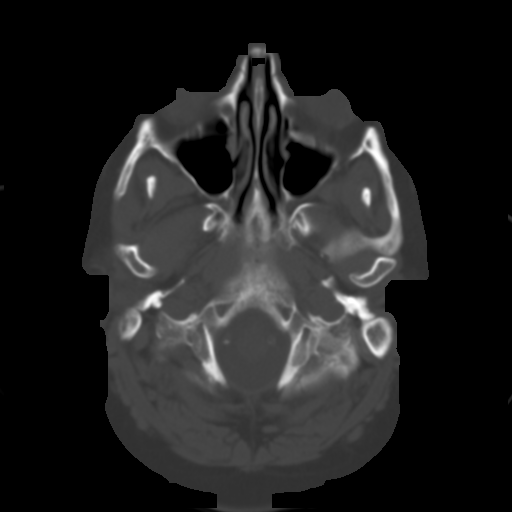
[im 6/30  brain]
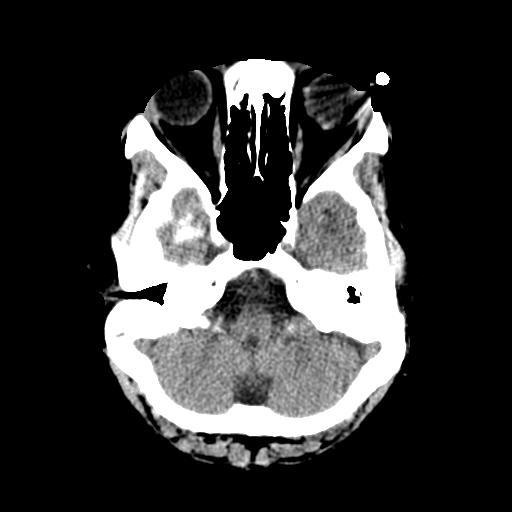
[im 9/30  brain]
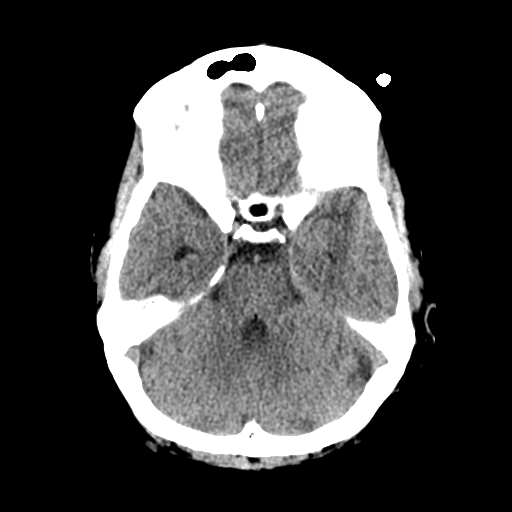
[im 12/30  brain]
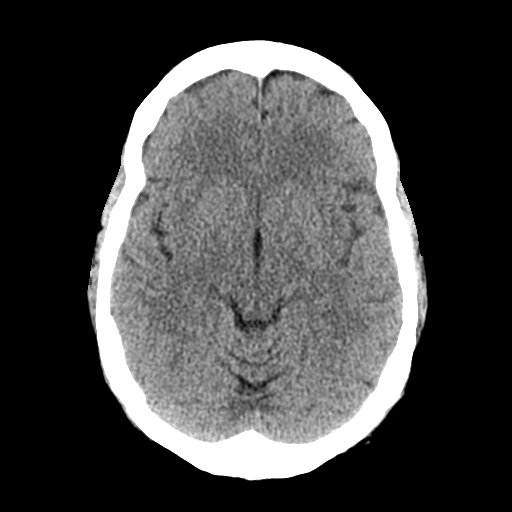
[im 15/30  brain]
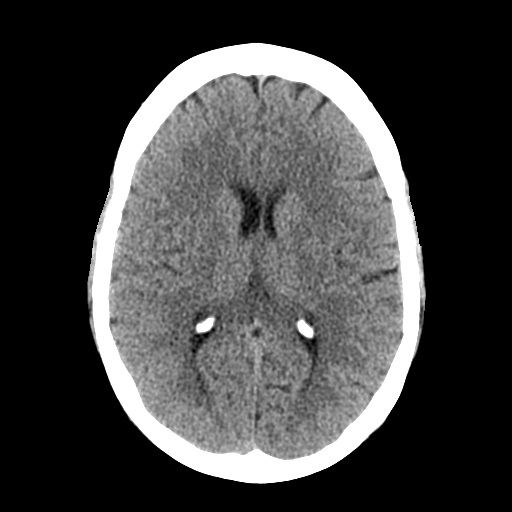
[im 15/30  bone]
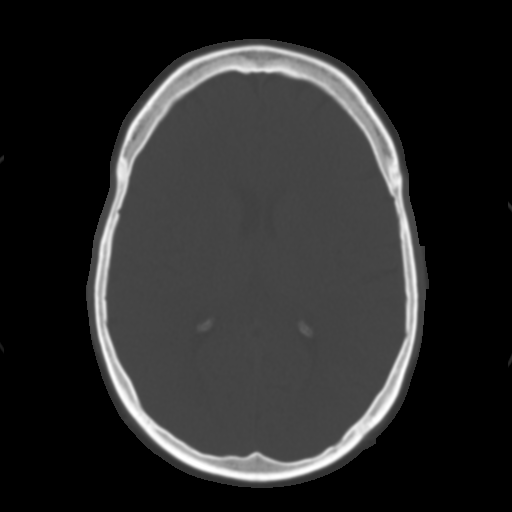
[im 18/30  brain]
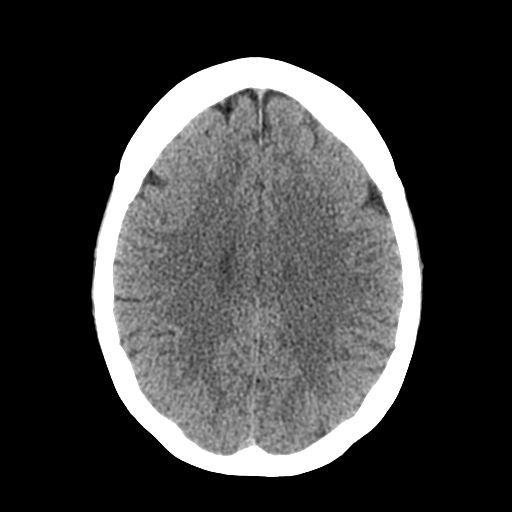
[im 21/30  brain]
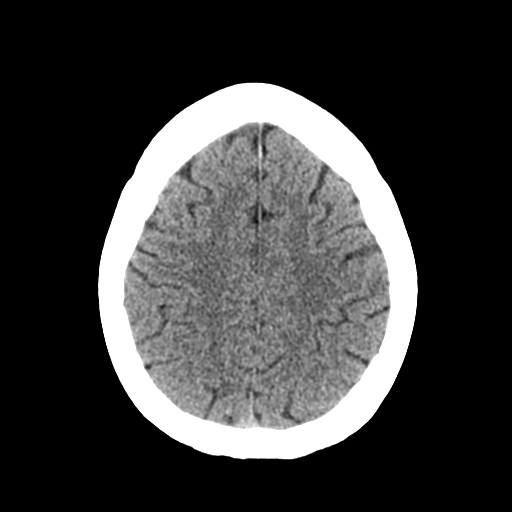
[im 24/30  brain]
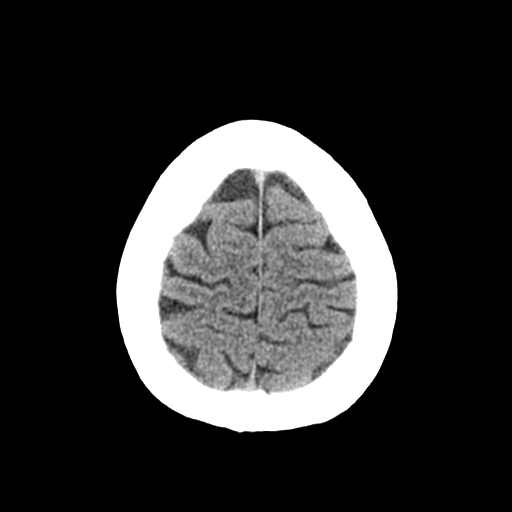
[im 27/30  brain]
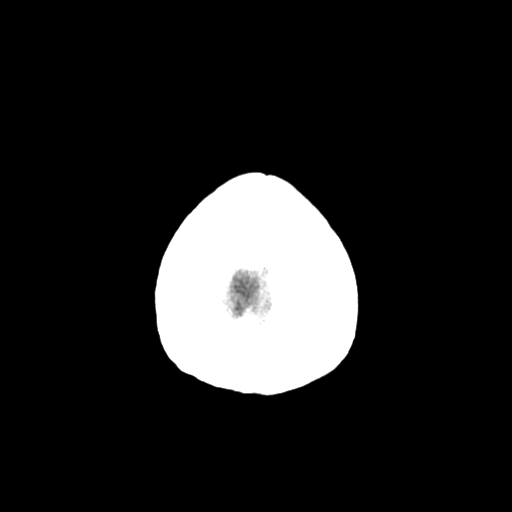
[im 27/30  bone]
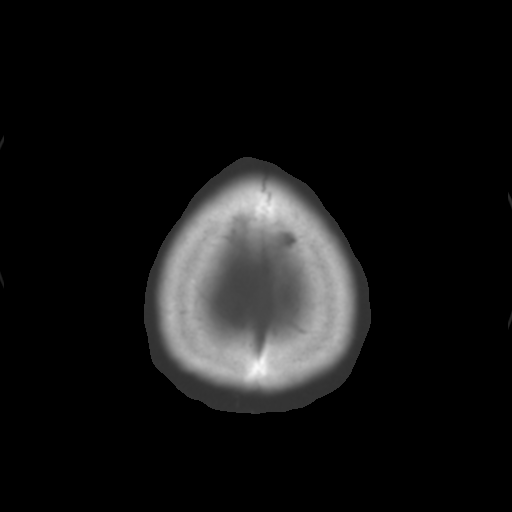

[Series 3: bone windows · axial · 0.41mm/px · z∈[+1457,+1571]mm · 8 of 50 slices shown]
[im 6/50  bone]
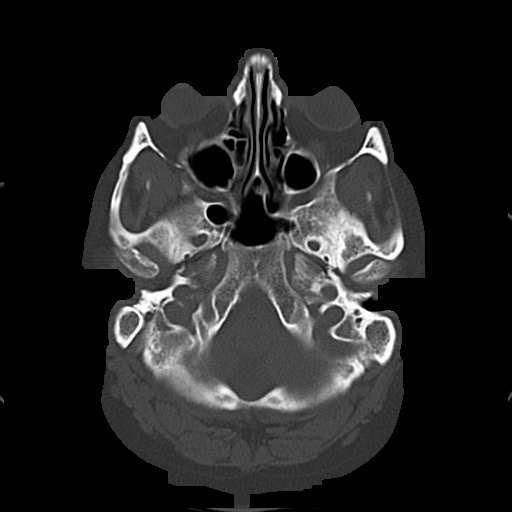
[im 11/50  bone]
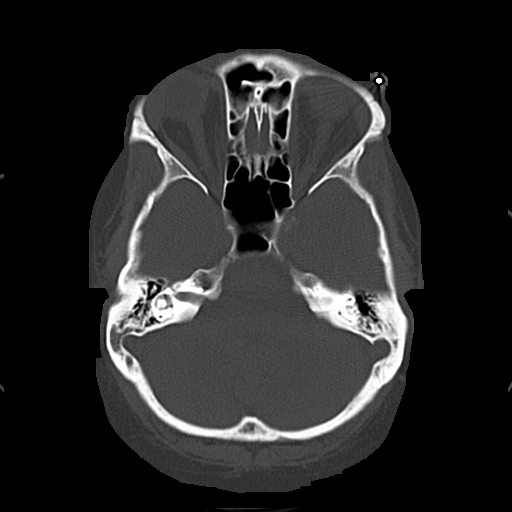
[im 17/50  bone]
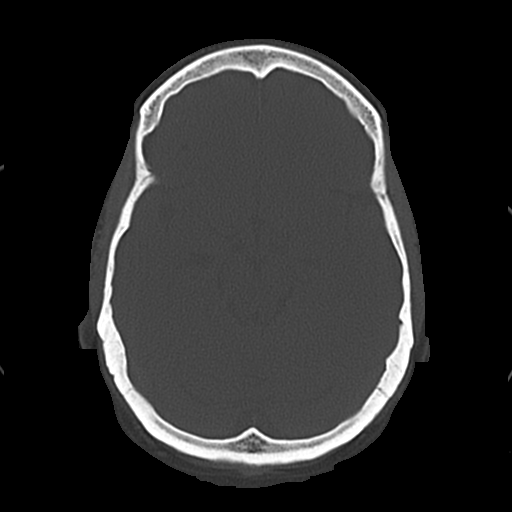
[im 22/50  bone]
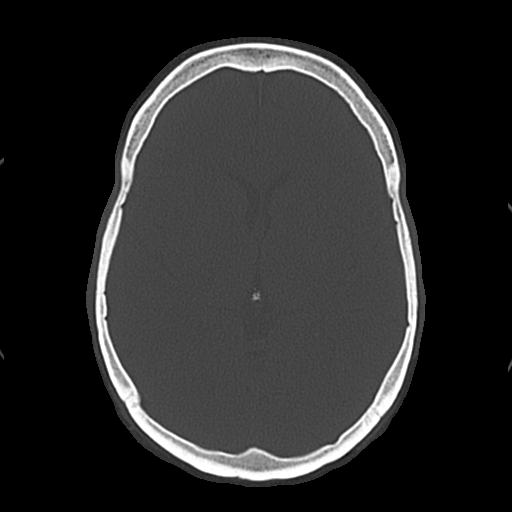
[im 28/50  bone]
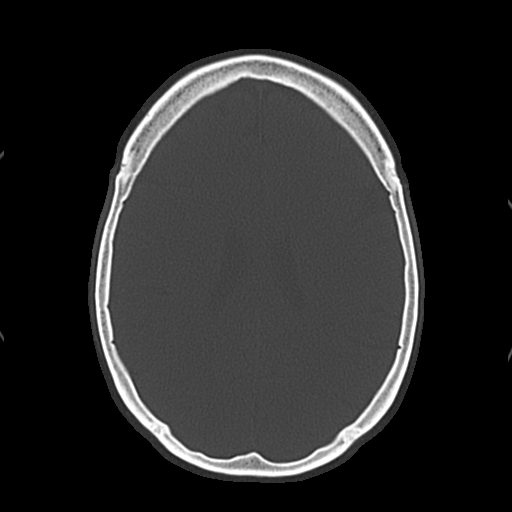
[im 33/50  bone]
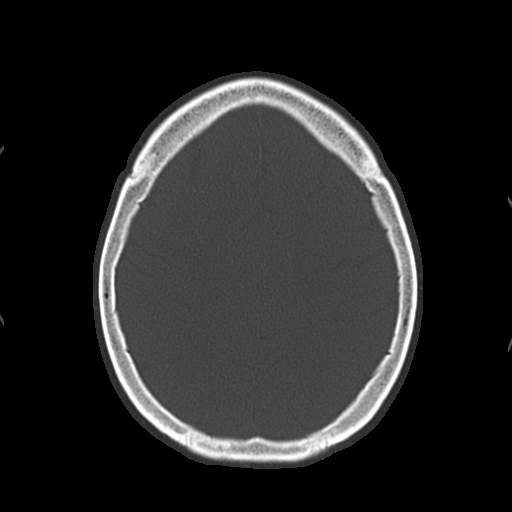
[im 39/50  bone]
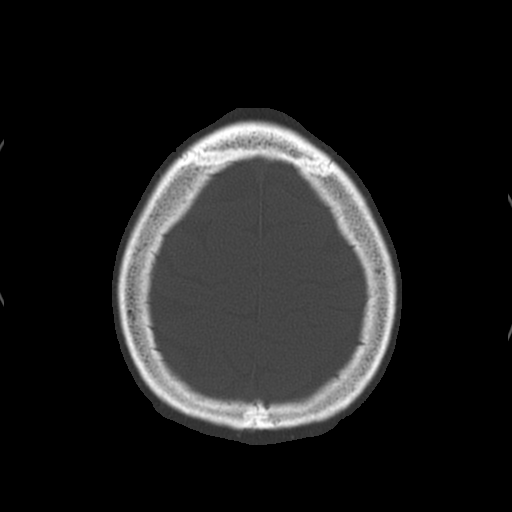
[im 44/50  bone]
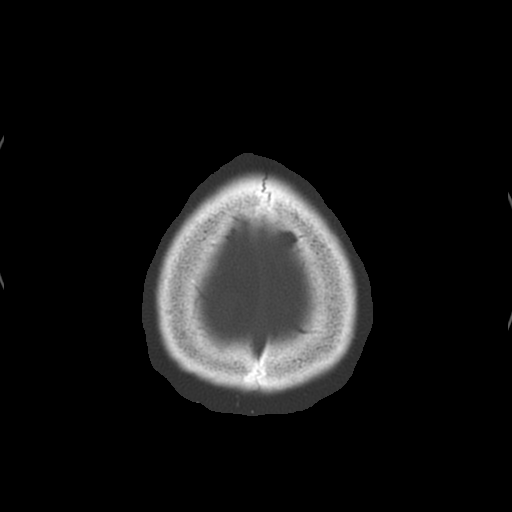

[17 of 30 positions shown; findings below may reference images not displayed]

FINDINGS: There is no midline shift, hydrocephalus, or mass. No acute
hemorrhage or acute transcortical infarct is identified. The bony
calvarium is intact. The visualized sinuses are clear.
IMPRESSION: No focal acute intracranial abnormality identified. No focal
hemorrhage or acute transcortical infarct.

## 2016-11-23 ENCOUNTER — Encounter (HOSPITAL_COMMUNITY): Payer: Self-pay | Admitting: Emergency Medicine

## 2016-11-23 ENCOUNTER — Emergency Department (HOSPITAL_COMMUNITY)
Admission: EM | Admit: 2016-11-23 | Discharge: 2016-11-23 | Disposition: A | Discharge status: Home / Self Care | Payer: Medicaid Other | Attending: Emergency Medicine | Admitting: Emergency Medicine

## 2016-11-23 DIAGNOSIS — Z5321 Procedure and treatment not carried out due to patient leaving prior to being seen by health care provider: Secondary | ICD-10-CM | POA: Insufficient documentation

## 2016-11-23 DIAGNOSIS — R42 Dizziness and giddiness: Secondary | ICD-10-CM | POA: Insufficient documentation

## 2016-11-23 NOTE — ED Notes (Signed)
Pt walks past the desk when as where she was going or did she need help with anything, pt states "Im going out to meet my son,"  Writer informed pt that she was not supposed to leave unless she no longer wanted to be treated.  Pt sts that she was leaving and there was nothing we could say to make her stay.  Writer informed nurse who RN who came to the desk at end of conversation.

## 2016-11-23 NOTE — ED Triage Notes (Signed)
Pt states that she has a hx of vertigo and thinks that is what is going on with her today. Dizziness and sleepy at this time. Alert and oriented.

## 2016-11-28 ENCOUNTER — Emergency Department (HOSPITAL_COMMUNITY)
Admission: EM | Admit: 2016-11-28 | Discharge: 2016-11-29 | Disposition: A | Discharge status: Home / Self Care | Payer: Medicaid Other | Attending: Emergency Medicine | Admitting: Emergency Medicine

## 2016-11-28 ENCOUNTER — Encounter (HOSPITAL_COMMUNITY): Payer: Self-pay | Admitting: Emergency Medicine

## 2016-11-28 DIAGNOSIS — R55 Syncope and collapse: Secondary | ICD-10-CM | POA: Diagnosis not present

## 2016-11-28 DIAGNOSIS — I2510 Atherosclerotic heart disease of native coronary artery without angina pectoris: Secondary | ICD-10-CM | POA: Diagnosis not present

## 2016-11-28 DIAGNOSIS — Z85819 Personal history of malignant neoplasm of unspecified site of lip, oral cavity, and pharynx: Secondary | ICD-10-CM | POA: Insufficient documentation

## 2016-11-28 DIAGNOSIS — R5383 Other fatigue: Secondary | ICD-10-CM | POA: Diagnosis not present

## 2016-11-28 DIAGNOSIS — I10 Essential (primary) hypertension: Secondary | ICD-10-CM | POA: Insufficient documentation

## 2016-11-28 DIAGNOSIS — R42 Dizziness and giddiness: Secondary | ICD-10-CM | POA: Diagnosis not present

## 2016-11-28 DIAGNOSIS — J449 Chronic obstructive pulmonary disease, unspecified: Secondary | ICD-10-CM | POA: Diagnosis not present

## 2016-11-28 DIAGNOSIS — Z79899 Other long term (current) drug therapy: Secondary | ICD-10-CM | POA: Insufficient documentation

## 2016-11-28 DIAGNOSIS — Z794 Long term (current) use of insulin: Secondary | ICD-10-CM | POA: Insufficient documentation

## 2016-11-28 DIAGNOSIS — F17210 Nicotine dependence, cigarettes, uncomplicated: Secondary | ICD-10-CM | POA: Diagnosis not present

## 2016-11-28 DIAGNOSIS — E119 Type 2 diabetes mellitus without complications: Secondary | ICD-10-CM | POA: Diagnosis not present

## 2016-11-28 MED ORDER — SODIUM CHLORIDE 0.9 % IV BOLUS (SEPSIS)
1000.0000 mL | Freq: Once | INTRAVENOUS | Status: AC
  Administered 2016-11-29: 1000 mL via INTRAVENOUS

## 2016-11-28 NOTE — ED Triage Notes (Addendum)
Pt to ED via [facility] after reported having a syncopal episode while at the [facility].  Pt st's she has vertigo and sometimes it makes her pass out.  Pt's only complaint is pain in right side of neck radiating into right shoulder x's 3 days.  Pt st's she is homeless  Pt alert and oriented x's 3

## 2016-11-28 NOTE — ED Notes (Signed)
Pt st's she just wants to sleep.

## 2016-11-29 LAB — BASIC METABOLIC PANEL
Anion gap: 10 (ref 5–15)
BUN: 20 mg/dL (ref 6–20)
CHLORIDE: 94 mmol/L — AB (ref 101–111)
CO2: 28 mmol/L (ref 22–32)
Calcium: 8.6 mg/dL — ABNORMAL LOW (ref 8.9–10.3)
Creatinine, Ser: 0.87 mg/dL (ref 0.44–1.00)
GFR calc non Af Amer: >60 mL/min (ref >60)
Glucose, Bld: 215 mg/dL — ABNORMAL HIGH (ref 65–99)
POTASSIUM: 3.3 mmol/L — AB (ref 3.5–5.1)
SODIUM: 132 mmol/L — AB (ref 135–145)

## 2016-11-29 LAB — CBC WITH DIFFERENTIAL/PLATELET
BASOS ABS: 0 10*3/uL (ref 0.0–0.1)
BASOS PCT: 0 %
Eosinophils Absolute: 0.1 10*3/uL (ref 0.0–0.7)
Eosinophils Relative: 2 %
HEMATOCRIT: 38.4 % (ref 36.0–46.0)
HEMOGLOBIN: 13 g/dL (ref 12.0–15.0)
LYMPHS ABS: 3 10*3/uL (ref 0.7–4.0)
LYMPHS PCT: 46 %
MCH: 30.6 pg (ref 26.0–34.0)
MCHC: 33.9 g/dL (ref 30.0–36.0)
MCV: 90.4 fL (ref 78.0–100.0)
Monocytes Absolute: 0.7 10*3/uL (ref 0.1–1.0)
Monocytes Relative: 10 %
NEUTROS PCT: 42 %
Neutro Abs: 2.7 10*3/uL (ref 1.7–7.7)
Platelets: 119 10*3/uL — ABNORMAL LOW (ref 150–400)
RBC: 4.25 MIL/uL (ref 3.87–5.11)
RDW: 12.8 % (ref 11.5–15.5)
WBC: 6.5 10*3/uL (ref 4.0–10.5)

## 2016-11-29 LAB — RAPID URINE DRUG SCREEN, HOSP PERFORMED
Amphetamines: NOT DETECTED
Barbiturates: NOT DETECTED
Benzodiazepines: NOT DETECTED
Cocaine: POSITIVE — AB
Opiates: NOT DETECTED
Tetrahydrocannabinol: NOT DETECTED

## 2016-11-29 LAB — D-DIMER, QUANTITATIVE: D-Dimer, Quant: 0.44 ug/mL-FEU (ref 0.00–0.50)

## 2016-11-29 LAB — URINALYSIS, ROUTINE W REFLEX MICROSCOPIC
Bilirubin Urine: NEGATIVE
GLUCOSE, UA: NEGATIVE mg/dL
KETONES UR: 5 mg/dL — AB
LEUKOCYTES UA: NEGATIVE
NITRITE: NEGATIVE
PROTEIN: NEGATIVE mg/dL
Specific Gravity, Urine: 1.018 (ref 1.005–1.030)
pH: 5 (ref 5.0–8.0)

## 2016-11-29 LAB — CBG MONITORING, ED: Glucose-Capillary: 226 mg/dL — ABNORMAL HIGH (ref 65–99)

## 2016-11-29 NOTE — ED Notes (Signed)
Bedside commode with stand by assist, pt very weak and unsteady. Pt able to provide urine sample

## 2016-11-29 NOTE — ED Notes (Signed)
CBG did not cross over... [place]

## 2016-11-29 NOTE — ED Provider Notes (Signed)
[facility] DEPT Provider Note   CSN: [id number] Arrival date & time: 11/28/16  2229     History   Chief Complaint Chief Complaint  Patient presents with  . Loss of Consciousness    HPI Carol Hughes is a 51 y.o. female.  HPI Pt comes in with cc of syncope. Pt has hx of OSA, COPD, CAD, DM and remote hx of cancer. She reports that over the past 10 days she has had 3 episodes of fainting. [place] and she started feeling dizzy, sweating and the next thing she knows is that she was in the ER. Pt had no chest pain, dib, palpitations. Pt has no hx of PE, DVT and denies any exogenous hormone (estrogen) use, long distance travels or surgery in the past 6 weeks, active cancer, recent immobilization. Pt is s/p chemo for the throat CA. Pt denies any drug use and she has been taking her meds as prescribed.   Past Medical History:  Diagnosis Date  . 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 Cancer (HCC) throat  . COPD (chronic obstructive pulmonary disease) ([place])   . Coronary artery disease   . Diabetes mellitus without complication ([place])   . Hypertension   . Migraine   . Obesity   . Schizophrenia ([place])   . Seizures ([place])   . Sleep apnea   . Throat cancer [facility])     Patient Active Problem List   Diagnosis Date Noted  . Mastitis in female 07/05/2012  . Dehydration 07/05/2012  . Seizure disorder ([place]) 07/05/2012  . Chest pain 05/04/2012  . COPD (chronic obstructive pulmonary disease) ([place]) 05/04/2012  . Diabetes mellitus, type 2 ([place]) 05/04/2012  . Bipolar disorder ([place]) 05/04/2012    Past Surgical History:  Procedure Laterality Date  . ABDOMINAL HYSTERECTOMY    . APPENDECTOMY    . CESAREAN SECTION    . CHOLECYSTECTOMY    . EYE SURGERY    . KNEE SURGERY      OB History    No data available       Home Medications    Prior to Admission medications   Medication Sig Start Date End Date Taking? Authorizing Provider  albuterol (PROVENTIL  HFA;VENTOLIN HFA) 108 (90 Base) MCG/ACT inhaler Inhale 2 puffs into the lungs every 6 (six) hours as needed for wheezing or shortness of breath. Patient not taking: Reported on 10/02/2016 05/11/16   Rudene Re, MD  cephALEXin (KEFLEX) 500 MG capsule Take 1 capsule (500 mg total) by mouth 3 (three) times daily. Patient not taking: Reported on 09/08/2016 07/16/16   Waynetta Pean, PA-C  divalproex (DEPAKOTE ER) 500 MG 24 hr tablet Take 1 tablet (500 mg total) by mouth 2 (two) times daily. Patient not taking: Reported on 09/08/2016 05/11/16   Rudene Re, MD  doxycycline (VIBRAMYCIN) 100 MG capsule Take 1 capsule (100 mg total) by mouth 2 (two) times daily. Patient not taking: Reported on 10/02/2016 09/08/16   Ocie Cornfield T, PA-C  insulin aspart protamine- aspart (NOVOLOG MIX 70/30) (70-30) 100 UNIT/ML injection Inject 0.1 mLs (10 Units total) into the skin 2 (two) times daily with a meal. Reported on 04/01/2015 Patient not taking: Reported on 03/07/2016 06/17/15   Domenic Moras, PA-C  lisinopril (PRINIVIL,ZESTRIL) 20 MG tablet Take 1 tablet (20 mg total) by mouth daily. Patient not taking: Reported on 09/08/2016 05/11/16 05/11/17  Rudene Re, MD  metFORMIN (GLUCOPHAGE) 500 MG tablet Take 2 tablets (1,000 mg total) by mouth 2 (  two) times daily with a meal. Patient not taking: Reported on 09/08/2016 05/11/16   Rudene Re, MD  metroNIDAZOLE (FLAGYL) 500 MG tablet Take 1 tablet (500 mg total) by mouth 2 (two) times daily. One tab PO bid x 10 days 10/10/16   Dalia Heading, PA-C  nitroGLYCERIN (NITROSTAT) 0.4 MG SL tablet Place 1 tablet (0.4 mg total) under the tongue every 5 (five) minutes as needed for chest pain. 05/11/16 05/11/17  Rudene Re, MD  ondansetron (ZOFRAN) 4 MG tablet Take 1 tablet (4 mg total) by mouth every 8 (eight) hours as needed for nausea or vomiting. Patient not taking: Reported on 09/08/2016 05/11/16   Rudene Re, MD    Family History Family  History  Problem Relation Age of Onset  . Cancer Father     Social History Social History  Substance Use Topics  . Smoking status: Current Every Day Smoker    Packs/day: 0.50    Years: 30.00    Types: Cigarettes  . Smokeless tobacco: Never Used  . Alcohol use No     Allergies   Aspirin; Bee venom; Other; Naproxen; Toradol [ketorolac tromethamine]; Penicillins; Tramadol; and Vancomycin   Review of Systems Review of Systems  Constitutional: Positive for fatigue. Negative for activity change.  Respiratory: Negative for chest tightness and shortness of breath.   Cardiovascular: Negative for chest pain and palpitations.  Gastrointestinal: Negative for abdominal pain, nausea and vomiting.  Genitourinary: Negative for dysuria.  Musculoskeletal: Negative for neck pain.  Neurological: Positive for dizziness, syncope and light-headedness. Negative for seizures, facial asymmetry, numbness and headaches.     Physical Exam Updated Vital Signs BP (!) 142/98 (BP Location: Right Arm)   Pulse 84   Temp 98 F (36.7 C) (Oral)   Resp 14   Ht 5\' 2"  (1.575 m)   Wt 83.5 kg (184 lb)   SpO2 94%   BMI 33.65 kg/m   Physical Exam  Constitutional: She is oriented to person, place, and time. She appears well-developed.  HENT:  Head: Normocephalic and atraumatic.  Eyes: EOM are normal.  Neck: Normal range of motion. Neck supple.  Cardiovascular: Normal rate.   Pulmonary/Chest: Effort normal and breath sounds normal. She has no wheezes.  Abdominal: Bowel sounds are normal.  Musculoskeletal: She exhibits no tenderness.  Neurological: She is alert and oriented to person, place, and time.  Skin: Skin is warm and dry.  Nursing note and vitals reviewed.    ED Treatments / Results  Labs (all labs ordered are listed, but only abnormal results are displayed) Labs Reviewed  BASIC METABOLIC PANEL  CBC WITH DIFFERENTIAL/PLATELET  D-DIMER, QUANTITATIVE (NOT AT [facility])  URINALYSIS, ROUTINE W  REFLEX MICROSCOPIC  CBG MONITORING, ED    EKG  EKG Interpretation  Date/Time:  Saturday November 28 2016 22:38:44 EDT Ventricular Rate:  80 PR Interval:    QRS Duration: 96 QT Interval:  390 QTC Calculation: 450 R Axis:   91 Text Interpretation:  Sinus rhythm Ventricular premature complex Borderline right axis deviation Abnormal ekg Confirmed by Carmin Muskrat [phone]) on 11/28/2016 10:45:45 PM       Radiology No results found.  Procedures Procedures (including critical care time)  Medications Ordered in ED Medications  sodium chloride 0.9 % bolus 1,000 mL (not administered)     Initial Impression / Assessment and Plan / ED Course  I have reviewed the triage vital signs and the nursing notes.  Pertinent labs & imaging results that were available during my care of the patient  were reviewed by me and considered in my medical decision making (see chart for details).     DDx includes: Orthostatic hypotension Stroke Vertebral artery dissection/stenosis Dysrhythmia PE Vasovagal/neurocardiogenic syncope Aortic stenosis Valvular disorder/Cardiomyopathy Anemia  PT comes in with cc of syncope. She has CAD, no CHF hx. She is not very clear about her CAd, reporting that she had a heart attack > 10 years ago, but she is unsure if she had a stent placement. Pt has a prodrome of dizziness, diophoresis prior to her syncope, so it is possible that her symptoms are due to vasovagal response. Pt denies any chest pain, dib, palpitations - which is reassuring. Exam reveals no gross  Fluid overload. Given ? Throat CA hx, and this being 3rd episode this month, we will get dimer and monitor over tele. Orthostatics also added. If workup is neg, I feel comfortable discharging the patient, but she will need to see Cards for possible echo and holder. Pt agrees with the plan.  Final Clinical Impressions(s) / ED Diagnoses   Final diagnoses:  Syncope and collapse    New  Prescriptions New Prescriptions   No medications on file     Varney Biles, MD 11/29/16 [phone]

## 2016-11-29 NOTE — Discharge Instructions (Signed)
All the results in the ER are normal, labs and imaging. We are not sure what is causing your symptoms. The workup in the ER is not complete, and is limited to screening for life threatening and emergent conditions only, so please see a primary care doctor for further evaluation.  

## 2016-12-12 LAB — GLUCOSE, POCT (MANUAL RESULT ENTRY): POC GLUCOSE: 280 mg/dL — AB (ref 70–99)

## 2016-12-30 ENCOUNTER — Encounter (HOSPITAL_COMMUNITY): Payer: Self-pay | Admitting: Emergency Medicine

## 2016-12-30 ENCOUNTER — Emergency Department (HOSPITAL_COMMUNITY)
Admission: EM | Admit: 2016-12-30 | Discharge: 2016-12-31 | Disposition: A | Discharge status: Home / Self Care | Payer: Medicaid Other | Attending: Emergency Medicine | Admitting: Emergency Medicine

## 2016-12-30 DIAGNOSIS — R109 Unspecified abdominal pain: Secondary | ICD-10-CM | POA: Diagnosis present

## 2016-12-30 DIAGNOSIS — Z5321 Procedure and treatment not carried out due to patient leaving prior to being seen by health care provider: Secondary | ICD-10-CM | POA: Insufficient documentation

## 2016-12-30 LAB — I-STAT BETA HCG BLOOD, ED (MC, WL, AP ONLY): I-stat hCG, quantitative: <5 m[IU]/mL (ref <5)

## 2016-12-30 LAB — CBC
HEMATOCRIT: 42.2 % (ref 36.0–46.0)
HEMOGLOBIN: 14.2 g/dL (ref 12.0–15.0)
MCH: 30.7 pg (ref 26.0–34.0)
MCHC: 33.6 g/dL (ref 30.0–36.0)
MCV: 91.3 fL (ref 78.0–100.0)
Platelets: 255 10*3/uL (ref 150–400)
RBC: 4.62 MIL/uL (ref 3.87–5.11)
RDW: 13.4 % (ref 11.5–15.5)
WBC: 10 10*3/uL (ref 4.0–10.5)

## 2016-12-30 LAB — URINALYSIS, ROUTINE W REFLEX MICROSCOPIC
BILIRUBIN URINE: NEGATIVE
GLUCOSE, UA: NEGATIVE mg/dL
HGB URINE DIPSTICK: NEGATIVE
KETONES UR: NEGATIVE mg/dL
Leukocytes, UA: NEGATIVE
Nitrite: NEGATIVE
PROTEIN: NEGATIVE mg/dL
SPECIFIC GRAVITY, URINE: 1.014 (ref 1.005–1.030)
pH: 6 (ref 5.0–8.0)

## 2016-12-30 LAB — COMPREHENSIVE METABOLIC PANEL
ALBUMIN: 3.7 g/dL (ref 3.5–5.0)
ALK PHOS: 88 U/L (ref 38–126)
ALT: 10 U/L — ABNORMAL LOW (ref 14–54)
AST: 18 U/L (ref 15–41)
Anion gap: 9 (ref 5–15)
BILIRUBIN TOTAL: 0.4 mg/dL (ref 0.3–1.2)
BUN: 8 mg/dL (ref 6–20)
CALCIUM: 9.3 mg/dL (ref 8.9–10.3)
CO2: 25 mmol/L (ref 22–32)
Chloride: 104 mmol/L (ref 101–111)
Creatinine, Ser: 0.75 mg/dL (ref 0.44–1.00)
GFR calc Af Amer: >60 mL/min (ref >60)
GFR calc non Af Amer: >60 mL/min (ref >60)
GLUCOSE: 115 mg/dL — AB (ref 65–99)
Potassium: 3.8 mmol/L (ref 3.5–5.1)
SODIUM: 138 mmol/L (ref 135–145)
TOTAL PROTEIN: 6.7 g/dL (ref 6.5–8.1)

## 2016-12-30 LAB — LIPASE, BLOOD: Lipase: 32 U/L (ref 11–51)

## 2016-12-30 MED ORDER — ONDANSETRON 4 MG PO TBDP
ORAL_TABLET | ORAL | Status: AC
  Filled 2016-12-30: qty 1

## 2016-12-30 MED ORDER — ONDANSETRON 4 MG PO TBDP
4.0000 mg | ORAL_TABLET | Freq: Once | ORAL | Status: AC | PRN
  Administered 2016-12-30: 4 mg via ORAL

## 2016-12-30 NOTE — ED Notes (Signed)
Second call in lobby for vitals update. No response. 

## 2016-12-30 NOTE — ED Triage Notes (Signed)
Pt presents to ED for assessment of 3 hours of abdominal pain, nausea (x5) and diarrhea (x10).  Pt denies known exposure, denies changes in urination.  Pt tearful at triage.

## 2016-12-30 NOTE — ED Notes (Signed)
Pt called in lobby for vitals update. No response.

## 2017-01-10 ENCOUNTER — Encounter (HOSPITAL_COMMUNITY): Payer: Self-pay | Admitting: *Deleted

## 2017-01-10 ENCOUNTER — Emergency Department (HOSPITAL_COMMUNITY)
Admission: EM | Admit: 2017-01-10 | Discharge: 2017-01-10 | Disposition: A | Discharge status: Home / Self Care | Payer: Medicaid Other | Attending: Emergency Medicine | Admitting: Emergency Medicine

## 2017-01-10 DIAGNOSIS — H9201 Otalgia, right ear: Secondary | ICD-10-CM | POA: Diagnosis present

## 2017-01-10 DIAGNOSIS — F319 Bipolar disorder, unspecified: Secondary | ICD-10-CM | POA: Diagnosis not present

## 2017-01-10 DIAGNOSIS — Z85818 Personal history of malignant neoplasm of other sites of lip, oral cavity, and pharynx: Secondary | ICD-10-CM | POA: Insufficient documentation

## 2017-01-10 DIAGNOSIS — F17210 Nicotine dependence, cigarettes, uncomplicated: Secondary | ICD-10-CM | POA: Diagnosis not present

## 2017-01-10 DIAGNOSIS — E119 Type 2 diabetes mellitus without complications: Secondary | ICD-10-CM | POA: Diagnosis not present

## 2017-01-10 DIAGNOSIS — I10 Essential (primary) hypertension: Secondary | ICD-10-CM | POA: Insufficient documentation

## 2017-01-10 DIAGNOSIS — Z9049 Acquired absence of other specified parts of digestive tract: Secondary | ICD-10-CM | POA: Diagnosis not present

## 2017-01-10 DIAGNOSIS — I2510 Atherosclerotic heart disease of native coronary artery without angina pectoris: Secondary | ICD-10-CM | POA: Diagnosis not present

## 2017-01-10 DIAGNOSIS — F209 Schizophrenia, unspecified: Secondary | ICD-10-CM | POA: Diagnosis not present

## 2017-01-10 DIAGNOSIS — J449 Chronic obstructive pulmonary disease, unspecified: Secondary | ICD-10-CM | POA: Diagnosis not present

## 2017-01-10 LAB — CBG MONITORING, ED: GLUCOSE-CAPILLARY: 172 mg/dL — AB (ref 65–99)

## 2017-01-10 MED ORDER — NEOMYCIN-COLIST-HC-THONZONIUM 3.3-3-10-0.5 MG/ML OT SUSP
3.0000 [drp] | Freq: Four times a day (QID) | OTIC | Status: DC
  Filled 2017-01-10: qty 10

## 2017-01-10 MED ORDER — NEOMYCIN-POLYMYXIN-HC 3.5-10000-1 OT SUSP
3.0000 [drp] | Freq: Three times a day (TID) | OTIC | 0 refills | Status: AC
Start: 2017-01-10 — End: ?

## 2017-01-10 MED ORDER — FLUTICASONE PROPIONATE 50 MCG/ACT NA SUSP: 2.0000 | Freq: Every day | NASAL | 2 refills | Status: AC

## 2017-01-10 MED ORDER — ACETAMINOPHEN 325 MG PO TABS
650.0000 mg | ORAL_TABLET | Freq: Once | ORAL | Status: AC
  Administered 2017-01-10: 650 mg via ORAL
  Filled 2017-01-10: qty 2

## 2017-01-10 MED ORDER — NEOMYCIN-POLYMYXIN-HC 3.5-10000-1 OT SUSP
3.0000 [drp] | Freq: Four times a day (QID) | OTIC | Status: DC
  Administered 2017-01-10: 3 [drp] via OTIC
  Filled 2017-01-10: qty 10

## 2017-01-10 MED ORDER — ACETAMINOPHEN 500 MG PO TABS: 500.0000 mg | ORAL_TABLET | Freq: Four times a day (QID) | ORAL | 0 refills | Status: AC | PRN

## 2017-01-10 NOTE — ED Triage Notes (Signed)
Pt states R ear pain since yesterday. States pain with swallowing.

## 2017-01-10 NOTE — ED Provider Notes (Signed)
[facility] EMERGENCY DEPARTMENT Provider Note   CSN: [id number] Arrival date & time: 01/10/17  1605     History   Chief Complaint Chief Complaint  Patient presents with  . Otalgia    HPI Carol Hughes is a 51 y.o. female.  HPI  This is a 51 year old female with a history of COPD, coronary artery disease, diabetes, hypertension, schizophrenia who presents with right otalgia. Patient reports 2 day history of worsening right-sided ear pain. She states that it started yesterday. She has had some upper respiratory congestion. No fevers. She states that when she presses on her right ear she has a shooting pain through her brain. Currently her pain is 10 out of 10. She took Tylenol with some relief. Denies any sore throat, cough, other upper respiratory symptoms.  Past Medical History:  Diagnosis Date  . Addiction to drug ([place])   . Cancer (HCC) throat  . COPD (chronic obstructive pulmonary disease) ([place])   . Coronary artery disease   . Diabetes mellitus without complication ([place])   . Hypertension   . Migraine   . Obesity   . Schizophrenia ([place])   . Seizures ([place])   . Sleep apnea   . Throat cancer [facility])     Patient Active Problem List   Diagnosis Date Noted  . Mastitis in female 07/05/2012  . Dehydration 07/05/2012  . Seizure disorder ([place]) 07/05/2012  . Chest pain 05/04/2012  . COPD (chronic obstructive pulmonary disease) ([place]) 05/04/2012  . Diabetes mellitus, type 2 ([place]) 05/04/2012  . Bipolar disorder ([place]) 05/04/2012    Past Surgical History:  Procedure Laterality Date  . ABDOMINAL HYSTERECTOMY    . APPENDECTOMY    . CESAREAN SECTION    . CHOLECYSTECTOMY    . EYE SURGERY    . KNEE SURGERY      OB History    No data available       Home Medications    Prior to Admission medications   Medication Sig Start Date End Date Taking? Authorizing Provider  acetaminophen (TYLENOL) 500 MG tablet Take 1 tablet (500 mg total) by mouth every 6  (six) hours as needed. 01/10/17   Horton, Barbette Hair, MD  albuterol (PROVENTIL HFA;VENTOLIN HFA) 108 (90 Base) MCG/ACT inhaler Inhale 2 puffs into the lungs every 6 (six) hours as needed for wheezing or shortness of breath. Patient not taking: Reported on 10/02/2016 05/11/16   Rudene Re, MD  cephALEXin (KEFLEX) 500 MG capsule Take 1 capsule (500 mg total) by mouth 3 (three) times daily. Patient not taking: Reported on 09/08/2016 07/16/16   Waynetta Pean, PA-C  divalproex (DEPAKOTE ER) 500 MG 24 hr tablet Take 1 tablet (500 mg total) by mouth 2 (two) times daily. Patient not taking: Reported on 09/08/2016 05/11/16   Rudene Re, MD  doxycycline (VIBRAMYCIN) 100 MG capsule Take 1 capsule (100 mg total) by mouth 2 (two) times daily. Patient not taking: Reported on 10/02/2016 09/08/16   Ocie Cornfield T, PA-C  fluticasone [facility]) 50 MCG/ACT nasal spray Place 2 sprays into both nostrils daily. 01/10/17   Horton, Barbette Hair, MD  insulin aspart protamine- aspart (NOVOLOG MIX 70/30) (70-30) 100 UNIT/ML injection Inject 0.1 mLs (10 Units total) into the skin 2 (two) times daily with a meal. Reported on 04/01/2015 Patient not taking: Reported on 03/07/2016 06/17/15   Domenic Moras, PA-C  lisinopril (PRINIVIL,ZESTRIL) 20 MG tablet Take 1 tablet (20 mg total) by mouth daily. Patient not taking: Reported on 09/08/2016 05/11/16 05/11/17  Alfred Levins, [place], MD  metFORMIN (GLUCOPHAGE) 500 MG tablet Take 2 tablets (1,000 mg total) by mouth 2 (two) times daily with a meal. Patient not taking: Reported on 09/08/2016 05/11/16   Rudene Re, MD  metroNIDAZOLE (FLAGYL) 500 MG tablet Take 1 tablet (500 mg total) by mouth 2 (two) times daily. One tab PO bid x 10 days 10/10/16   Dalia Heading, PA-C  neomycin-polymyxin-hydrocortisone (CORTISPORIN) 3.5-10000-1 OTIC suspension Place 3 drops into the right ear 3 (three) times daily. 01/10/17   Horton, Barbette Hair, MD  nitroGLYCERIN (NITROSTAT) 0.4 MG SL tablet  Place 1 tablet (0.4 mg total) under the tongue every 5 (five) minutes as needed for chest pain. 05/11/16 05/11/17  Rudene Re, MD  ondansetron (ZOFRAN) 4 MG tablet Take 1 tablet (4 mg total) by mouth every 8 (eight) hours as needed for nausea or vomiting. Patient not taking: Reported on 09/08/2016 05/11/16   Rudene Re, MD    Family History Family History  Problem Relation Age of Onset  . Cancer Father     Social History Social History  Substance Use Topics  . Smoking status: Current Every Day Smoker    Packs/day: 0.50    Years: 30.00    Types: Cigarettes  . Smokeless tobacco: Never Used  . Alcohol use No     Allergies   Aspirin; Bee venom; Other; Naproxen; Toradol [ketorolac tromethamine]; Penicillins; Tramadol; and Vancomycin   Review of Systems Review of Systems  Constitutional: Negative for fever.  HENT: Positive for congestion and ear pain. Negative for facial swelling, hearing loss, sore throat and trouble swallowing.   Respiratory: Negative for shortness of breath.   Cardiovascular: Negative for chest pain.  Gastrointestinal: Negative for abdominal pain, nausea and vomiting.  All other systems reviewed and are negative.    Physical Exam Updated Vital Signs BP 129/79 (BP Location: Right Arm)   Pulse 69   Temp 98 F (36.7 C) (Oral)   Resp 18   Ht 5\' 2"  (1.575 m)   Wt 86.2 kg (190 lb)   SpO2 97%   BMI 34.75 kg/m   Physical Exam  Constitutional: She is oriented to person, place, and time.  Generally disheveled appearing, no acute distress  HENT:  Head: Normocephalic and atraumatic.  Mouth/Throat: No oropharyngeal exudate.  Tenderness with pulling of the pinna of the right ear, mild canal swelling noted, TM full but light reflexes intact, no significant erythema or bulging Left TM normal  Eyes: Pupils are equal, round, and reactive to light.  Neck: Neck supple.  Cardiovascular: Normal rate and regular rhythm.   Pulmonary/Chest: Effort  normal. No respiratory distress.  Abdominal: Soft. There is no tenderness.  Neurological: She is alert and oriented to person, place, and time.  Skin: Skin is warm and dry.  Psychiatric: She has a normal mood and affect.  Nursing note and vitals reviewed.    ED Treatments / Results  Labs (all labs ordered are listed, but only abnormal results are displayed) Labs Reviewed  CBG MONITORING, ED - Abnormal; Notable for the following:       Result Value   Glucose-Capillary 172 (*)    All other components within normal limits    EKG  EKG Interpretation None       Radiology No results found.  Procedures Procedures (including critical care time)  Medications Ordered in ED Medications  neomycin-colistin-hydrocortisone-thonzonium (CORTISPORIN TC) OTIC (EAR) suspension 3 drop (not administered)  acetaminophen (TYLENOL) tablet 650 mg (not administered)     Initial  Impression / Assessment and Plan / ED Course  I have reviewed the triage vital signs and the nursing notes.  Pertinent labs & imaging results that were available during my care of the patient were reviewed by me and considered in my medical decision making (see chart for details).     Patient presents with ear pain. She is nontoxic-appearing on exam. Vital signs reassuring. Given pain with external manipulation of the pinna and mild swelling of the canal, patient may be developing otitis externa. She also reports nasal congestion and has some fullness behind the TM but no obvious evidence of otitis media. Will elect to give her Cortisporin. Recommend Flonase and nasal saline as well. Tylenol as needed for pain.  After history, exam, and medical workup I feel the patient has been appropriately medically screened and is safe for discharge home. Pertinent diagnoses were discussed with the patient. Patient was given return precautions.   Final Clinical Impressions(s) / ED Diagnoses   Final diagnoses:  Right ear pain     New Prescriptions New Prescriptions   ACETAMINOPHEN (TYLENOL) 500 MG TABLET    Take 1 tablet (500 mg total) by mouth every 6 (six) hours as needed.   FLUTICASONE (FLONASE) 50 MCG/ACT NASAL SPRAY    Place 2 sprays into both nostrils daily.   NEOMYCIN-POLYMYXIN-HYDROCORTISONE (CORTISPORIN) 3.5-10000-1 OTIC SUSPENSION    Place 3 drops into the right ear 3 (three) times daily.     Merryl Hacker, MD 01/10/17 1750

## 2017-01-10 NOTE — ED Notes (Signed)
ED Provider at bedside. 

## 2017-01-10 NOTE — ED Notes (Signed)
Acuity 3 for hx only.

## 2017-02-07 ENCOUNTER — Emergency Department (HOSPITAL_COMMUNITY)
Admission: EM | Admit: 2017-02-07 | Discharge: 2017-02-07 | Disposition: A | Discharge status: Home / Self Care | Payer: Medicaid Other | Attending: Emergency Medicine | Admitting: Emergency Medicine

## 2017-02-07 ENCOUNTER — Encounter (HOSPITAL_COMMUNITY): Payer: Self-pay

## 2017-02-07 DIAGNOSIS — Z5321 Procedure and treatment not carried out due to patient leaving prior to being seen by health care provider: Secondary | ICD-10-CM | POA: Diagnosis not present

## 2017-02-07 DIAGNOSIS — N898 Other specified noninflammatory disorders of vagina: Secondary | ICD-10-CM | POA: Diagnosis present

## 2017-02-07 LAB — CBG MONITORING, ED: GLUCOSE-CAPILLARY: 189 mg/dL — AB (ref 65–99)

## 2017-02-07 NOTE — ED Triage Notes (Signed)
Per Pt, Pt is coming from home with complaints of yellow vaginal discharge and pelvic pain. Denies urinary symptoms. Complains of pain with intercourse.

## 2017-02-07 NOTE — ED Notes (Signed)
Pt stating she was leaving, RN encouraged her to stay and wait for the doctor to see her but pt stated she was diabetic and needed to go eat, Rn informed pt that we took her CBG in triage and it was fine but pt still insisted on leaving.

## 2017-02-07 NOTE — ED Notes (Signed)
Pt states she feels her blood sugar is dropping.  CBG reads 189.  She states it is normally in the 600's and her pcp told her to eat something if her blood sugars decrease to 200's.  Offered juice or food and pt declined all, stating she just wanted to be taken back to a room.

## 2017-02-07 NOTE — ED Notes (Signed)
Pt stated that she vomited, while outside. Pt asked that her blood sugar be tested. Informed Hassan Rowan - RN.

## 2017-02-07 NOTE — ED Notes (Signed)
Pt stepping outside again.
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
## 2017-02-07 NOTE — ED Notes (Signed)
Pt very agitated about the 2 hour wait. Pt's vitals will be reassessed.

## 2017-02-07 NOTE — ED Notes (Signed)
Pt stated that she needed to step outside for some air. Pt will return soon.

## 2017-02-07 NOTE — ED Notes (Signed)
Pt's CBG result was 189. Informed Hassan Rowan - RN.

## 2017-02-07 NOTE — ED Provider Notes (Cosign Needed)
Carol Hughes is a 51 y.o. female with a PMHx of COPD, HTN, DM2, schizophrenia, drug addiction, CAD, migraines, seizures, remote throat cancer, and other conditions listed below, with a PSHx of abd hysterectomy with b/l oophorectomy, cholecystectomy, and appendectomy, who presented to the ED with reported complaints of vaginal discharge and itching that began today, per nursing staff notes. After pt was roomed, a short while later I assigned myself to her and went to see her, however no one was in the room and no patient belongings were present; I waited about 15 minutes to see if she would return, but she was still not there; I asked the tech where she might be and the tech informed me that the pt had left because "we were taking too long". Therefore the pt was not seen by me nor any other EDP, as she left without being seen after triage.   Of note, her last ED visit for vaginal discharge was on 10/02/16 when she had neg GC/CT testing, RPR/HIV neg, wet prep with +trich but otherwise negative, she had transvaginal/pelvic U/S which revealed b/l oophorectomy and hysterectomy with no acute findings in the pelvis. She has been seen for STD concerns/vaginal complaints multiple times in the past as well.   Her only lab test here was a CBG which was 189. I do not feel we need to call the patient back to have her further evaluated today, unless she decides to come back on her own accord.   Again, PT WAS NOT SEEN BY EDP, SHE ELOPED AFTER BEING ROOMED, PRIOR TO EVALUATION, AFTER TRIAGE.    [address], [place], [place] 02/07/17 1600
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
## 2017-02-26 ENCOUNTER — Emergency Department
Admission: EM | Admit: 2017-02-26 | Discharge: 2017-02-26 | Disposition: A | Discharge status: Home / Self Care | Payer: Medicaid Other | Attending: Emergency Medicine | Admitting: Emergency Medicine

## 2017-02-26 ENCOUNTER — Other Ambulatory Visit: Payer: Self-pay

## 2017-02-26 ENCOUNTER — Emergency Department: Payer: Medicaid Other

## 2017-02-26 DIAGNOSIS — Z79899 Other long term (current) drug therapy: Secondary | ICD-10-CM | POA: Insufficient documentation

## 2017-02-26 DIAGNOSIS — J449 Chronic obstructive pulmonary disease, unspecified: Secondary | ICD-10-CM | POA: Insufficient documentation

## 2017-02-26 DIAGNOSIS — F17210 Nicotine dependence, cigarettes, uncomplicated: Secondary | ICD-10-CM | POA: Insufficient documentation

## 2017-02-26 DIAGNOSIS — K649 Unspecified hemorrhoids: Secondary | ICD-10-CM | POA: Diagnosis not present

## 2017-02-26 DIAGNOSIS — Z794 Long term (current) use of insulin: Secondary | ICD-10-CM | POA: Diagnosis not present

## 2017-02-26 DIAGNOSIS — R1084 Generalized abdominal pain: Secondary | ICD-10-CM | POA: Diagnosis not present

## 2017-02-26 DIAGNOSIS — K625 Hemorrhage of anus and rectum: Secondary | ICD-10-CM | POA: Diagnosis present

## 2017-02-26 DIAGNOSIS — E119 Type 2 diabetes mellitus without complications: Secondary | ICD-10-CM | POA: Insufficient documentation

## 2017-02-26 DIAGNOSIS — F419 Anxiety disorder, unspecified: Secondary | ICD-10-CM | POA: Insufficient documentation

## 2017-02-26 DIAGNOSIS — I10 Essential (primary) hypertension: Secondary | ICD-10-CM | POA: Insufficient documentation

## 2017-02-26 LAB — CBC WITH DIFFERENTIAL/PLATELET
BASOS PCT: 0 %
Basophils Absolute: 0 10*3/uL (ref 0–0.1)
EOS ABS: 0.1 10*3/uL (ref 0–0.7)
Eosinophils Relative: 1 %
HCT: 39.6 % (ref 35.0–47.0)
HEMOGLOBIN: 13.4 g/dL (ref 12.0–16.0)
LYMPHS ABS: 2.9 10*3/uL (ref 1.0–3.6)
Lymphocytes Relative: 40 %
MCH: 31 pg (ref 26.0–34.0)
MCHC: 34 g/dL (ref 32.0–36.0)
MCV: 91.1 fL (ref 80.0–100.0)
MONOS PCT: 7 %
Monocytes Absolute: 0.5 10*3/uL (ref 0.2–0.9)
NEUTROS PCT: 52 %
Neutro Abs: 3.7 10*3/uL (ref 1.4–6.5)
Platelets: 230 10*3/uL (ref 150–440)
RBC: 4.34 MIL/uL (ref 3.80–5.20)
RDW: 13.1 % (ref 11.5–14.5)
WBC: 7.3 10*3/uL (ref 3.6–11.0)

## 2017-02-26 LAB — PROTIME-INR
INR: 0.87
PROTHROMBIN TIME: 11.7 s (ref 11.4–15.2)

## 2017-02-26 LAB — URINALYSIS, COMPLETE (UACMP) WITH MICROSCOPIC
BILIRUBIN URINE: NEGATIVE
Bacteria, UA: NONE SEEN
GLUCOSE, UA: NEGATIVE mg/dL
KETONES UR: NEGATIVE mg/dL
LEUKOCYTES UA: NEGATIVE
NITRITE: NEGATIVE
PH: 7 (ref 5.0–8.0)
Protein, ur: NEGATIVE mg/dL
Specific Gravity, Urine: 1.009 (ref 1.005–1.030)

## 2017-02-26 LAB — COMPREHENSIVE METABOLIC PANEL
ALK PHOS: 104 U/L (ref 38–126)
ALT: 9 U/L — AB (ref 14–54)
ANION GAP: 8 (ref 5–15)
AST: 19 U/L (ref 15–41)
Albumin: 3.7 g/dL (ref 3.5–5.0)
BILIRUBIN TOTAL: 0.4 mg/dL (ref 0.3–1.2)
BUN: 16 mg/dL (ref 6–20)
CALCIUM: 9.7 mg/dL (ref 8.9–10.3)
CO2: 30 mmol/L (ref 22–32)
CREATININE: 0.65 mg/dL (ref 0.44–1.00)
Chloride: 99 mmol/L — ABNORMAL LOW (ref 101–111)
GFR calc non Af Amer: >60 mL/min (ref >60)
GLUCOSE: 174 mg/dL — AB (ref 65–99)
Potassium: 4.7 mmol/L (ref 3.5–5.1)
Sodium: 137 mmol/L (ref 135–145)
TOTAL PROTEIN: 6.9 g/dL (ref 6.5–8.1)

## 2017-02-26 LAB — APTT: APTT: 29 s (ref 24–36)

## 2017-02-26 LAB — ETHANOL: Alcohol, Ethyl (B): <10 mg/dL (ref <10)

## 2017-02-26 LAB — LIPASE, BLOOD: LIPASE: 33 U/L (ref 11–51)

## 2017-02-26 MED ORDER — HYDROMORPHONE HCL 1 MG/ML IJ SOLN
1.0000 mg | Freq: Once | INTRAMUSCULAR | Status: AC
  Administered 2017-02-26: 1 mg via INTRAVENOUS
  Filled 2017-02-26: qty 1

## 2017-02-26 MED ORDER — ONDANSETRON HCL 4 MG/2ML IJ SOLN
4.0000 mg | Freq: Once | INTRAMUSCULAR | Status: AC
  Administered 2017-02-26: 4 mg via INTRAVENOUS

## 2017-02-26 MED ORDER — IOPAMIDOL (ISOVUE-300) INJECTION 61%
100.0000 mL | Freq: Once | INTRAVENOUS | Status: AC | PRN
  Administered 2017-02-26: 100 mL via INTRAVENOUS

## 2017-02-26 MED ORDER — IOPAMIDOL (ISOVUE-300) INJECTION 61%
30.0000 mL | Freq: Once | INTRAVENOUS | Status: AC | PRN
  Administered 2017-02-26: 30 mL via ORAL

## 2017-02-26 MED ORDER — ONDANSETRON HCL 4 MG/2ML IJ SOLN
INTRAMUSCULAR | Status: AC
  Administered 2017-02-26: 4 mg via INTRAVENOUS
  Filled 2017-02-26: qty 2

## 2017-02-26 MED ORDER — PRAMOXINE HCL 1 % RE FOAM: 1.0000 "application " | Freq: Three times a day (TID) | RECTAL | 0 refills | Status: AC | PRN

## 2017-02-26 MED ORDER — SENNOSIDES-DOCUSATE SODIUM 8.6-50 MG PO TABS: 2.0000 | ORAL_TABLET | Freq: Two times a day (BID) | ORAL | 0 refills | Status: AC

## 2017-02-26 MED ORDER — SODIUM CHLORIDE 0.9 % IV BOLUS (SEPSIS)
1000.0000 mL | Freq: Once | INTRAVENOUS | Status: AC
  Administered 2017-02-26: 1000 mL via INTRAVENOUS

## 2017-02-26 MED ORDER — LORAZEPAM 2 MG/ML IJ SOLN
1.0000 mg | Freq: Once | INTRAMUSCULAR | Status: AC
  Administered 2017-02-26: 1 mg via INTRAVENOUS
  Filled 2017-02-26: qty 1
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
## 2017-03-04 ENCOUNTER — Emergency Department
Admission: EM | Admit: 2017-03-04 | Discharge: 2017-03-04 | Disposition: A | Discharge status: Home / Self Care | Payer: Medicaid Other | Attending: Emergency Medicine | Admitting: Emergency Medicine

## 2017-03-04 ENCOUNTER — Other Ambulatory Visit: Payer: Self-pay

## 2017-03-04 ENCOUNTER — Encounter: Payer: Self-pay | Admitting: *Deleted

## 2017-03-04 DIAGNOSIS — Z794 Long term (current) use of insulin: Secondary | ICD-10-CM | POA: Diagnosis not present

## 2017-03-04 DIAGNOSIS — I10 Essential (primary) hypertension: Secondary | ICD-10-CM | POA: Insufficient documentation

## 2017-03-04 DIAGNOSIS — F17210 Nicotine dependence, cigarettes, uncomplicated: Secondary | ICD-10-CM | POA: Diagnosis not present

## 2017-03-04 DIAGNOSIS — I2510 Atherosclerotic heart disease of native coronary artery without angina pectoris: Secondary | ICD-10-CM | POA: Diagnosis not present

## 2017-03-04 DIAGNOSIS — J449 Chronic obstructive pulmonary disease, unspecified: Secondary | ICD-10-CM | POA: Diagnosis not present

## 2017-03-04 DIAGNOSIS — Z79899 Other long term (current) drug therapy: Secondary | ICD-10-CM | POA: Insufficient documentation

## 2017-03-04 DIAGNOSIS — F419 Anxiety disorder, unspecified: Secondary | ICD-10-CM | POA: Diagnosis present

## 2017-03-04 DIAGNOSIS — E138 Other specified diabetes mellitus with unspecified complications: Secondary | ICD-10-CM

## 2017-03-04 DIAGNOSIS — F4310 Post-traumatic stress disorder, unspecified: Secondary | ICD-10-CM | POA: Insufficient documentation

## 2017-03-04 DIAGNOSIS — E1165 Type 2 diabetes mellitus with hyperglycemia: Secondary | ICD-10-CM | POA: Insufficient documentation

## 2017-03-04 LAB — COMPREHENSIVE METABOLIC PANEL
ALBUMIN: 3.7 g/dL (ref 3.5–5.0)
ALT: 9 U/L — AB (ref 14–54)
AST: 22 U/L (ref 15–41)
Alkaline Phosphatase: 111 U/L (ref 38–126)
Anion gap: 8 (ref 5–15)
BILIRUBIN TOTAL: 0.4 mg/dL (ref 0.3–1.2)
BUN: 17 mg/dL (ref 6–20)
CHLORIDE: 101 mmol/L (ref 101–111)
CO2: 25 mmol/L (ref 22–32)
CREATININE: 0.69 mg/dL (ref 0.44–1.00)
Calcium: 9.1 mg/dL (ref 8.9–10.3)
GFR calc Af Amer: >60 mL/min (ref >60)
Glucose, Bld: 285 mg/dL — ABNORMAL HIGH (ref 65–99)
POTASSIUM: 3.7 mmol/L (ref 3.5–5.1)
Sodium: 134 mmol/L — ABNORMAL LOW (ref 135–145)
TOTAL PROTEIN: 7 g/dL (ref 6.5–8.1)

## 2017-03-04 LAB — CBC
HEMATOCRIT: 39.3 % (ref 35.0–47.0)
Hemoglobin: 13.4 g/dL (ref 12.0–16.0)
MCH: 31.2 pg (ref 26.0–34.0)
MCHC: 34.1 g/dL (ref 32.0–36.0)
MCV: 91.5 fL (ref 80.0–100.0)
PLATELETS: 209 10*3/uL (ref 150–440)
RBC: 4.3 MIL/uL (ref 3.80–5.20)
RDW: 12.8 % (ref 11.5–14.5)
WBC: 7.3 10*3/uL (ref 3.6–11.0)

## 2017-03-04 MED ORDER — LISINOPRIL 20 MG PO TABS: 20.0000 mg | ORAL_TABLET | Freq: Every day | ORAL | 1 refills | Status: AC

## 2017-03-04 MED ORDER — METFORMIN HCL 500 MG PO TABS: 1000.0000 mg | ORAL_TABLET | Freq: Two times a day (BID) | ORAL | 1 refills | Status: AC

## 2017-03-04 MED ORDER — LURASIDONE HCL 20 MG PO TABS: 20.0000 mg | ORAL_TABLET | Freq: Every day | ORAL | 0 refills | Status: AC

## 2017-03-04 MED ORDER — DIVALPROEX SODIUM ER 500 MG PO TB24: 500.0000 mg | ORAL_TABLET | Freq: Two times a day (BID) | ORAL | 1 refills | Status: DC

## 2017-03-04 MED ORDER — INSULIN ASPART PROT & ASPART (70-30 MIX) 100 UNIT/ML ~~LOC~~ SUSP: 10.0000 [IU] | Freq: Two times a day (BID) | SUBCUTANEOUS | 0 refills | Status: AC
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
## 2017-03-04 NOTE — ED Triage Notes (Signed)
Pt brought in via ems from [place] [facility].  Pt out of meds for approx 3 weeks.  Pt anxious and was given 5mg  haldol im by ems.  Pt alert and calm on arrival to er.

## 2017-03-04 NOTE — Discharge Instructions (Signed)
Please seek medical attention for any high fevers, chest pain, shortness of breath, change in behavior, persistent vomiting, bloody stool or any other new or concerning symptoms.  

## 2017-03-08 ENCOUNTER — Encounter: Payer: Self-pay | Admitting: Intensive Care

## 2017-03-08 ENCOUNTER — Emergency Department
Admission: EM | Admit: 2017-03-08 | Discharge: 2017-03-08 | Disposition: A | Discharge status: Home / Self Care | Payer: Medicaid Other | Attending: Emergency Medicine | Admitting: Emergency Medicine

## 2017-03-08 DIAGNOSIS — I2510 Atherosclerotic heart disease of native coronary artery without angina pectoris: Secondary | ICD-10-CM | POA: Diagnosis not present

## 2017-03-08 DIAGNOSIS — Z794 Long term (current) use of insulin: Secondary | ICD-10-CM | POA: Diagnosis not present

## 2017-03-08 DIAGNOSIS — F17210 Nicotine dependence, cigarettes, uncomplicated: Secondary | ICD-10-CM | POA: Diagnosis not present

## 2017-03-08 DIAGNOSIS — Z79899 Other long term (current) drug therapy: Secondary | ICD-10-CM | POA: Diagnosis not present

## 2017-03-08 DIAGNOSIS — E119 Type 2 diabetes mellitus without complications: Secondary | ICD-10-CM | POA: Diagnosis not present

## 2017-03-08 DIAGNOSIS — J449 Chronic obstructive pulmonary disease, unspecified: Secondary | ICD-10-CM | POA: Insufficient documentation

## 2017-03-08 DIAGNOSIS — J029 Acute pharyngitis, unspecified: Secondary | ICD-10-CM | POA: Diagnosis present

## 2017-03-08 DIAGNOSIS — I10 Essential (primary) hypertension: Secondary | ICD-10-CM | POA: Insufficient documentation

## 2017-03-08 DIAGNOSIS — Z85818 Personal history of malignant neoplasm of other sites of lip, oral cavity, and pharynx: Secondary | ICD-10-CM | POA: Diagnosis not present

## 2017-03-08 MED ORDER — AZITHROMYCIN 500 MG PO TABS
500.0000 mg | ORAL_TABLET | Freq: Once | ORAL | Status: AC
  Administered 2017-03-08: 500 mg via ORAL
  Filled 2017-03-08: qty 1

## 2017-03-08 MED ORDER — AZITHROMYCIN 500 MG PO TABS: 500.0000 mg | ORAL_TABLET | Freq: Every day | ORAL | 0 refills | Status: AC

## 2017-03-08 NOTE — ED Provider Notes (Signed)
[facility] Emergency Department Provider Note    First MD Initiated Contact with Patient 03/08/17 0800     (approximate)  I have reviewed the triage vital signs and the nursing notes.   HISTORY  Chief Complaint Sore Throat    HPI Carol Hughes is a 51 y.o. female with globus chronic medical conditions presents to the emergency department with acute onset of sore throat yesterday. Patient denies any fever no nausea or vomiting. In addition patient also admits to brief episode of wheezing before EMS arrival which she received a DuoNeb via EMS with complete resolution thereof.   Past Medical History:  Diagnosis Date  . Addiction to drug ([place])   . Cancer (HCC) throat  . COPD (chronic obstructive pulmonary disease) ([place])   . Coronary artery disease   . Diabetes mellitus without complication ([place])   . Hypertension   . Migraine   . Obesity   . Schizophrenia ([place])   . Seizures ([place])   . Sleep apnea   . Throat cancer [facility])     Patient Active Problem List   Diagnosis Date Noted  . Mastitis in female 07/05/2012  . Dehydration 07/05/2012  . Seizure disorder ([place]) 07/05/2012  . Chest pain 05/04/2012  . COPD (chronic obstructive pulmonary disease) ([place]) 05/04/2012  . Diabetes mellitus, type 2 ([place]) 05/04/2012  . Bipolar disorder ([place]) 05/04/2012    Past Surgical History:  Procedure Laterality Date  . ABDOMINAL HYSTERECTOMY    . APPENDECTOMY    . CESAREAN SECTION    . CHOLECYSTECTOMY    . EYE SURGERY    . KNEE SURGERY      Prior to Admission medications   Medication Sig Start Date End Date Taking? Authorizing Provider  acetaminophen (TYLENOL) 500 MG tablet Take 1 tablet (500 mg total) by mouth every 6 (six) hours as needed. 01/10/17   Horton, Barbette Hair, MD  albuterol (PROVENTIL HFA;VENTOLIN HFA) 108 (90 Base) MCG/ACT inhaler Inhale 2 puffs into the lungs every 6 (six) hours as needed for wheezing or shortness of breath. Patient not taking:  Reported on 10/02/2016 05/11/16   Rudene Re, MD  cephALEXin (KEFLEX) 500 MG capsule Take 1 capsule (500 mg total) by mouth 3 (three) times daily. Patient not taking: Reported on 09/08/2016 07/16/16   Waynetta Pean, PA-C  divalproex (DEPAKOTE ER) 500 MG 24 hr tablet Take 1 tablet (500 mg total) by mouth 2 (two) times daily. 03/04/17   Nance Pear, MD  doxycycline (VIBRAMYCIN) 100 MG capsule Take 1 capsule (100 mg total) by mouth 2 (two) times daily. Patient not taking: Reported on 10/02/2016 09/08/16   Ocie Cornfield T, PA-C  fluticasone [facility]) 50 MCG/ACT nasal spray Place 2 sprays into both nostrils daily. Patient not taking: Reported on 02/26/2017 01/10/17   Horton, Barbette Hair, MD  insulin aspart protamine- aspart (NOVOLOG MIX 70/30) (70-30) 100 UNIT/ML injection Inject 0.1 mLs (10 Units total) into the skin 2 (two) times daily with a meal. Reported on 04/01/2015 03/04/17   Nance Pear, MD  lisinopril (PRINIVIL,ZESTRIL) 20 MG tablet Take 1 tablet (20 mg total) by mouth daily. 03/04/17 03/04/18  Nance Pear, MD  lurasidone (LATUDA) 20 MG TABS tablet Take 1 tablet (20 mg total) by mouth daily. 03/04/17   Nance Pear, MD  metFORMIN (GLUCOPHAGE) 500 MG tablet Take 2 tablets (1,000 mg total) by mouth 2 (two) times daily with a meal. 03/04/17   Nance Pear, MD  neomycin-polymyxin-hydrocortisone (CORTISPORIN) 3.5-10000-1 OTIC suspension Place 3 drops into the  right ear 3 (three) times daily. Patient not taking: Reported on 02/26/2017 01/10/17   Horton, Barbette Hair, MD  nitroGLYCERIN (NITROSTAT) 0.4 MG SL tablet Place 1 tablet (0.4 mg total) under the tongue every 5 (five) minutes as needed for chest pain. Patient not taking: Reported on 02/26/2017 05/11/16 05/11/17  Rudene Re, MD  ondansetron (ZOFRAN) 4 MG tablet Take 1 tablet (4 mg total) by mouth every 8 (eight) hours as needed for nausea or vomiting. Patient not taking: Reported on 09/08/2016 05/11/16   Rudene Re, MD  pramoxine (PROCTOFOAM) 1 % foam Place 1 application rectally 3 (three) times daily as needed for hemorrhoids. 02/26/17   Carrie Mew, MD  senna-docusate (SENOKOT-S) 8.6-50 MG tablet Take 2 tablets by mouth 2 (two) times daily. 02/26/17   Carrie Mew, MD    Allergies Aspirin; Bee venom; Other; Naproxen; Toradol [ketorolac tromethamine]; Penicillins; Tramadol; and Vancomycin  Family History  Problem Relation Age of Onset  . Cancer Father     Social History Social History   Tobacco Use  . Smoking status: Current Every Day Smoker    Packs/day: 0.50    Years: 30.00    Pack years: 15.00    Types: Cigarettes  . Smokeless tobacco: Never Used  Substance Use Topics  . Alcohol use: No  . Drug use: No    Comment: Prior Hx crack cocaine abuse.    Review of Systems Constitutional: No fever/chills Eyes: No visual changes. ENT: Positive for sore throat. Cardiovascular: Denies chest pain. Respiratory: Denies shortness of breath. Gastrointestinal: No abdominal pain.  No nausea, no vomiting.  No diarrhea.  No constipation. Genitourinary: Negative for dysuria. Musculoskeletal: Negative for neck pain.  Negative for back pain. Integumentary: Negative for rash. Neurological: Negative for headaches, focal weakness or numbness.   ____________________________________________   PHYSICAL EXAM:  VITAL SIGNS: ED Triage Vitals  Enc Vitals Group     BP 03/08/17 0803 121/88     Pulse Rate 03/08/17 0803 85     Resp 03/08/17 0803 16     Temp 03/08/17 0803 97.9 F (36.6 C)     Temp Source 03/08/17 0803 Oral     SpO2 03/08/17 0803 99 %     Weight 03/08/17 0804 88.5 kg (195 lb)     Height 03/08/17 0804 1.575 m (5\' 2" )     Head Circumference --      Peak Flow --      Pain Score 03/08/17 0803 9     Pain Loc --      Pain Edu? --      Excl. in [place]? --     Constitutional: Alert and oriented. Well appearing and in no acute distress. Eyes: Conjunctivae are normal.    Head: Atraumatic. Ears:  Healthy appearing ear canals and TMs bilaterally Nose: No congestion/rhinnorhea. Mouth/Throat: Mucous membranes are moist. Pharyngeal erythema no exudate noted.  Neck: No stridor.  Submandibular lymphadenopathy. Cardiovascular: Normal rate, regular rhythm. Good peripheral circulation. Grossly normal heart sounds. Respiratory: Normal respiratory effort.  No retractions. Lungs CTAB. Gastrointestinal: Soft and nontender. No distention.  Musculoskeletal: No lower extremity tenderness nor edema. No gross deformities of extremities. Neurologic:  Normal speech and language. No gross focal neurologic deficits are appreciated.  Skin:  Skin is warm, dry and intact. No rash noted. Psychiatric: Mood and affect are normal. Speech and behavior are normal.     Procedures   ____________________________________________   INITIAL IMPRESSION / ASSESSMENT AND PLAN / ED COURSE  As part of my  medical decision making, I reviewed the following data within the electronic MEDICAL RECORD [id number] year-old female presenting with history of physical exam consistent with acute pharyngitis. Patient given azithromycin secondary to penicillin allergy. ____________________________________________  FINAL CLINICAL IMPRESSION(S) / ED DIAGNOSES  Final diagnoses:  Pharyngitis, unspecified etiology     MEDICATIONS GIVEN DURING THIS VISIT:  Medications  azithromycin (ZITHROMAX) tablet 500 mg (500 mg Oral Given 03/08/17 0825)     ED Discharge Orders    None       Note:  This document was prepared using Dragon voice recognition software and may include unintentional dictation errors.    Gregor Hams, MD 03/08/17 [phone]

## 2017-03-08 NOTE — ED Triage Notes (Addendum)
Arrived by EMS. Patient c/o sore throat for a few days. Reports difficulty swallowing due to swollen throat. No respiratory distress. No problems speaking. EMS gave 1 duoneb

## 2017-03-16 ENCOUNTER — Other Ambulatory Visit: Payer: Self-pay

## 2017-03-16 ENCOUNTER — Encounter: Payer: Self-pay | Admitting: Emergency Medicine

## 2017-03-16 ENCOUNTER — Emergency Department: Payer: Medicaid Other

## 2017-03-16 ENCOUNTER — Emergency Department
Admission: EM | Admit: 2017-03-16 | Discharge: 2017-03-16 | Disposition: A | Discharge status: Home / Self Care | Payer: Medicaid Other | Attending: Student in an Organized Health Care Education/Training Program | Admitting: Student in an Organized Health Care Education/Training Program

## 2017-03-16 DIAGNOSIS — J449 Chronic obstructive pulmonary disease, unspecified: Secondary | ICD-10-CM | POA: Diagnosis not present

## 2017-03-16 DIAGNOSIS — F17210 Nicotine dependence, cigarettes, uncomplicated: Secondary | ICD-10-CM | POA: Diagnosis not present

## 2017-03-16 DIAGNOSIS — I10 Essential (primary) hypertension: Secondary | ICD-10-CM | POA: Diagnosis not present

## 2017-03-16 DIAGNOSIS — S9002XA Contusion of left ankle, initial encounter: Secondary | ICD-10-CM | POA: Insufficient documentation

## 2017-03-16 DIAGNOSIS — W2203XA Walked into furniture, initial encounter: Secondary | ICD-10-CM | POA: Insufficient documentation

## 2017-03-16 DIAGNOSIS — Y92003 Bedroom of unspecified non-institutional (private) residence as the place of occurrence of the external cause: Secondary | ICD-10-CM | POA: Diagnosis not present

## 2017-03-16 DIAGNOSIS — Z794 Long term (current) use of insulin: Secondary | ICD-10-CM | POA: Insufficient documentation

## 2017-03-16 DIAGNOSIS — Y9301 Activity, walking, marching and hiking: Secondary | ICD-10-CM | POA: Diagnosis not present

## 2017-03-16 DIAGNOSIS — Z79899 Other long term (current) drug therapy: Secondary | ICD-10-CM | POA: Insufficient documentation

## 2017-03-16 DIAGNOSIS — I2510 Atherosclerotic heart disease of native coronary artery without angina pectoris: Secondary | ICD-10-CM | POA: Insufficient documentation

## 2017-03-16 DIAGNOSIS — E119 Type 2 diabetes mellitus without complications: Secondary | ICD-10-CM | POA: Insufficient documentation

## 2017-03-16 DIAGNOSIS — Y999 Unspecified external cause status: Secondary | ICD-10-CM | POA: Diagnosis not present

## 2017-03-16 DIAGNOSIS — S99912A Unspecified injury of left ankle, initial encounter: Secondary | ICD-10-CM | POA: Diagnosis present

## 2017-03-16 MED ORDER — PREDNISONE 50 MG PO TABS: ORAL_TABLET | ORAL | 0 refills | Status: AC

## 2017-03-16 MED ORDER — DEXAMETHASONE SODIUM PHOSPHATE 10 MG/ML IJ SOLN
10.0000 mg | Freq: Once | INTRAMUSCULAR | Status: AC
  Administered 2017-03-16: 10 mg via INTRAMUSCULAR
  Filled 2017-03-16: qty 1

## 2017-03-16 NOTE — ED Provider Notes (Signed)
[facility] Emergency Department Provider Note  ____________________________________________  Time seen: Approximately 9:03 PM  I have reviewed the triage vital signs and the nursing notes.   HISTORY  Chief Complaint Ankle Injury    HPI Carol Hughes is a 51 y.o. female presents to the emergency department with left ankle pain after she struck it against the bed post of her bed.  Patient is crying.  She denies weakness, radiculopathy or changes in sensation. No alleviating measures have been attempted.   Past Medical History:  Diagnosis Date  . Addiction to drug ([place])   . Cancer (HCC) throat  . COPD (chronic obstructive pulmonary disease) ([place])   . Coronary artery disease   . Diabetes mellitus without complication ([place])   . Hypertension   . Migraine   . Obesity   . Schizophrenia ([place])   . Seizures ([place])   . Sleep apnea   . Throat cancer [facility])     Patient Active Problem List   Diagnosis Date Noted  . Mastitis in female 07/05/2012  . Dehydration 07/05/2012  . Seizure disorder ([place]) 07/05/2012  . Chest pain 05/04/2012  . COPD (chronic obstructive pulmonary disease) ([place]) 05/04/2012  . Diabetes mellitus, type 2 ([place]) 05/04/2012  . Bipolar disorder ([place]) 05/04/2012    Past Surgical History:  Procedure Laterality Date  . ABDOMINAL HYSTERECTOMY    . APPENDECTOMY    . CESAREAN SECTION    . CHOLECYSTECTOMY    . EYE SURGERY    . KNEE SURGERY      Prior to Admission medications   Medication Sig Start Date End Date Taking? Authorizing Provider  acetaminophen (TYLENOL) 500 MG tablet Take 1 tablet (500 mg total) by mouth every 6 (six) hours as needed. 01/10/17   Horton, Barbette Hair, MD  albuterol (PROVENTIL HFA;VENTOLIN HFA) 108 (90 Base) MCG/ACT inhaler Inhale 2 puffs into the lungs every 6 (six) hours as needed for wheezing or shortness of breath. Patient not taking: Reported on 10/02/2016 05/11/16   Rudene Re, MD  cephALEXin (KEFLEX)  500 MG capsule Take 1 capsule (500 mg total) by mouth 3 (three) times daily. Patient not taking: Reported on 09/08/2016 07/16/16   Waynetta Pean, PA-C  divalproex (DEPAKOTE ER) 500 MG 24 hr tablet Take 1 tablet (500 mg total) by mouth 2 (two) times daily. 03/04/17   Nance Pear, MD  doxycycline (VIBRAMYCIN) 100 MG capsule Take 1 capsule (100 mg total) by mouth 2 (two) times daily. Patient not taking: Reported on 10/02/2016 09/08/16   Ocie Cornfield T, PA-C  fluticasone [facility]) 50 MCG/ACT nasal spray Place 2 sprays into both nostrils daily. Patient not taking: Reported on 02/26/2017 01/10/17   Horton, Barbette Hair, MD  insulin aspart protamine- aspart (NOVOLOG MIX 70/30) (70-30) 100 UNIT/ML injection Inject 0.1 mLs (10 Units total) into the skin 2 (two) times daily with a meal. Reported on 04/01/2015 03/04/17   Nance Pear, MD  lisinopril (PRINIVIL,ZESTRIL) 20 MG tablet Take 1 tablet (20 mg total) by mouth daily. 03/04/17 03/04/18  Nance Pear, MD  lurasidone (LATUDA) 20 MG TABS tablet Take 1 tablet (20 mg total) by mouth daily. 03/04/17   Nance Pear, MD  metFORMIN (GLUCOPHAGE) 500 MG tablet Take 2 tablets (1,000 mg total) by mouth 2 (two) times daily with a meal. 03/04/17   Nance Pear, MD  neomycin-polymyxin-hydrocortisone (CORTISPORIN) 3.5-10000-1 OTIC suspension Place 3 drops into the right ear 3 (three) times daily. Patient not taking: Reported on 02/26/2017 01/10/17   Horton, Barbette Hair, MD  nitroGLYCERIN (NITROSTAT) 0.4 MG SL tablet Place 1 tablet (0.4 mg total) under the tongue every 5 (five) minutes as needed for chest pain. Patient not taking: Reported on 02/26/2017 05/11/16 05/11/17  Rudene Re, MD  ondansetron (ZOFRAN) 4 MG tablet Take 1 tablet (4 mg total) by mouth every 8 (eight) hours as needed for nausea or vomiting. Patient not taking: Reported on 09/08/2016 05/11/16   Rudene Re, MD  pramoxine (PROCTOFOAM) 1 % foam Place 1 application rectally 3  (three) times daily as needed for hemorrhoids. 02/26/17   Carrie Mew, MD  predniSONE (DELTASONE) 50 MG tablet Take one 50 mg tablet once a day for 5 days. 03/16/17   Lannie Fields, PA-C  senna-docusate (SENOKOT-S) 8.6-50 MG tablet Take 2 tablets by mouth 2 (two) times daily. 02/26/17   Carrie Mew, MD    Allergies Aspirin; Bee venom; Other; Naproxen; Toradol [ketorolac tromethamine]; Penicillins; Tramadol; and Vancomycin  Family History  Problem Relation Age of Onset  . Cancer Father     Social History Social History   Tobacco Use  . Smoking status: Current Every Day Smoker    Packs/day: 0.50    Years: 30.00    Pack years: 15.00    Types: Cigarettes  . Smokeless tobacco: Never Used  Substance Use Topics  . Alcohol use: No  . Drug use: No    Comment: Prior Hx crack cocaine abuse.     Review of Systems  Constitutional: No fever/chills Eyes: No visual changes. No discharge ENT: No upper respiratory complaints. Cardiovascular: no chest pain. Respiratory: no cough. No SOB. Musculoskeletal: Patient has left ankle pain.  Skin: Negative for rash, abrasions, lacerations, ecchymosis. Neurological: Negative for headaches, focal weakness or numbness.  ____________________________________________   PHYSICAL EXAM:  VITAL SIGNS: ED Triage Vitals  Enc Vitals Group     BP 03/16/17 2025 120/62     Pulse Rate 03/16/17 2025 88     Resp 03/16/17 2025 18     Temp 03/16/17 2025 98 F (36.7 C)     Temp Source 03/16/17 2025 Oral     SpO2 --      Weight 03/16/17 1751 195 lb (88.5 kg)     Height 03/16/17 1751 5\' 2"  (1.575 m)     Head Circumference --      Peak Flow --      Pain Score 03/16/17 1751 8     Pain Loc --      Pain Edu? --      Excl. in [place]? --      Constitutional: Alert and oriented. Well appearing and in no acute distress. Eyes: Conjunctivae are normal. PERRL. EOMI. Head: Atraumatic.  Cardiovascular: Normal rate, regular rhythm. Normal S1 and S2.   Good peripheral circulation. Respiratory: Normal respiratory effort without tachypnea or retractions. Lungs CTAB. Good air entry to the bases with no decreased or absent breath sounds. Musculoskeletal: Patient is able to perform full range of motion at the left ankle.  She is able to move all 5 left toes.  No pain with palpation of the fibula.  Palpable dorsalis pedis pulse, left. Neurologic:  Normal speech and language. No gross focal neurologic deficits are appreciated.  Skin:  Skin is warm, dry and intact. No rash noted. Psychiatric: Mood and affect are normal. Speech and behavior are normal. Patient exhibits appropriate insight and judgement.   ____________________________________________   LABS (all labs ordered are listed, but only abnormal results are displayed)  Labs Reviewed - No data to display  ____________________________________________  EKG   ____________________________________________  RADIOLOGY Unk Pinto, personally viewed and evaluated these images (plain radiographs) as part of my medical decision making, as well as reviewing the written report by the radiologist.  Dg Ankle Complete Left  Result Date: 03/16/2017 CLINICAL DATA:  Left ankle trauma with pain and swelling. Initial encounter. EXAM: LEFT ANKLE COMPLETE - 3+ VIEW COMPARISON:  None. FINDINGS: There is no evidence of acute fracture, dislocation, or joint effusion. Small heel spur. IMPRESSION: Negative. Electronically Signed   By: Monte Fantasia M.D.   On: 03/16/2017 18:55    ____________________________________________    PROCEDURES  Procedure(s) performed:    Procedures    Medications  dexamethasone (DECADRON) injection 10 mg (10 mg Intramuscular Given 03/16/17 1955)     ____________________________________________   INITIAL IMPRESSION / ASSESSMENT AND PLAN / ED COURSE  Pertinent labs & imaging results that were available during my care of the patient were reviewed by me and  considered in my medical decision making (see chart for details).  Review of the [place] CSRS was performed in accordance of the [place] prior to dispensing any controlled drugs.    Assessment and plan Left ankle pain Patient presents to the emergency department with left ankle pain after striking her ankle against her bedpost.  Overall physical exam is reassuring.  Original differential diagnosis included contusion versus fracture.  No fractures were identified on x-ray examination.  Patient was given an injection of Decadron and discharged with prednisone as patient cannot tolerate anti-inflammatories or tramadol.     ____________________________________________  FINAL CLINICAL IMPRESSION(S) / ED DIAGNOSES  Final diagnoses:  Contusion of left ankle, initial encounter      NEW MEDICATIONS STARTED DURING THIS VISIT:  ED Discharge Orders        Ordered    predniSONE (DELTASONE) 50 MG tablet     03/16/17 1946          This chart was dictated using voice recognition software/Dragon. Despite best efforts to proofread, errors can occur which can change the meaning. Any change was purely unintentional.    Karren Cobble 03/16/17 2107    Merlyn Lot, MD 03/20/17 [phone]

## 2017-03-16 NOTE — ED Triage Notes (Signed)
Blankets given to patient to "prop left leg up" while waiting.  Patient had requested leg be propped up because leg was throbbing--offered to elevate leg on padded bench, patient refused stating "not on that hard thing".

## 2017-03-16 NOTE — ED Triage Notes (Signed)
States hit left ankle on bed post today approximately 2 hour PTA.  C/O pain and swelling to ankle

## 2017-03-18 ENCOUNTER — Ambulatory Visit: Payer: Self-pay

## 2017-03-19 ENCOUNTER — Emergency Department
Admission: EM | Admit: 2017-03-19 | Discharge: 2017-03-19 | Disposition: A | Discharge status: Home / Self Care | Payer: Medicaid Other | Attending: Emergency Medicine | Admitting: Emergency Medicine

## 2017-03-19 DIAGNOSIS — I2510 Atherosclerotic heart disease of native coronary artery without angina pectoris: Secondary | ICD-10-CM | POA: Insufficient documentation

## 2017-03-19 DIAGNOSIS — F17210 Nicotine dependence, cigarettes, uncomplicated: Secondary | ICD-10-CM | POA: Diagnosis not present

## 2017-03-19 DIAGNOSIS — R110 Nausea: Secondary | ICD-10-CM | POA: Diagnosis present

## 2017-03-19 DIAGNOSIS — J449 Chronic obstructive pulmonary disease, unspecified: Secondary | ICD-10-CM | POA: Insufficient documentation

## 2017-03-19 DIAGNOSIS — I10 Essential (primary) hypertension: Secondary | ICD-10-CM | POA: Diagnosis not present

## 2017-03-19 DIAGNOSIS — B349 Viral infection, unspecified: Secondary | ICD-10-CM | POA: Diagnosis not present

## 2017-03-19 DIAGNOSIS — E119 Type 2 diabetes mellitus without complications: Secondary | ICD-10-CM | POA: Insufficient documentation

## 2017-03-19 LAB — COMPREHENSIVE METABOLIC PANEL
ALBUMIN: 3.7 g/dL (ref 3.5–5.0)
ALK PHOS: 117 U/L (ref 38–126)
ALT: 9 U/L — ABNORMAL LOW (ref 14–54)
ANION GAP: 9 (ref 5–15)
AST: 24 U/L (ref 15–41)
BUN: 14 mg/dL (ref 6–20)
CALCIUM: 9 mg/dL (ref 8.9–10.3)
CHLORIDE: 100 mmol/L — AB (ref 101–111)
CO2: 26 mmol/L (ref 22–32)
Creatinine, Ser: 0.76 mg/dL (ref 0.44–1.00)
GFR calc non Af Amer: >60 mL/min (ref >60)
GLUCOSE: 197 mg/dL — AB (ref 65–99)
Potassium: 3.5 mmol/L (ref 3.5–5.1)
SODIUM: 135 mmol/L (ref 135–145)
Total Bilirubin: 0.5 mg/dL (ref 0.3–1.2)
Total Protein: 7 g/dL (ref 6.5–8.1)

## 2017-03-19 LAB — CBC
HCT: 40.8 % (ref 35.0–47.0)
HEMOGLOBIN: 14 g/dL (ref 12.0–16.0)
MCH: 30.9 pg (ref 26.0–34.0)
MCHC: 34.3 g/dL (ref 32.0–36.0)
MCV: 90.2 fL (ref 80.0–100.0)
Platelets: 209 10*3/uL (ref 150–440)
RBC: 4.53 MIL/uL (ref 3.80–5.20)
RDW: 12.5 % (ref 11.5–14.5)
WBC: 10.4 10*3/uL (ref 3.6–11.0)

## 2017-03-19 LAB — URINALYSIS, COMPLETE (UACMP) WITH MICROSCOPIC
BACTERIA UA: NONE SEEN
Bilirubin Urine: NEGATIVE
KETONES UR: NEGATIVE mg/dL
Leukocytes, UA: NEGATIVE
Nitrite: NEGATIVE
PROTEIN: NEGATIVE mg/dL
Specific Gravity, Urine: 1.025 (ref 1.005–1.030)
pH: 5 (ref 5.0–8.0)

## 2017-03-19 LAB — LIPASE, BLOOD: LIPASE: 35 U/L (ref 11–51)

## 2017-03-19 MED ORDER — MORPHINE SULFATE (PF) 4 MG/ML IV SOLN
4.0000 mg | Freq: Once | INTRAVENOUS | Status: AC
  Administered 2017-03-19: 4 mg via INTRAVENOUS
  Filled 2017-03-19: qty 1

## 2017-03-19 MED ORDER — DICYCLOMINE HCL 20 MG PO TABS: 20.0000 mg | ORAL_TABLET | Freq: Three times a day (TID) | ORAL | 0 refills | Status: AC | PRN

## 2017-03-19 MED ORDER — LOPERAMIDE HCL 2 MG PO CAPS
4.0000 mg | ORAL_CAPSULE | Freq: Once | ORAL | Status: AC
  Administered 2017-03-19: 4 mg via ORAL
  Filled 2017-03-19: qty 2

## 2017-03-19 MED ORDER — ONDANSETRON 4 MG PO TBDP: 4.0000 mg | ORAL_TABLET | Freq: Three times a day (TID) | ORAL | 0 refills | Status: AC | PRN

## 2017-03-19 MED ORDER — SODIUM CHLORIDE 0.9 % IV SOLN
Freq: Once | INTRAVENOUS | Status: AC
Start: 2017-03-19 — End: 2017-03-19
  Administered 2017-03-19: 21:00:00 via INTRAVENOUS

## 2017-03-19 MED ORDER — ONDANSETRON HCL 4 MG/2ML IJ SOLN
4.0000 mg | Freq: Once | INTRAMUSCULAR | Status: AC
  Administered 2017-03-19: 4 mg via INTRAVENOUS
  Filled 2017-03-19: qty 2

## 2017-03-19 NOTE — ED Notes (Signed)
Pt outside on crutches before triage.

## 2017-03-19 NOTE — ED Notes (Signed)
Esign wouldn't work, patient verbalized discharge instructions and has no questions at this time.

## 2017-03-19 NOTE — ED Notes (Signed)
Pt arrives via [place] with cough and congestion for two weeks.

## 2017-03-19 NOTE — ED Notes (Signed)
Per pt request shelter called and spoke with Pricilla Holm who will be calling cab for pt to be picked up. Pt notified of ride coming for her

## 2017-03-19 NOTE — ED Provider Notes (Signed)
[facility] Emergency Department Provider Note       Time seen: ----------------------------------------- 9:18 PM on 03/19/2017 -----------------------------------------   I have reviewed the triage vital signs and the nursing notes.  HISTORY   Chief Complaint Weakness and Nausea    HPI Carol Hughes is a 51 y.o. female with a history of cancer, COPD, diabetes, hypertension, schizophrenia who presents to the ED for nausea, diarrhea and weakness.  She is also had a cough, she denies vomiting but has had diarrhea for the past 24 hours.  Pain is also 9 out of 10 in her head, nothing makes it better or worse.  Patient states she feels dehydrated.  Past Medical History:  Diagnosis Date  . Addiction to drug ([place])   . Cancer (HCC) throat  . COPD (chronic obstructive pulmonary disease) ([place])   . Coronary artery disease   . Diabetes mellitus without complication ([place])   . Hypertension   . Migraine   . Obesity   . Schizophrenia ([place])   . Seizures ([place])   . Sleep apnea   . Throat cancer [facility])     Patient Active Problem List   Diagnosis Date Noted  . Mastitis in female 07/05/2012  . Dehydration 07/05/2012  . Seizure disorder ([place]) 07/05/2012  . Chest pain 05/04/2012  . COPD (chronic obstructive pulmonary disease) ([place]) 05/04/2012  . Diabetes mellitus, type 2 ([place]) 05/04/2012  . Bipolar disorder ([place]) 05/04/2012    Past Surgical History:  Procedure Laterality Date  . ABDOMINAL HYSTERECTOMY    . APPENDECTOMY    . CESAREAN SECTION    . CHOLECYSTECTOMY    . EYE SURGERY    . KNEE SURGERY      Allergies Aspirin; Bee venom; Other; Naproxen; Toradol [ketorolac tromethamine]; Penicillins; Tramadol; and Vancomycin  Social History Social History   Tobacco Use  . Smoking status: Current Every Day Smoker    Packs/day: 0.50    Years: 30.00    Pack years: 15.00    Types: Cigarettes  . Smokeless tobacco: Never Used  Substance Use Topics  .  Alcohol use: No  . Drug use: No    Comment: Prior Hx crack cocaine abuse.    Review of Systems Constitutional: Negative for fever. Cardiovascular: Negative for chest pain. Respiratory: Negative for shortness of breath.  Positive for cough Gastrointestinal: Positive for vomiting and diarrhea Genitourinary: Negative for dysuria. Musculoskeletal: Negative for back pain. Skin: Negative for rash. Neurological: Positive for weakness  All systems negative/normal/unremarkable except as stated in the HPI  ____________________________________________   PHYSICAL EXAM:  VITAL SIGNS: ED Triage Vitals  Enc Vitals Group     BP 03/19/17 2035 122/85     Pulse Rate 03/19/17 2033 78     Resp 03/19/17 2033 18     Temp 03/19/17 2033 99.1 F (37.3 C)     Temp Source 03/19/17 2033 Oral     SpO2 03/19/17 2033 98 %     Weight 03/19/17 2034 200 lb (90.7 kg)     Height --      Head Circumference --      Peak Flow --      Pain Score 03/19/17 2033 9     Pain Loc --      Pain Edu? --      Excl. in [place]? --     Constitutional: Alert and oriented. Well appearing and in no distress. Eyes: Conjunctivae are normal. Normal extraocular movements. ENT   Head: Normocephalic and atraumatic.  Nose: No congestion/rhinnorhea.   Mouth/Throat: Mucous membranes are moist.   Neck: No stridor. Cardiovascular: Normal rate, regular rhythm. No murmurs, rubs, or gallops. Respiratory: Normal respiratory effort without tachypnea nor retractions. Breath sounds are clear and equal bilaterally. No wheezes/rales/rhonchi. Gastrointestinal: Soft and nontender. Normal bowel sounds Musculoskeletal: Nontender with normal range of motion in extremities. No lower extremity tenderness nor edema. Neurologic:  Normal speech and language. No gross focal neurologic deficits are appreciated.  Skin:  Skin is warm, dry and intact. No rash noted. Psychiatric: Mood and affect are normal. Speech and behavior are normal.   ____________________________________________  ED COURSE:  As part of my medical decision making, I reviewed the following data within the [place] History obtained from family if available, nursing notes, old chart and ekg, as well as notes from prior ED visits. Patient presented for weakness with diarrhea and nausea, we will assess with labs and imaging as indicated at this time.   Procedures ____________________________________________   LABS (pertinent positives/negatives)  Labs Reviewed  COMPREHENSIVE METABOLIC PANEL - Abnormal; Notable for the following components:      Result Value   Chloride 100 (*)    Glucose, Bld 197 (*)    ALT 9 (*)    All other components within normal limits  URINALYSIS, COMPLETE (UACMP) WITH MICROSCOPIC - Abnormal; Notable for the following components:   Color, Urine YELLOW (*)    APPearance CLEAR (*)    Glucose, UA >=500 (*)    Hgb urine dipstick SMALL (*)    Squamous Epithelial / LPF 0-5 (*)    All other components within normal limits  LIPASE, BLOOD  CBC  POC URINE PREG, ED   ____________________________________________  DIFFERENTIAL DIAGNOSIS   Viral syndrome, C. difficile colitis, electrolyte abnormality, dehydration, influenza  FINAL ASSESSMENT AND PLAN  Viral syndrome   Plan: Patient had presented for symptoms of nausea, diarrhea, weakness and cough. Patient's labs are reassuring here.  She did not require any imaging during her stay.  She was given fluids as well as antiemetics and pain medicine.  She is stable for outpatient follow-up.   Earleen Newport, MD   Note: This note was generated in part or whole with voice recognition software. Voice recognition is usually quite accurate but there are transcription errors that can and very often do occur. I apologize for any typographical errors that were not detected and corrected.     Earleen Newport, MD 03/19/17 2120

## 2017-03-19 NOTE — ED Triage Notes (Signed)
Patient c/o nausea, diarrhea, weakness and cough. Patient denies emesis beginning yesterday.

## 2017-03-30 ENCOUNTER — Ambulatory Visit: Payer: Self-pay

## 2017-04-10 ENCOUNTER — Encounter (HOSPITAL_COMMUNITY): Payer: Self-pay

## 2017-04-10 ENCOUNTER — Emergency Department (HOSPITAL_COMMUNITY): Payer: Medicaid Other

## 2017-04-10 ENCOUNTER — Emergency Department (HOSPITAL_COMMUNITY)
Admission: EM | Admit: 2017-04-10 | Discharge: 2017-04-11 | Disposition: A | Discharge status: Home / Self Care | Payer: Medicaid Other | Attending: Emergency Medicine | Admitting: Emergency Medicine

## 2017-04-10 DIAGNOSIS — J449 Chronic obstructive pulmonary disease, unspecified: Secondary | ICD-10-CM | POA: Diagnosis not present

## 2017-04-10 DIAGNOSIS — F17210 Nicotine dependence, cigarettes, uncomplicated: Secondary | ICD-10-CM | POA: Insufficient documentation

## 2017-04-10 DIAGNOSIS — Z79899 Other long term (current) drug therapy: Secondary | ICD-10-CM | POA: Diagnosis not present

## 2017-04-10 DIAGNOSIS — E1165 Type 2 diabetes mellitus with hyperglycemia: Secondary | ICD-10-CM | POA: Insufficient documentation

## 2017-04-10 DIAGNOSIS — R079 Chest pain, unspecified: Secondary | ICD-10-CM | POA: Diagnosis present

## 2017-04-10 DIAGNOSIS — I10 Essential (primary) hypertension: Secondary | ICD-10-CM | POA: Diagnosis not present

## 2017-04-10 DIAGNOSIS — I2510 Atherosclerotic heart disease of native coronary artery without angina pectoris: Secondary | ICD-10-CM | POA: Insufficient documentation

## 2017-04-10 DIAGNOSIS — Z85818 Personal history of malignant neoplasm of other sites of lip, oral cavity, and pharynx: Secondary | ICD-10-CM | POA: Insufficient documentation

## 2017-04-10 DIAGNOSIS — Z590 Homelessness: Secondary | ICD-10-CM | POA: Diagnosis not present

## 2017-04-10 DIAGNOSIS — R739 Hyperglycemia, unspecified: Secondary | ICD-10-CM

## 2017-04-10 DIAGNOSIS — Z7984 Long term (current) use of oral hypoglycemic drugs: Secondary | ICD-10-CM | POA: Diagnosis not present

## 2017-04-10 LAB — CBC
HCT: 41.5 % (ref 36.0–46.0)
Hemoglobin: 14.2 g/dL (ref 12.0–15.0)
MCH: 30.9 pg (ref 26.0–34.0)
MCHC: 34.2 g/dL (ref 30.0–36.0)
MCV: 90.4 fL (ref 78.0–100.0)
Platelets: 184 10*3/uL (ref 150–400)
RBC: 4.59 MIL/uL (ref 3.87–5.11)
RDW: 12.4 % (ref 11.5–15.5)
WBC: 8.7 10*3/uL (ref 4.0–10.5)

## 2017-04-10 LAB — BASIC METABOLIC PANEL
Anion gap: 11 (ref 5–15)
BUN: 14 mg/dL (ref 6–20)
CALCIUM: 9.4 mg/dL (ref 8.9–10.3)
CO2: 24 mmol/L (ref 22–32)
Chloride: 100 mmol/L — ABNORMAL LOW (ref 101–111)
Creatinine, Ser: 0.73 mg/dL (ref 0.44–1.00)
GFR calc Af Amer: >60 mL/min (ref >60)
GFR calc non Af Amer: >60 mL/min (ref >60)
Glucose, Bld: 399 mg/dL — ABNORMAL HIGH (ref 65–99)
Potassium: 3.8 mmol/L (ref 3.5–5.1)
Sodium: 135 mmol/L (ref 135–145)

## 2017-04-10 LAB — I-STAT TROPONIN, ED
TROPONIN I, POC: 0.01 ng/mL (ref 0.00–0.08)
TROPONIN I, POC: 0.01 ng/mL (ref 0.00–0.08)

## 2017-04-10 MED ORDER — INSULIN ASPART 100 UNIT/ML ~~LOC~~ SOLN
4.0000 [IU] | Freq: Once | SUBCUTANEOUS | Status: AC
  Administered 2017-04-10: 4 [IU] via INTRAVENOUS
  Filled 2017-04-10: qty 1

## 2017-04-10 MED ORDER — MORPHINE SULFATE (PF) 4 MG/ML IV SOLN
2.0000 mg | Freq: Once | INTRAVENOUS | Status: AC
  Administered 2017-04-10: 2 mg via INTRAVENOUS
  Filled 2017-04-10: qty 1

## 2017-04-10 MED ORDER — SODIUM CHLORIDE 0.9 % IV BOLUS (SEPSIS)
1000.0000 mL | Freq: Once | INTRAVENOUS | Status: AC
  Administered 2017-04-10: 1000 mL via INTRAVENOUS

## 2017-04-10 NOTE — ED Triage Notes (Signed)
To room via EMS.  [place], as pt is homeless.  Onset 8 hours PTA that is worsening.  Per EMS pt was anxious and hyperventilating upon arrival.  EKG showed initial ST, changing to NSR after pt calmed down.  EMS gave ASA 324 mg, NTG x 1, pt refused more NTG d/t causing headache.  Pain at this time is down from 10/10 pain scale to 8/10 pain scale.  After NTG BP dropped to 75/49, last BP 114/82.  Pt tearful, reports she has not ate in 2 days, due to this she arrived in [place] today from [place].  Endorses green diarrhea today.

## 2017-04-10 NOTE — ED Provider Notes (Addendum)
[place] EMERGENCY DEPARTMENT Provider Note   CSN: [id number] Arrival date & time: 04/10/17  1954     History   Chief Complaint Chief Complaint  Patient presents with  . Chest Pain    HPI Carol Hughes is a 52 y.o. female.  Patient presents with chest pain since early this morning without dyspnea, diaphoresis, nausea.  Past medical history includes CAD, hypertension, diabetes, schizophrenia, COPD, drug problems.  Social history: She is homeless.  EMS gave her one aspirin tablet and 1 nitroglycerin tab which gave her headache.  And also dropped her blood pressure.  Pain is now dropped from a 10-8.      Past Medical History:  Diagnosis Date  . Addiction to drug ([place])   . Cancer (HCC) throat  . COPD (chronic obstructive pulmonary disease) ([place])   . Coronary artery disease   . Diabetes mellitus without complication ([place])   . Hypertension   . Migraine   . Obesity   . Schizophrenia ([place])   . Seizures ([place])   . Sleep apnea   . Throat cancer [facility])     Patient Active Problem List   Diagnosis Date Noted  . Mastitis in female 07/05/2012  . Dehydration 07/05/2012  . Seizure disorder ([place]) 07/05/2012  . Chest pain 05/04/2012  . COPD (chronic obstructive pulmonary disease) ([place]) 05/04/2012  . Diabetes mellitus, type 2 ([place]) 05/04/2012  . Bipolar disorder ([place]) 05/04/2012    Past Surgical History:  Procedure Laterality Date  . ABDOMINAL HYSTERECTOMY    . APPENDECTOMY    . CESAREAN SECTION    . CHOLECYSTECTOMY    . EYE SURGERY    . KNEE SURGERY      OB History    No data available       Home Medications    Prior to Admission medications   Medication Sig Start Date End Date Taking? Authorizing Provider  acetaminophen (TYLENOL) 500 MG tablet Take 1 tablet (500 mg total) by mouth every 6 (six) hours as needed. Patient taking differently: Take 500 mg by mouth every 6 (six) hours as needed for mild pain.  01/10/17  Yes Horton, Barbette Hair, MD   albuterol (PROVENTIL HFA;VENTOLIN HFA) 108 (90 Base) MCG/ACT inhaler Inhale 2 puffs into the lungs every 6 (six) hours as needed for wheezing or shortness of breath. Patient not taking: Reported on 10/02/2016 05/11/16   Rudene Re, MD  cephALEXin (KEFLEX) 500 MG capsule Take 1 capsule (500 mg total) by mouth 3 (three) times daily. Patient not taking: Reported on 09/08/2016 07/16/16   Waynetta Pean, PA-C  dicyclomine (BENTYL) 20 MG tablet Take 1 tablet (20 mg total) by mouth 3 (three) times daily as needed for spasms. 03/19/17   Earleen Newport, MD  divalproex (DEPAKOTE ER) 500 MG 24 hr tablet Take 1 tablet (500 mg total) by mouth 2 (two) times daily. 03/04/17   Nance Pear, MD  doxycycline (VIBRAMYCIN) 100 MG capsule Take 1 capsule (100 mg total) by mouth 2 (two) times daily. Patient not taking: Reported on 10/02/2016 09/08/16   Ocie Cornfield T, PA-C  fluticasone [facility]) 50 MCG/ACT nasal spray Place 2 sprays into both nostrils daily. Patient not taking: Reported on 02/26/2017 01/10/17   Horton, Barbette Hair, MD  insulin aspart protamine- aspart (NOVOLOG MIX 70/30) (70-30) 100 UNIT/ML injection Inject 0.1 mLs (10 Units total) into the skin 2 (two) times daily with a meal. Reported on 04/01/2015 03/04/17   Nance Pear, MD  lisinopril (PRINIVIL,ZESTRIL) 20 MG tablet  Take 1 tablet (20 mg total) by mouth daily. 03/04/17 03/04/18  Nance Pear, MD  lurasidone (LATUDA) 20 MG TABS tablet Take 1 tablet (20 mg total) by mouth daily. 03/04/17   Nance Pear, MD  metFORMIN (GLUCOPHAGE) 500 MG tablet Take 2 tablets (1,000 mg total) by mouth 2 (two) times daily with a meal. 03/04/17   Nance Pear, MD  neomycin-polymyxin-hydrocortisone (CORTISPORIN) 3.5-10000-1 OTIC suspension Place 3 drops into the right ear 3 (three) times daily. Patient not taking: Reported on 02/26/2017 01/10/17   Horton, Barbette Hair, MD  nitroGLYCERIN (NITROSTAT) 0.4 MG SL tablet Place 1 tablet (0.4 mg total)  under the tongue every 5 (five) minutes as needed for chest pain. Patient not taking: Reported on 02/26/2017 05/11/16 05/11/17  Rudene Re, MD  ondansetron (ZOFRAN ODT) 4 MG disintegrating tablet Take 1 tablet (4 mg total) by mouth every 8 (eight) hours as needed for nausea or vomiting. 03/19/17   Earleen Newport, MD  ondansetron (ZOFRAN) 4 MG tablet Take 1 tablet (4 mg total) by mouth every 8 (eight) hours as needed for nausea or vomiting. Patient not taking: Reported on 09/08/2016 05/11/16   Rudene Re, MD  pramoxine (PROCTOFOAM) 1 % foam Place 1 application rectally 3 (three) times daily as needed for hemorrhoids. 02/26/17   Carrie Mew, MD  predniSONE (DELTASONE) 50 MG tablet Take one 50 mg tablet once a day for 5 days. 03/16/17   Lannie Fields, PA-C  senna-docusate (SENOKOT-S) 8.6-50 MG tablet Take 2 tablets by mouth 2 (two) times daily. 02/26/17   Carrie Mew, MD    Family History Family History  Problem Relation Age of Onset  . Cancer Father     Social History Social History   Tobacco Use  . Smoking status: Current Every Day Smoker    Packs/day: 0.50    Years: 30.00    Pack years: 15.00    Types: Cigarettes  . Smokeless tobacco: Never Used  Substance Use Topics  . Alcohol use: No  . Drug use: No    Comment: Prior Hx crack cocaine abuse.     Allergies   Aspirin; Bee venom; Other; Naproxen; Toradol [ketorolac tromethamine]; Penicillins; Tramadol; and Vancomycin   Review of Systems Review of Systems  All other systems reviewed and are negative.    Physical Exam Updated Vital Signs BP (!) 109/55   Pulse 81   Temp 98.1 F (36.7 C) (Oral)   Resp (!) 23   SpO2 95%   Physical Exam  Constitutional: She is oriented to person, place, and time. She appears well-developed and well-nourished.  HENT:  Head: Normocephalic and atraumatic.  Eyes: Conjunctivae are normal.  Neck: Neck supple.  Cardiovascular: Normal rate and regular rhythm.    Pulmonary/Chest: Effort normal and breath sounds normal.  Abdominal: Soft. Bowel sounds are normal.  Musculoskeletal: Normal range of motion.  Neurological: She is alert and oriented to person, place, and time.  Skin: Skin is warm and dry.  Psychiatric: She has a normal mood and affect. Her behavior is normal.  Nursing note and vitals reviewed.    ED Treatments / Results  Labs (all labs ordered are listed, but only abnormal results are displayed) Labs Reviewed  BASIC METABOLIC PANEL - Abnormal; Notable for the following components:      Result Value   Chloride 100 (*)    Glucose, Bld 399 (*)    All other components within normal limits  CBC  I-STAT TROPONIN, ED  I-STAT TROPONIN, ED  EKG  EKG Interpretation  Date/Time:  Saturday April 10 2017 19:57:54 EST Ventricular Rate:  83 PR Interval:    QRS Duration: 94 QT Interval:  370 QTC Calculation: 435 R Axis:   91 Text Interpretation:  Sinus rhythm Borderline right axis deviation Confirmed by Nat Christen [phone]) on 04/10/2017 8:14:27 PM       Radiology Dg Chest 2 View  Result Date: 04/10/2017 CLINICAL DATA:  [place], as pt is homeless. Onset 8 hours PTA that is worsening. Per EMS pt was anxious and hyperventilating upon arrival. EKG showed initial ST, changing to NSR after pt calmed down. EMS gave ASA .*comment was truncated* EXAM: CHEST  2 VIEW COMPARISON:  10/20/2016 FINDINGS: Normal mediastinum and cardiac silhouette. Normal pulmonary vasculature. No evidence of effusion, infiltrate, or pneumothorax. No acute bony abnormality. IMPRESSION: No acute cardiopulmonary process. Electronically Signed   By: Suzy Bouchard M.D.   On: 04/10/2017 20:57    Procedures Procedures (including critical care time)  Medications Ordered in ED Medications  morphine 4 MG/ML injection 2 mg (2 mg Intravenous Given 04/10/17 2037)  sodium chloride 0.9 % bolus 1,000 mL (0 mLs Intravenous Stopped 04/10/17 2232)   morphine 4 MG/ML injection 2 mg (2 mg Intravenous Given 04/10/17 2233)  insulin aspart (novoLOG) injection 4 Units (4 Units Intravenous Given 04/10/17 2315)     Initial Impression / Assessment and Plan / ED Course  I have reviewed the triage vital signs and the nursing notes.  Pertinent labs & imaging results that were available during my care of the patient were reviewed by me and considered in my medical decision making (see chart for details).     Patient with cardiac risk factors presents with chest pain.  Initial EKG and delta troponins were negative.  Patient will need primary care/cardiology follow-up.  Final Clinical Impressions(s) / ED Diagnoses   Final diagnoses:  Chest pain, unspecified type  Hyperglycemia    ED Discharge Orders    None       Nat Christen, MD 04/10/17 2313    Nat Christen, MD 04/11/17 0003

## 2017-04-11 NOTE — Discharge Instructions (Signed)
You must get a primary care relationship and take your medicine as prescribed.  EKG and tests for a heart attack were normal.  Avoid drugs, alcohol, cigarettes.

## 2017-04-18 ENCOUNTER — Emergency Department (HOSPITAL_COMMUNITY)
Admission: EM | Admit: 2017-04-18 | Discharge: 2017-04-18 | Disposition: A | Discharge status: Home / Self Care | Payer: Medicaid Other | Attending: Emergency Medicine | Admitting: Emergency Medicine

## 2017-04-18 ENCOUNTER — Other Ambulatory Visit: Payer: Self-pay

## 2017-04-18 DIAGNOSIS — R51 Headache: Secondary | ICD-10-CM | POA: Insufficient documentation

## 2017-04-18 DIAGNOSIS — Z590 Homelessness: Secondary | ICD-10-CM | POA: Insufficient documentation

## 2017-04-18 DIAGNOSIS — Z794 Long term (current) use of insulin: Secondary | ICD-10-CM | POA: Insufficient documentation

## 2017-04-18 DIAGNOSIS — I10 Essential (primary) hypertension: Secondary | ICD-10-CM | POA: Insufficient documentation

## 2017-04-18 DIAGNOSIS — R569 Unspecified convulsions: Secondary | ICD-10-CM | POA: Diagnosis present

## 2017-04-18 DIAGNOSIS — R739 Hyperglycemia, unspecified: Secondary | ICD-10-CM

## 2017-04-18 DIAGNOSIS — F17210 Nicotine dependence, cigarettes, uncomplicated: Secondary | ICD-10-CM | POA: Insufficient documentation

## 2017-04-18 DIAGNOSIS — F319 Bipolar disorder, unspecified: Secondary | ICD-10-CM | POA: Diagnosis not present

## 2017-04-18 DIAGNOSIS — J449 Chronic obstructive pulmonary disease, unspecified: Secondary | ICD-10-CM | POA: Insufficient documentation

## 2017-04-18 DIAGNOSIS — E1165 Type 2 diabetes mellitus with hyperglycemia: Secondary | ICD-10-CM | POA: Diagnosis not present

## 2017-04-18 LAB — CBC WITH DIFFERENTIAL/PLATELET
Basophils Absolute: 0 10*3/uL (ref 0.0–0.1)
Basophils Relative: 0 %
Eosinophils Absolute: 0.1 10*3/uL (ref 0.0–0.7)
Eosinophils Relative: 2 %
HCT: 40.8 % (ref 36.0–46.0)
Hemoglobin: 13.8 g/dL (ref 12.0–15.0)
Lymphocytes Relative: 45 %
Lymphs Abs: 2.9 10*3/uL (ref 0.7–4.0)
MCH: 30.5 pg (ref 26.0–34.0)
MCHC: 33.8 g/dL (ref 30.0–36.0)
MCV: 90.1 fL (ref 78.0–100.0)
Monocytes Absolute: 0.5 10*3/uL (ref 0.1–1.0)
Monocytes Relative: 8 %
Neutro Abs: 2.9 10*3/uL (ref 1.7–7.7)
Neutrophils Relative %: 45 %
Platelets: 94 10*3/uL — ABNORMAL LOW (ref 150–400)
RBC: 4.53 MIL/uL (ref 3.87–5.11)
RDW: 12.8 % (ref 11.5–15.5)
WBC: 6.4 10*3/uL (ref 4.0–10.5)

## 2017-04-18 LAB — I-STAT BETA HCG BLOOD, ED (MC, WL, AP ONLY): HCG, QUANTITATIVE: 5.5 m[IU]/mL — AB (ref <5)

## 2017-04-18 LAB — BASIC METABOLIC PANEL
Anion gap: 10 (ref 5–15)
BUN: 11 mg/dL (ref 6–20)
CO2: 25 mmol/L (ref 22–32)
Calcium: 9 mg/dL (ref 8.9–10.3)
Chloride: 99 mmol/L — ABNORMAL LOW (ref 101–111)
Creatinine, Ser: 0.74 mg/dL (ref 0.44–1.00)
GFR calc Af Amer: >60 mL/min (ref >60)
GFR calc non Af Amer: >60 mL/min (ref >60)
Glucose, Bld: 424 mg/dL — ABNORMAL HIGH (ref 65–99)
Potassium: 5.4 mmol/L — ABNORMAL HIGH (ref 3.5–5.1)
Sodium: 134 mmol/L — ABNORMAL LOW (ref 135–145)

## 2017-04-18 LAB — VALPROIC ACID LEVEL

## 2017-04-18 LAB — CBG MONITORING, ED
GLUCOSE-CAPILLARY: 262 mg/dL — AB (ref 65–99)
Glucose-Capillary: 435 mg/dL — ABNORMAL HIGH (ref 65–99)

## 2017-04-18 MED ORDER — INSULIN ASPART 100 UNIT/ML ~~LOC~~ SOLN
10.0000 [IU] | Freq: Once | SUBCUTANEOUS | Status: AC
  Administered 2017-04-18: 10 [IU] via INTRAVENOUS
  Filled 2017-04-18: qty 1

## 2017-04-18 MED ORDER — DIVALPROEX SODIUM 250 MG PO DR TAB: 250.0000 mg | DELAYED_RELEASE_TABLET | Freq: Once | ORAL | Status: DC

## 2017-04-18 MED ORDER — SODIUM CHLORIDE 0.9 % IV BOLUS (SEPSIS)
1000.0000 mL | Freq: Once | INTRAVENOUS | Status: AC
  Administered 2017-04-18: 1000 mL via INTRAVENOUS

## 2017-04-18 MED ORDER — DIVALPROEX SODIUM 500 MG PO DR TAB
500.0000 mg | DELAYED_RELEASE_TABLET | Freq: Once | ORAL | Status: AC
  Administered 2017-04-18: 500 mg via ORAL
  Filled 2017-04-18: qty 1

## 2017-04-18 MED ORDER — DIPHENHYDRAMINE HCL 50 MG/ML IJ SOLN
12.5000 mg | Freq: Once | INTRAMUSCULAR | Status: AC
  Administered 2017-04-18: 12.5 mg via INTRAVENOUS
  Filled 2017-04-18: qty 1

## 2017-04-18 MED ORDER — DIVALPROEX SODIUM ER 500 MG PO TB24: 500.0000 mg | ORAL_TABLET | Freq: Two times a day (BID) | ORAL | 1 refills | Status: AC

## 2017-04-18 MED ORDER — METOCLOPRAMIDE HCL 5 MG/ML IJ SOLN
10.0000 mg | Freq: Once | INTRAMUSCULAR | Status: AC
  Administered 2017-04-18: 10 mg via INTRAVENOUS
  Filled 2017-04-18: qty 2

## 2017-04-18 NOTE — ED Triage Notes (Signed)
Per EMS: Pt had a witnessed seizure per family. Unknown length of seizure. Pt has been confused with EMS. Pt is homeless and the person who witnessed the seizure was a first cousin who is also homeless.  Pt has a 20g in the Left [facility]

## 2017-04-18 NOTE — ED Notes (Signed)
CBG not crossing over.  CBG 453

## 2017-04-18 NOTE — ED Notes (Signed)
Bed: FS14 Expected date: 04/18/17 Expected time: 3:53 PM Means of arrival: Ambulance Comments: Seizure

## 2017-04-18 NOTE — ED Provider Notes (Signed)
[place] DEPT Provider Note   CSN: [id number] Arrival date & time: 04/18/17  1555     History   Chief Complaint Chief Complaint  Patient presents with  . Seizures    HPI Carol Hughes is a 52 y.o. female.  HPI   52 year old female with reported seizure.  Patient herself is amnestic to events.  She does have a seizure history.  She is prescribed Depakote.  She states that she is out.  She reports last dose was 3 days ago.  Currently complaining of a mild headache.  No other acute complaints otherwise.  Patient has an off affect and is talking in a childlike voice. Denies etoh or drug use.   Past Medical History:  Diagnosis Date  . Addiction to drug ([place])   . Cancer (HCC) throat  . COPD (chronic obstructive pulmonary disease) ([place])   . Coronary artery disease   . Diabetes mellitus without complication ([place])   . Hypertension   . Migraine   . Obesity   . Schizophrenia ([place])   . Seizures ([place])   . Sleep apnea   . Throat cancer [facility])     Patient Active Problem List   Diagnosis Date Noted  . Mastitis in female 07/05/2012  . Dehydration 07/05/2012  . Seizure disorder ([place]) 07/05/2012  . Chest pain 05/04/2012  . COPD (chronic obstructive pulmonary disease) ([place]) 05/04/2012  . Diabetes mellitus, type 2 ([place]) 05/04/2012  . Bipolar disorder ([place]) 05/04/2012    Past Surgical History:  Procedure Laterality Date  . ABDOMINAL HYSTERECTOMY    . APPENDECTOMY    . CESAREAN SECTION    . CHOLECYSTECTOMY    . EYE SURGERY    . KNEE SURGERY      OB History    No data available       Home Medications    Prior to Admission medications   Medication Sig Start Date End Date Taking? Authorizing Provider  acetaminophen (TYLENOL) 500 MG tablet Take 1 tablet (500 mg total) by mouth every 6 (six) hours as needed. Patient taking differently: Take 500 mg by mouth every 6 (six) hours as needed for mild pain.  01/10/17   Horton, Barbette Hair, MD    albuterol (PROVENTIL HFA;VENTOLIN HFA) 108 (90 Base) MCG/ACT inhaler Inhale 2 puffs into the lungs every 6 (six) hours as needed for wheezing or shortness of breath. Patient not taking: Reported on 10/02/2016 05/11/16   Rudene Re, MD  cephALEXin (KEFLEX) 500 MG capsule Take 1 capsule (500 mg total) by mouth 3 (three) times daily. Patient not taking: Reported on 09/08/2016 07/16/16   Waynetta Pean, PA-C  dicyclomine (BENTYL) 20 MG tablet Take 1 tablet (20 mg total) by mouth 3 (three) times daily as needed for spasms. 03/19/17   Earleen Newport, MD  divalproex (DEPAKOTE ER) 500 MG 24 hr tablet Take 1 tablet (500 mg total) by mouth 2 (two) times daily. 03/04/17   Nance Pear, MD  doxycycline (VIBRAMYCIN) 100 MG capsule Take 1 capsule (100 mg total) by mouth 2 (two) times daily. Patient not taking: Reported on 10/02/2016 09/08/16   Ocie Cornfield T, PA-C  fluticasone [facility]) 50 MCG/ACT nasal spray Place 2 sprays into both nostrils daily. Patient not taking: Reported on 02/26/2017 01/10/17   Horton, Barbette Hair, MD  insulin aspart protamine- aspart (NOVOLOG MIX 70/30) (70-30) 100 UNIT/ML injection Inject 0.1 mLs (10 Units total) into the skin 2 (two) times daily with a meal. Reported on 04/01/2015 03/04/17  Nance Pear, MD  lisinopril (PRINIVIL,ZESTRIL) 20 MG tablet Take 1 tablet (20 mg total) by mouth daily. 03/04/17 03/04/18  Nance Pear, MD  lurasidone (LATUDA) 20 MG TABS tablet Take 1 tablet (20 mg total) by mouth daily. 03/04/17   Nance Pear, MD  metFORMIN (GLUCOPHAGE) 500 MG tablet Take 2 tablets (1,000 mg total) by mouth 2 (two) times daily with a meal. 03/04/17   Nance Pear, MD  neomycin-polymyxin-hydrocortisone (CORTISPORIN) 3.5-10000-1 OTIC suspension Place 3 drops into the right ear 3 (three) times daily. Patient not taking: Reported on 02/26/2017 01/10/17   Horton, Barbette Hair, MD  nitroGLYCERIN (NITROSTAT) 0.4 MG SL tablet Place 1 tablet (0.4 mg total)  under the tongue every 5 (five) minutes as needed for chest pain. Patient not taking: Reported on 02/26/2017 05/11/16 05/11/17  Rudene Re, MD  ondansetron (ZOFRAN ODT) 4 MG disintegrating tablet Take 1 tablet (4 mg total) by mouth every 8 (eight) hours as needed for nausea or vomiting. 03/19/17   Earleen Newport, MD  ondansetron (ZOFRAN) 4 MG tablet Take 1 tablet (4 mg total) by mouth every 8 (eight) hours as needed for nausea or vomiting. Patient not taking: Reported on 09/08/2016 05/11/16   Rudene Re, MD  pramoxine (PROCTOFOAM) 1 % foam Place 1 application rectally 3 (three) times daily as needed for hemorrhoids. 02/26/17   Carrie Mew, MD  predniSONE (DELTASONE) 50 MG tablet Take one 50 mg tablet once a day for 5 days. 03/16/17   Lannie Fields, PA-C  senna-docusate (SENOKOT-S) 8.6-50 MG tablet Take 2 tablets by mouth 2 (two) times daily. 02/26/17   Carrie Mew, MD    Family History Family History  Problem Relation Age of Onset  . Cancer Father     Social History Social History   Tobacco Use  . Smoking status: Current Every Day Smoker    Packs/day: 0.50    Years: 30.00    Pack years: 15.00    Types: Cigarettes  . Smokeless tobacco: Never Used  Substance Use Topics  . Alcohol use: No  . Drug use: No    Comment: Prior Hx crack cocaine abuse.     Allergies   Aspirin; Bee venom; Other; Naproxen; Toradol [ketorolac tromethamine]; Penicillins; Tramadol; and Vancomycin   Review of Systems Review of Systems  All systems reviewed and negative, other than as noted in HPI.   Physical Exam Updated Vital Signs BP 121/66 (BP Location: Left Arm)   Pulse 68   Temp 98.4 F (36.9 C) (Oral)   Resp 19   Ht 5\' 2"  (1.575 m)   Wt 90.7 kg (200 lb)   SpO2 100%   BMI 36.58 kg/m   Physical Exam  Constitutional: She is oriented to person, place, and time. She appears well-developed and well-nourished. No distress.  HENT:  Head: Normocephalic and  atraumatic.  Eyes: Conjunctivae are normal. Pupils are equal, round, and reactive to light. Right eye exhibits no discharge. Left eye exhibits no discharge.  Neck: Neck supple.  Cardiovascular: Normal rate, regular rhythm and normal heart sounds. Exam reveals no gallop and no friction rub.  No murmur heard. Pulmonary/Chest: Effort normal and breath sounds normal. No respiratory distress.  Abdominal: Soft. She exhibits no distension. There is no tenderness.  Musculoskeletal: She exhibits no edema or tenderness.  Neurological: She is alert and oriented to person, place, and time. No cranial nerve deficit. She exhibits normal muscle tone. Coordination normal.  Skin: Skin is warm and dry.  Psychiatric:  Patient is talking  in a childlike voice but content of her speech is appropriate.  She is following commands.  No focal deficits noted.  Nursing note and vitals reviewed.    ED Treatments / Results  Labs (all labs ordered are listed, but only abnormal results are displayed) Labs Reviewed  BASIC METABOLIC PANEL - Abnormal; Notable for the following components:      Result Value   Sodium 134 (*)    Potassium 5.4 (*)    Chloride 99 (*)    Glucose, Bld 424 (*)    All other components within normal limits  CBG MONITORING, ED - Abnormal; Notable for the following components:   Glucose-Capillary 435 (*)    All other components within normal limits  I-STAT BETA HCG BLOOD, ED (MC, [facility], AP ONLY) - Abnormal; Notable for the following components:   I-stat hCG, quantitative 5.5 (*)    All other components within normal limits  CBC WITH DIFFERENTIAL/PLATELET  VALPROIC ACID LEVEL    EKG  EKG Interpretation None       Radiology No results found.  Procedures Procedures (including critical care time)  Medications Ordered in ED Medications  divalproex (DEPAKOTE) DR tablet 500 mg (not administered)  diphenhydrAMINE (BENADRYL) injection 12.5 mg (12.5 mg Intravenous Given 04/18/17 1811)    metoCLOPramide (REGLAN) injection 10 mg (10 mg Intravenous Given 04/18/17 1811)  sodium chloride 0.9 % bolus 1,000 mL (1,000 mLs Intravenous New Bag/Given 04/18/17 1810)  insulin aspart (novoLOG) injection 10 Units (10 Units Intravenous Given 04/18/17 1810)     Initial Impression / Assessment and Plan / ED Course  I have reviewed the triage vital signs and the nursing notes.  Pertinent labs & imaging results that were available during my care of the patient were reviewed by me and considered in my medical decision making (see chart for details).     52 year old female with reported seizure.  She does not affect but neuro exam is otherwise nonfocal.  Current behaviors more consistent with current behavior is atypical for postictal state.  She is hyperglycemic but not acidotic.  IV fluids and insulin.  We will give her a dose of Depakote.  Seizure likely precipitated by noncompliance.  Final Clinical Impressions(s) / ED Diagnoses   Final diagnoses:  Seizure [facility])  Hyperglycemia    ED Discharge Orders    None       Virgel Manifold, MD 04/18/17 2313

## 2017-05-06 ENCOUNTER — Emergency Department (HOSPITAL_COMMUNITY)
Admission: EM | Admit: 2017-05-06 | Discharge: 2017-05-06 | Disposition: A | Discharge status: Home / Self Care | Payer: Medicaid Other | Attending: Emergency Medicine | Admitting: Emergency Medicine

## 2017-05-06 ENCOUNTER — Other Ambulatory Visit: Payer: Self-pay

## 2017-05-06 ENCOUNTER — Encounter (HOSPITAL_COMMUNITY): Payer: Self-pay

## 2017-05-06 DIAGNOSIS — R51 Headache: Secondary | ICD-10-CM | POA: Insufficient documentation

## 2017-05-06 DIAGNOSIS — Z5321 Procedure and treatment not carried out due to patient leaving prior to being seen by health care provider: Secondary | ICD-10-CM | POA: Insufficient documentation
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
## 2017-05-06 NOTE — ED Notes (Signed)
Pt ambulated out to the lobby with her belongings and without needing assistance to wait on a room.

## 2017-05-06 NOTE — ED Notes (Signed)
PT did not response to name. Unable to be seen by MD
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
## 2017-05-31 ENCOUNTER — Encounter (HOSPITAL_COMMUNITY): Payer: Self-pay | Admitting: Emergency Medicine

## 2017-05-31 ENCOUNTER — Emergency Department (HOSPITAL_COMMUNITY)
Admission: EM | Admit: 2017-05-31 | Discharge: 2017-05-31 | Disposition: A | Discharge status: Home / Self Care | Payer: Medicaid Other | Attending: Emergency Medicine | Admitting: Emergency Medicine

## 2017-05-31 ENCOUNTER — Other Ambulatory Visit: Payer: Self-pay

## 2017-05-31 DIAGNOSIS — Z5321 Procedure and treatment not carried out due to patient leaving prior to being seen by health care provider: Secondary | ICD-10-CM | POA: Diagnosis not present

## 2017-05-31 DIAGNOSIS — K0889 Other specified disorders of teeth and supporting structures: Secondary | ICD-10-CM | POA: Insufficient documentation

## 2017-05-31 LAB — CBC
HCT: 47.8 % — ABNORMAL HIGH (ref 36.0–46.0)
HEMOGLOBIN: 15.8 g/dL — AB (ref 12.0–15.0)
MCH: 29.8 pg (ref 26.0–34.0)
MCHC: 33.1 g/dL (ref 30.0–36.0)
MCV: 90.2 fL (ref 78.0–100.0)
PLATELETS: 190 10*3/uL (ref 150–400)
RBC: 5.3 MIL/uL — AB (ref 3.87–5.11)
RDW: 12.6 % (ref 11.5–15.5)
WBC: 7.9 10*3/uL (ref 4.0–10.5)

## 2017-05-31 LAB — URINALYSIS, ROUTINE W REFLEX MICROSCOPIC
Bacteria, UA: NONE SEEN
Bilirubin Urine: NEGATIVE
KETONES UR: NEGATIVE mg/dL
LEUKOCYTES UA: NEGATIVE
Nitrite: NEGATIVE
PROTEIN: NEGATIVE mg/dL
Specific Gravity, Urine: 1.025 (ref 1.005–1.030)
pH: 6 (ref 5.0–8.0)

## 2017-05-31 LAB — BASIC METABOLIC PANEL
ANION GAP: 12 (ref 5–15)
BUN: 29 mg/dL — ABNORMAL HIGH (ref 6–20)
CHLORIDE: 95 mmol/L — AB (ref 101–111)
CO2: 24 mmol/L (ref 22–32)
Calcium: 10.2 mg/dL (ref 8.9–10.3)
Creatinine, Ser: 0.9 mg/dL (ref 0.44–1.00)
GFR calc Af Amer: >60 mL/min (ref >60)
Glucose, Bld: 429 mg/dL — ABNORMAL HIGH (ref 65–99)
POTASSIUM: 4.6 mmol/L (ref 3.5–5.1)
SODIUM: 131 mmol/L — AB (ref 135–145)

## 2017-05-31 LAB — CBG MONITORING, ED: Glucose-Capillary: 426 mg/dL — ABNORMAL HIGH (ref 65–99)

## 2017-05-31 NOTE — ED Notes (Signed)
Called no answer

## 2017-05-31 NOTE — ED Triage Notes (Signed)
Pt c/o headache and states her blood sugar was 450 ath ome.

## 2017-06-08 ENCOUNTER — Emergency Department (HOSPITAL_COMMUNITY)
Admission: EM | Admit: 2017-06-08 | Discharge: 2017-06-08 | Disposition: A | Discharge status: Home / Self Care | Payer: Medicaid Other | Attending: Emergency Medicine | Admitting: Emergency Medicine

## 2017-06-08 ENCOUNTER — Encounter (HOSPITAL_COMMUNITY): Payer: Self-pay | Admitting: *Deleted

## 2017-06-08 ENCOUNTER — Other Ambulatory Visit: Payer: Self-pay

## 2017-06-08 DIAGNOSIS — F17210 Nicotine dependence, cigarettes, uncomplicated: Secondary | ICD-10-CM | POA: Diagnosis not present

## 2017-06-08 DIAGNOSIS — J449 Chronic obstructive pulmonary disease, unspecified: Secondary | ICD-10-CM | POA: Diagnosis not present

## 2017-06-08 DIAGNOSIS — R51 Headache: Secondary | ICD-10-CM | POA: Insufficient documentation

## 2017-06-08 DIAGNOSIS — R519 Headache, unspecified: Secondary | ICD-10-CM

## 2017-06-08 DIAGNOSIS — I259 Chronic ischemic heart disease, unspecified: Secondary | ICD-10-CM | POA: Diagnosis not present

## 2017-06-08 DIAGNOSIS — E119 Type 2 diabetes mellitus without complications: Secondary | ICD-10-CM | POA: Insufficient documentation

## 2017-06-08 DIAGNOSIS — R42 Dizziness and giddiness: Secondary | ICD-10-CM

## 2017-06-08 DIAGNOSIS — Z794 Long term (current) use of insulin: Secondary | ICD-10-CM | POA: Insufficient documentation

## 2017-06-08 HISTORY — DX: Other sequelae of cerebral infarction: I69.398

## 2017-06-08 HISTORY — DX: Dizziness and giddiness: R42

## 2017-06-08 MED ORDER — ACETAMINOPHEN 500 MG PO TABS
1000.0000 mg | ORAL_TABLET | Freq: Once | ORAL | Status: AC
  Administered 2017-06-08: 1000 mg via ORAL
  Filled 2017-06-08: qty 2

## 2017-06-08 MED ORDER — MECLIZINE HCL 12.5 MG PO TABS
25.0000 mg | ORAL_TABLET | Freq: Once | ORAL | Status: AC
  Administered 2017-06-08: 25 mg via ORAL
  Filled 2017-06-08: qty 2

## 2017-06-08 MED ORDER — MECLIZINE HCL 25 MG PO TABS: 25.0000 mg | ORAL_TABLET | Freq: Three times a day (TID) | ORAL | 0 refills | Status: AC | PRN

## 2017-06-08 NOTE — ED Notes (Signed)
Upon entering Pts room, Pt vocalized her dissatisfaction with care and voiced she was ready to leave. When told the she had medications to receive, she verbalized "tylenol is fucking candy to me, and that's all you're gonna give me?" RN told Pt that they are sorry the Pt feels that way, but 1000mg  of Tylenol is a strong dose for her. PA, [employer].

## 2017-06-08 NOTE — ED Triage Notes (Signed)
Pt brought in by [facility] for c/o syncopal episode; when ems arrived pt was found to be alert and oriented; pt is alert and oriented at this time just c/o severe headache and sensitivity to lights

## 2017-06-08 NOTE — Discharge Instructions (Signed)
Please use Antivert 3 times daily for dizziness.  This medication may cause drowsiness, please use it with caution.  Please see your primary physician or return to the emergency department if not improving.

## 2017-06-08 NOTE — ED Notes (Signed)
Pt standing in the doorway of her room and pacing back and forth. Pt verbalizing she is "about to leave without her paperwork" "where is that paperwork?"

## 2017-06-08 NOTE — ED Notes (Signed)
Pt laying on her right side in bed stating she is unable to sit-up or stand if she had to use the restroom. Pt states she needs surgery on her right knee and  It is painful for her to get around by herself.
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
## 2017-07-14 ENCOUNTER — Emergency Department (HOSPITAL_COMMUNITY)
Admission: EM | Admit: 2017-07-14 | Discharge: 2017-07-14 | Disposition: A | Discharge status: Home / Self Care | Payer: Medicaid Other | Attending: Emergency Medicine | Admitting: Emergency Medicine

## 2017-07-14 ENCOUNTER — Encounter (HOSPITAL_COMMUNITY): Payer: Self-pay | Admitting: Emergency Medicine

## 2017-07-14 ENCOUNTER — Other Ambulatory Visit: Payer: Self-pay

## 2017-07-14 DIAGNOSIS — Z794 Long term (current) use of insulin: Secondary | ICD-10-CM | POA: Insufficient documentation

## 2017-07-14 DIAGNOSIS — Z79899 Other long term (current) drug therapy: Secondary | ICD-10-CM | POA: Insufficient documentation

## 2017-07-14 DIAGNOSIS — Y929 Unspecified place or not applicable: Secondary | ICD-10-CM | POA: Insufficient documentation

## 2017-07-14 DIAGNOSIS — E119 Type 2 diabetes mellitus without complications: Secondary | ICD-10-CM | POA: Insufficient documentation

## 2017-07-14 DIAGNOSIS — Z23 Encounter for immunization: Secondary | ICD-10-CM | POA: Insufficient documentation

## 2017-07-14 DIAGNOSIS — I10 Essential (primary) hypertension: Secondary | ICD-10-CM | POA: Insufficient documentation

## 2017-07-14 DIAGNOSIS — S0101XA Laceration without foreign body of scalp, initial encounter: Secondary | ICD-10-CM | POA: Insufficient documentation

## 2017-07-14 DIAGNOSIS — F17210 Nicotine dependence, cigarettes, uncomplicated: Secondary | ICD-10-CM | POA: Diagnosis not present

## 2017-07-14 DIAGNOSIS — Y999 Unspecified external cause status: Secondary | ICD-10-CM | POA: Insufficient documentation

## 2017-07-14 DIAGNOSIS — Z85818 Personal history of malignant neoplasm of other sites of lip, oral cavity, and pharynx: Secondary | ICD-10-CM | POA: Diagnosis not present

## 2017-07-14 DIAGNOSIS — I2510 Atherosclerotic heart disease of native coronary artery without angina pectoris: Secondary | ICD-10-CM | POA: Insufficient documentation

## 2017-07-14 DIAGNOSIS — J449 Chronic obstructive pulmonary disease, unspecified: Secondary | ICD-10-CM | POA: Insufficient documentation

## 2017-07-14 DIAGNOSIS — Y939 Activity, unspecified: Secondary | ICD-10-CM | POA: Diagnosis not present

## 2017-07-14 DIAGNOSIS — W268XXA Contact with other sharp object(s), not elsewhere classified, initial encounter: Secondary | ICD-10-CM | POA: Insufficient documentation

## 2017-07-14 MED ORDER — TETANUS-DIPHTH-ACELL PERTUSSIS 5-2.5-18.5 LF-MCG/0.5 IM SUSP
0.5000 mL | Freq: Once | INTRAMUSCULAR | Status: AC
  Administered 2017-07-14: 0.5 mL via INTRAMUSCULAR
  Filled 2017-07-14: qty 0.5

## 2017-07-14 MED ORDER — LIDOCAINE-EPINEPHRINE-TETRACAINE (LET) SOLUTION
3.0000 mL | Freq: Once | NASAL | Status: AC
  Administered 2017-07-14: 3 mL via TOPICAL
  Filled 2017-07-14: qty 3

## 2017-07-14 MED ORDER — LIDOCAINE 2 % EX GEL: 1.0000 "application " | Freq: Two times a day (BID) | CUTANEOUS | 0 refills | Status: AC
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
## 2017-07-14 NOTE — ED Provider Notes (Signed)
[facility] DEPT Provider Note   CSN: [id number] Arrival date & time: 07/14/17  1331     History   Chief Complaint Chief Complaint  Patient presents with  . Head Laceration    HPI Carol Hughes is a 52 y.o. female presenting for evaluation of head laceration.  Patient states she cut her head on razor wire last night.  There was bleeding which was controlled with pressure and ice.  She is taking Tylenol and ibuprofen, but reports continued pain.  She does not think her tetanus is up-to-date.  No active bleeding at this time.  She reports pain at the site of the laceration that comes into the front of her head.  She reports dizziness, although states this feels similar to the vertigo that she has had prior to the injury, and has not yet picked up the meclizine for.  She denies vision changes, slurred speech, vomiting.  She is not on blood thinners.  She is not immunocompromised.  HPI  Past Medical History:  Diagnosis Date  . Addiction to drug ([place])   . Cancer (HCC) throat  . COPD (chronic obstructive pulmonary disease) ([place])   . Coronary artery disease   . Diabetes mellitus without complication ([place])   . Hypertension   . Migraine   . Obesity   . Schizophrenia ([place])   . Seizures ([place])   . Sleep apnea   . Throat cancer ([place])   . 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 Mastitis in female 07/05/2012  . Dehydration 07/05/2012  . Seizure disorder ([place]) 07/05/2012  . Chest pain 05/04/2012  . COPD (chronic obstructive pulmonary disease) ([place]) 05/04/2012  . Diabetes mellitus, type 2 ([place]) 05/04/2012  . Bipolar disorder ([place]) 05/04/2012    Past Surgical History:  Procedure Laterality Date  . ABDOMINAL HYSTERECTOMY    . APPENDECTOMY    . CESAREAN SECTION    . CHOLECYSTECTOMY    . EYE SURGERY    . KNEE SURGERY       OB History   None      Home  Medications    Prior to Admission medications   Medication Sig Start Date End Date Taking? Authorizing Provider  acetaminophen (TYLENOL) 500 MG tablet Take 1 tablet (500 mg total) by mouth every 6 (six) hours as needed. Patient taking differently: Take 500 mg by mouth every 6 (six) hours as needed for mild pain.  01/10/17   Horton, Barbette Hair, MD  albuterol (PROVENTIL HFA;VENTOLIN HFA) 108 (90 Base) MCG/ACT inhaler Inhale 2 puffs into the lungs every 6 (six) hours as needed for wheezing or shortness of breath. Patient not taking: Reported on 10/02/2016 05/11/16   Rudene Re, MD  cephALEXin (KEFLEX) 500 MG capsule Take 1 capsule (500 mg total) by mouth 3 (three) times daily. Patient not taking: Reported on 09/08/2016 07/16/16   Waynetta Pean, PA-C  dicyclomine (BENTYL) 20 MG tablet Take 1 tablet (20 mg total) by mouth 3 (three) times daily as needed for spasms. 03/19/17   Earleen Newport, MD  divalproex (DEPAKOTE ER) 500 MG 24 hr tablet Take 1 tablet (500 mg total) by mouth 2 (two) times daily. 04/18/17   Virgel Manifold, MD  doxycycline (VIBRAMYCIN) 100 MG capsule Take 1 capsule (100 mg total) by mouth 2 (two) times daily. Patient not taking: Reported on 10/02/2016 09/08/16   Ocie Cornfield T, PA-C  fluticasone [facility]) 50  MCG/ACT nasal spray Place 2 sprays into both nostrils daily. Patient not taking: Reported on 02/26/2017 01/10/17   Horton, Barbette Hair, MD  insulin aspart protamine- aspart (NOVOLOG MIX 70/30) (70-30) 100 UNIT/ML injection Inject 0.1 mLs (10 Units total) into the skin 2 (two) times daily with a meal. Reported on 04/01/2015 03/04/17   Nance Pear, MD  Lidocaine 2 % GEL Apply 1 application topically 2 (two) times daily. As needed for pain 07/14/17   Rexanna Louthan, PA-C  lisinopril (PRINIVIL,ZESTRIL) 20 MG tablet Take 1 tablet (20 mg total) by mouth daily. 03/04/17 03/04/18  Nance Pear, MD  lurasidone (LATUDA) 20 MG TABS tablet Take 1 tablet (20 mg total) by  mouth daily. 03/04/17   Nance Pear, MD  meclizine (ANTIVERT) 25 MG tablet Take 1 tablet (25 mg total) by mouth 3 (three) times daily as needed for dizziness. 06/08/17   Lily Kocher, PA-C  metFORMIN (GLUCOPHAGE) 500 MG tablet Take 2 tablets (1,000 mg total) by mouth 2 (two) times daily with a meal. 03/04/17   Nance Pear, MD  neomycin-polymyxin-hydrocortisone (CORTISPORIN) 3.5-10000-1 OTIC suspension Place 3 drops into the right ear 3 (three) times daily. Patient not taking: Reported on 02/26/2017 01/10/17   Horton, Barbette Hair, MD  nitroGLYCERIN (NITROSTAT) 0.4 MG SL tablet Place 1 tablet (0.4 mg total) under the tongue every 5 (five) minutes as needed for chest pain. Patient not taking: Reported on 02/26/2017 05/11/16 05/11/17  Rudene Re, MD  ondansetron (ZOFRAN ODT) 4 MG disintegrating tablet Take 1 tablet (4 mg total) by mouth every 8 (eight) hours as needed for nausea or vomiting. 03/19/17   Earleen Newport, MD  ondansetron (ZOFRAN) 4 MG tablet Take 1 tablet (4 mg total) by mouth every 8 (eight) hours as needed for nausea or vomiting. Patient not taking: Reported on 09/08/2016 05/11/16   Rudene Re, MD  pramoxine (PROCTOFOAM) 1 % foam Place 1 application rectally 3 (three) times daily as needed for hemorrhoids. 02/26/17   Carrie Mew, MD  predniSONE (DELTASONE) 50 MG tablet Take one 50 mg tablet once a day for 5 days. 03/16/17   Lannie Fields, PA-C  senna-docusate (SENOKOT-S) 8.6-50 MG tablet Take 2 tablets by mouth 2 (two) times daily. 02/26/17   Carrie Mew, MD    Family History Family History  Problem Relation Age of Onset  . Cancer Father   . Heart failure Father   . Migraines Father   . Seizures Father   . Stroke Father     Social History Social History   Tobacco Use  . Smoking status: Current Every Day Smoker    Packs/day: 0.50    Years: 30.00    Pack years: 15.00    Types: Cigarettes  . Smokeless tobacco: Never Used  Substance Use  Topics  . Alcohol use: No  . Drug use: No    Comment: Prior Hx crack cocaine abuse.     Allergies   Aspirin; Bee venom; Other; Naproxen; Toradol [ketorolac tromethamine]; Penicillins; Tramadol; and Vancomycin   Review of Systems Review of Systems  Skin: Positive for wound.  Allergic/Immunologic: Negative for immunocompromised state.  Hematological: Does not bruise/bleed easily.     Physical Exam Updated Vital Signs BP (!) 131/94 (BP Location: Left Arm)   Pulse 67   Temp 98.8 F (37.1 C) (Oral)   Resp 18   SpO2 100%   Physical Exam  Constitutional: She is oriented to person, place, and time. She appears well-developed and well-nourished. No distress.  Patient appears  in no distress.  HENT:  Head: Normocephalic and atraumatic.  Right Ear: Tympanic membrane, external ear and ear canal normal.  Left Ear: Tympanic membrane, external ear and ear canal normal.  Nose: Nose normal.  Mouth/Throat: Uvula is midline, oropharynx is clear and moist and mucous membranes are normal.  Small, <0.5 cm lac without active bleeding for draining. No other injury noted. No hematoma or obvious contusion.  No obvious foreign body.  Eyes: Pupils are equal, round, and reactive to light. EOM are normal.  PERRLA and EOMI.  No nystagmus.  Neck: Normal range of motion.  No tenderness to palpation of C-spine.  No step-offs.  Cardiovascular: Normal rate, regular rhythm and intact distal pulses.  Pulmonary/Chest: Effort normal and breath sounds normal. No respiratory distress. She has no wheezes.  Abdominal: She exhibits no distension.  Musculoskeletal: Normal range of motion.  Patient ambulatory.    Neurological: She is alert and oriented to person, place, and time. No sensory deficit.  Skin: Skin is warm. No rash noted.  Small laceration without surrounding erythema or edema.  No purulent drainage.  Psychiatric: She has a normal mood and affect.  Nursing note and vitals reviewed.    ED  Treatments / Results  Labs (all labs ordered are listed, but only abnormal results are displayed) Labs Reviewed - No data to display  EKG None  Radiology No results found.  Procedures Procedures (including critical care time)  Medications Ordered in ED Medications  Tdap (BOOSTRIX) injection 0.5 mL (has no administration in time range)  lidocaine-EPINEPHrine-tetracaine (LET) solution (has no administration in time range)     Initial Impression / Assessment and Plan / ED Course  I have reviewed the triage vital signs and the nursing notes.  Pertinent labs & imaging results that were available during my care of the patient were reviewed by me and considered in my medical decision making (see chart for details).     Patient presenting for evaluation of laceration of the scalp.  Physical exam reassuring, patient in no distress.  Laceration is small and without bleeding or signs of infection.  No deformities of the scalp palpated.  Will update tetanus and apply let for pain control.  Instructed patient to keep the area clean, continue Tylenol and ibuprofen for pain.  Follow-up with PCP as needed.  Dizziness is likely related to patient's chronic vertigo, when discussed with patient, she agrees.  At this time, patient appears safe for discharge.  Return precautions given.  Patient states she understands agrees plan.  Final Clinical Impressions(s) / ED Diagnoses   Final diagnoses:  Laceration of scalp, initial encounter    ED Discharge Orders        Ordered    Lidocaine 2 % GEL  2 times daily     07/14/17 1421       Faith [place]anan, PA-C 07/14/17 [place], [place], DO 07/14/17 1811

## 2017-07-14 NOTE — Discharge Instructions (Signed)
Continue taking Tylenol and ibuprofen as needed for pain. Continue using ice for pain. You may use the lidocaine cream as needed for continued pain. Follow-up with your primary care doctor as needed for further pain. Return to the emergency room if you develop vomiting, fevers, pus draining from the area, or any new or concerning symptoms.

## 2018-03-14 ENCOUNTER — Encounter (HOSPITAL_COMMUNITY): Payer: Self-pay | Admitting: Emergency Medicine

## 2018-03-14 ENCOUNTER — Other Ambulatory Visit: Payer: Self-pay

## 2018-03-14 ENCOUNTER — Emergency Department (HOSPITAL_COMMUNITY): Payer: Medicaid - Out of State

## 2018-03-14 ENCOUNTER — Emergency Department (HOSPITAL_COMMUNITY)
Admission: EM | Admit: 2018-03-14 | Discharge: 2018-03-14 | Discharge status: Against Medical Advice | Payer: Medicaid - Out of State | Attending: Emergency Medicine | Admitting: Emergency Medicine

## 2018-03-14 DIAGNOSIS — R0789 Other chest pain: Secondary | ICD-10-CM | POA: Diagnosis present

## 2018-03-14 DIAGNOSIS — Z5321 Procedure and treatment not carried out due to patient leaving prior to being seen by health care provider: Secondary | ICD-10-CM | POA: Insufficient documentation

## 2018-03-14 LAB — BASIC METABOLIC PANEL
Anion gap: 11 (ref 5–15)
BUN: 8 mg/dL (ref 6–20)
CHLORIDE: 99 mmol/L (ref 98–111)
CO2: 24 mmol/L (ref 22–32)
CREATININE: 0.76 mg/dL (ref 0.44–1.00)
Calcium: 9.4 mg/dL (ref 8.9–10.3)
GFR calc non Af Amer: >60 mL/min (ref >60)
Glucose, Bld: 334 mg/dL — ABNORMAL HIGH (ref 70–99)
Potassium: 3.7 mmol/L (ref 3.5–5.1)
SODIUM: 134 mmol/L — AB (ref 135–145)

## 2018-03-14 LAB — CBC
HEMATOCRIT: 44.5 % (ref 36.0–46.0)
Hemoglobin: 14.8 g/dL (ref 12.0–15.0)
MCH: 30.4 pg (ref 26.0–34.0)
MCHC: 33.3 g/dL (ref 30.0–36.0)
MCV: 91.4 fL (ref 80.0–100.0)
NRBC: 0 % (ref 0.0–0.2)
PLATELETS: 215 10*3/uL (ref 150–400)
RBC: 4.87 MIL/uL (ref 3.87–5.11)
RDW: 12.3 % (ref 11.5–15.5)
WBC: 8 10*3/uL (ref 4.0–10.5)

## 2018-03-14 LAB — I-STAT TROPONIN, ED: Troponin i, poc: 0 ng/mL (ref 0.00–0.08)

## 2018-03-14 LAB — I-STAT BETA HCG BLOOD, ED (MC, WL, AP ONLY)

## 2018-03-14 NOTE — ED Triage Notes (Signed)
Pt with chest pain with radiation to the left arm, she reports taking 2 nitro last night. Reports the pain got a little better but returned. She states "I am very stressed out due to my living situation." a/o x4 at triage.

## 2018-03-14 NOTE — ED Notes (Signed)
Patient to [facility] desk to let staff know she has to leave due to personal reasons at home.
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
## 2020-02-12 ENCOUNTER — Encounter
Admission: EM | Discharge status: Against Medical Advice | Payer: Self-pay | Source: Home / Self Care | Attending: Student in an Organized Health Care Education/Training Program

## 2020-02-12 ENCOUNTER — Emergency Department: Payer: PRIVATE HEALTH INSURANCE

## 2020-02-12 ENCOUNTER — Emergency Department: Payer: PRIVATE HEALTH INSURANCE | Admitting: Anesthesiology

## 2020-02-12 ENCOUNTER — Inpatient Hospital Stay
Admission: EM | Admit: 2020-02-12 | Discharge: 2020-02-17 | DRG: 224 | Discharge status: Against Medical Advice | Payer: PRIVATE HEALTH INSURANCE | Attending: Student in an Organized Health Care Education/Training Program | Admitting: Student in an Organized Health Care Education/Training Program

## 2020-02-12 DIAGNOSIS — Z8572 Personal history of non-Hodgkin lymphomas: Secondary | ICD-10-CM

## 2020-02-12 DIAGNOSIS — G40909 Epilepsy, unspecified, not intractable, without status epilepticus: Secondary | ICD-10-CM | POA: Diagnosis present

## 2020-02-12 DIAGNOSIS — Z7982 Long term (current) use of aspirin: Secondary | ICD-10-CM

## 2020-02-12 DIAGNOSIS — E1165 Type 2 diabetes mellitus with hyperglycemia: Secondary | ICD-10-CM | POA: Diagnosis present

## 2020-02-12 DIAGNOSIS — F4310 Post-traumatic stress disorder, unspecified: Secondary | ICD-10-CM | POA: Diagnosis present

## 2020-02-12 DIAGNOSIS — I252 Old myocardial infarction: Secondary | ICD-10-CM

## 2020-02-12 DIAGNOSIS — Z8589 Personal history of malignant neoplasm of other organs and systems: Secondary | ICD-10-CM

## 2020-02-12 DIAGNOSIS — E876 Hypokalemia: Secondary | ICD-10-CM | POA: Diagnosis not present

## 2020-02-12 DIAGNOSIS — Z90722 Acquired absence of ovaries, bilateral: Secondary | ICD-10-CM

## 2020-02-12 DIAGNOSIS — F259 Schizoaffective disorder, unspecified: Secondary | ICD-10-CM | POA: Diagnosis present

## 2020-02-12 DIAGNOSIS — Z880 Allergy status to penicillin: Secondary | ICD-10-CM

## 2020-02-12 DIAGNOSIS — R55 Syncope and collapse: Secondary | ICD-10-CM | POA: Diagnosis not present

## 2020-02-12 DIAGNOSIS — Z9049 Acquired absence of other specified parts of digestive tract: Secondary | ICD-10-CM

## 2020-02-12 DIAGNOSIS — E872 Acidosis: Secondary | ICD-10-CM | POA: Diagnosis not present

## 2020-02-12 DIAGNOSIS — F17210 Nicotine dependence, cigarettes, uncomplicated: Secondary | ICD-10-CM | POA: Diagnosis present

## 2020-02-12 DIAGNOSIS — I509 Heart failure, unspecified: Secondary | ICD-10-CM | POA: Diagnosis present

## 2020-02-12 DIAGNOSIS — Z79899 Other long term (current) drug therapy: Secondary | ICD-10-CM

## 2020-02-12 DIAGNOSIS — K565 Intestinal adhesions [bands], unspecified as to partial versus complete obstruction: Principal | ICD-10-CM | POA: Diagnosis present

## 2020-02-12 DIAGNOSIS — G4733 Obstructive sleep apnea (adult) (pediatric): Secondary | ICD-10-CM | POA: Diagnosis present

## 2020-02-12 DIAGNOSIS — Z885 Allergy status to narcotic agent status: Secondary | ICD-10-CM

## 2020-02-12 DIAGNOSIS — Z90710 Acquired absence of both cervix and uterus: Secondary | ICD-10-CM

## 2020-02-12 DIAGNOSIS — Z20822 Contact with and (suspected) exposure to covid-19: Secondary | ICD-10-CM | POA: Diagnosis present

## 2020-02-12 DIAGNOSIS — E785 Hyperlipidemia, unspecified: Secondary | ICD-10-CM | POA: Diagnosis present

## 2020-02-12 DIAGNOSIS — F819 Developmental disorder of scholastic skills, unspecified: Secondary | ICD-10-CM | POA: Diagnosis present

## 2020-02-12 DIAGNOSIS — R339 Retention of urine, unspecified: Secondary | ICD-10-CM | POA: Diagnosis not present

## 2020-02-12 DIAGNOSIS — M549 Dorsalgia, unspecified: Secondary | ICD-10-CM | POA: Diagnosis present

## 2020-02-12 DIAGNOSIS — I2510 Atherosclerotic heart disease of native coronary artery without angina pectoris: Secondary | ICD-10-CM | POA: Diagnosis present

## 2020-02-12 DIAGNOSIS — Z794 Long term (current) use of insulin: Secondary | ICD-10-CM

## 2020-02-12 DIAGNOSIS — I110 Hypertensive heart disease with heart failure: Secondary | ICD-10-CM | POA: Diagnosis present

## 2020-02-12 DIAGNOSIS — J449 Chronic obstructive pulmonary disease, unspecified: Secondary | ICD-10-CM | POA: Diagnosis present

## 2020-02-12 DIAGNOSIS — J9601 Acute respiratory failure with hypoxia: Secondary | ICD-10-CM | POA: Diagnosis not present

## 2020-02-12 DIAGNOSIS — Z5329 Procedure and treatment not carried out because of patient's decision for other reasons: Secondary | ICD-10-CM | POA: Diagnosis not present

## 2020-02-12 DIAGNOSIS — G43901 Migraine, unspecified, not intractable, with status migrainosus: Secondary | ICD-10-CM | POA: Diagnosis present

## 2020-02-12 DIAGNOSIS — Z7984 Long term (current) use of oral hypoglycemic drugs: Secondary | ICD-10-CM

## 2020-02-12 DIAGNOSIS — K56609 Unspecified intestinal obstruction, unspecified as to partial versus complete obstruction: Secondary | ICD-10-CM

## 2020-02-12 HISTORY — PX: EXPLORATORY LAPAROTOMY: SHX4079

## 2020-02-12 LAB — COMPREHENSIVE METABOLIC PANEL
ALT: 11 U/L (ref 0–55)
ALT: 33 U/L (ref 0–55)
AST (SGOT): 13 U/L (ref 10–42)
AST (SGOT): 167 U/L — ABNORMAL HIGH (ref 10–42)
Albumin/Globulin Ratio: 1.25 Ratio (ref 0.80–2.00)
Albumin/Globulin Ratio: 1.07 Ratio (ref 0.80–2.00)
Albumin: 4 gm/dL (ref 3.5–5.0)
Albumin: 3.2 gm/dL — ABNORMAL LOW (ref 3.5–5.0)
Alkaline Phosphatase: 140 U/L (ref 40–145)
Alkaline Phosphatase: 130 U/L (ref 40–145)
Anion Gap: 23.4 mMol/L — ABNORMAL HIGH (ref 7.0–18.0)
Anion Gap: 15.2 mMol/L (ref 7.0–18.0)
BUN / Creatinine Ratio: 15.2 Ratio (ref 10.0–30.0)
BUN / Creatinine Ratio: 20.5 Ratio (ref 10.0–30.0)
BUN: 16 mg/dL (ref 7–22)
BUN: 16 mg/dL (ref 7–22)
Bilirubin, Total: 0.8 mg/dL (ref 0.1–1.2)
Bilirubin, Total: 1.6 mg/dL — ABNORMAL HIGH (ref 0.1–1.2)
CO2: 18.3 mMol/L — ABNORMAL LOW (ref 20.0–30.0)
CO2: 24.5 mMol/L (ref 20.0–30.0)
Calcium: 10 mg/dL (ref 8.5–10.5)
Calcium: 9.3 mg/dL (ref 8.5–10.5)
Chloride: 93 mMol/L — ABNORMAL LOW (ref 98–110)
Chloride: 101 mMol/L (ref 98–110)
Creatinine: 1.05 mg/dL (ref 0.60–1.20)
Creatinine: 0.78 mg/dL (ref 0.60–1.20)
EGFR: 60 mL/min/{1.73_m2} (ref 60–150)
EGFR: 86 mL/min/{1.73_m2} (ref 60–150)
Globulin: 3.2 gm/dL (ref 2.0–4.0)
Globulin: 3 gm/dL (ref 2.0–4.0)
Glucose: 518 mg/dL — ABNORMAL HIGH (ref 71–99)
Glucose: 310 mg/dL — ABNORMAL HIGH (ref 71–99)
Osmolality Calculated: 285 mOsm/kg (ref 275–300)
Osmolality Calculated: 285 mOsm/kg (ref 275–300)
Potassium: 4.7 mMol/L (ref 3.5–5.3)
Potassium: 4.7 mMol/L (ref 3.5–5.3)
Protein, Total: 7.2 gm/dL (ref 6.0–8.3)
Protein, Total: 6.2 gm/dL (ref 6.0–8.3)
Sodium: 130 mMol/L — ABNORMAL LOW (ref 136–147)
Sodium: 136 mMol/L (ref 136–147)

## 2020-02-12 LAB — B-TYPE NATRIURETIC PEPTIDE: B-Natriuretic Peptide: 12.1 pg/mL (ref 0.0–100.0)

## 2020-02-12 LAB — LIPASE: Lipase: 13 U/L (ref 8–78)

## 2020-02-12 LAB — VH XPERT XPRESS(C) SARS-COV-2 QUALITATIVE PCR
Is the patient pregnant?: NEGATIVE
SARS CoV-2 by PCR: NEGATIVE

## 2020-02-12 LAB — VH URINALYSIS WITH MICROSCOPIC AND CULTURE IF INDICATED
Bilirubin, UA: NEGATIVE mg/dL
Blood, UA: NEGATIVE mg/dL
Glucose, UA: >=1000 mg/dL — AB
Ketones UA: 40 mg/dL — AB
Leukocyte Esterase, UA: NEGATIVE Leu/uL
Nitrite, UA: NEGATIVE
Protein, UR: NEGATIVE mg/dL
Urine Specific Gravity: 1.02 (ref 1.001–1.040)
Urobilinogen, UA: 0.2 mg/dL
pH, Urine: 5.5 pH (ref 5.0–8.0)

## 2020-02-12 LAB — CBC AND DIFFERENTIAL
Basophils %: 0.3 % (ref 0.0–3.0)
Basophils Absolute: 0 10*3/uL (ref 0.0–0.3)
Eosinophils %: 0 % (ref 0.0–7.0)
Eosinophils Absolute: 0 10*3/uL (ref 0.0–0.8)
Hematocrit: 52.9 % — ABNORMAL HIGH (ref 36.0–48.0)
Hemoglobin: 17.3 gm/dL — ABNORMAL HIGH (ref 12.0–16.0)
Lymphocytes Absolute: 1.4 10*3/uL (ref 0.6–5.1)
Lymphocytes: 11.8 % — ABNORMAL LOW (ref 15.0–46.0)
MCH: 31 pg (ref 28–35)
MCHC: 33 gm/dL (ref 31–36)
MCV: 94 fL (ref 80–100)
MPV: 8.4 fL (ref 6.0–10.0)
Monocytes Absolute: 0.7 10*3/uL (ref 0.1–1.7)
Monocytes: 5.6 % (ref 3.0–15.0)
Neutrophils %: 82.3 % — ABNORMAL HIGH (ref 42.0–78.0)
Neutrophils Absolute: 10 10*3/uL — ABNORMAL HIGH (ref 1.7–8.6)
PLT CT: 235 10*3/uL (ref 130–440)
RBC: 5.65 10*6/uL — ABNORMAL HIGH (ref 3.80–5.00)
RDW: 11.7 % (ref 10.5–14.5)
WBC: 12.2 10*3/uL — ABNORMAL HIGH (ref 4.0–11.0)

## 2020-02-12 LAB — VH DEXTROSE STICK GLUCOSE
Glucose POCT: 302 mg/dL — ABNORMAL HIGH (ref 71–99)
Glucose POCT: 315 mg/dL — ABNORMAL HIGH (ref 71–99)
Glucose POCT: 276 mg/dL — ABNORMAL HIGH (ref 71–99)
Glucose POCT: 277 mg/dL — ABNORMAL HIGH (ref 71–99)
Glucose POCT: 269 mg/dL — ABNORMAL HIGH (ref 71–99)

## 2020-02-12 LAB — LACTIC ACID, PLASMA: Lactic Acid: 2.3 mMol/L (ref 0.5–1.9)

## 2020-02-12 LAB — KETONES, BLOOD

## 2020-02-12 SURGERY — LAPAROTOMY, EXPLORATORY
Anesthesia: Anesthesia General | Site: Abdomen | Wound class: Contaminated

## 2020-02-12 MED ORDER — LANTUS SOLOSTAR 100 UNIT/ML SC SOPN
40.0000 [IU] | PEN_INJECTOR | Freq: Every evening | SUBCUTANEOUS | Status: DC
Start: 2020-02-12 — End: 2020-02-17
  Administered 2020-02-12 – 2020-02-16 (×4): 40 [IU] via SUBCUTANEOUS
  Filled 2020-02-12: qty 3

## 2020-02-12 MED ORDER — LACTATED RINGERS IV SOLN
INTRAVENOUS | Status: DC | PRN
Start: 2020-02-12 — End: 2020-02-12

## 2020-02-12 MED ORDER — SODIUM CHLORIDE 0.9 % IR SOLN
Status: DC | PRN
Start: 2020-02-12 — End: 2020-02-12
  Administered 2020-02-12: 16:00:00 500 mL

## 2020-02-12 MED ORDER — SODIUM CHLORIDE (PF) 0.9 % IJ SOLN
40.0000 mg | INTRAVENOUS | Status: DC
Start: 2020-02-12 — End: 2020-02-17
  Administered 2020-02-12 – 2020-02-16 (×5): 40 mg via INTRAVENOUS
  Filled 2020-02-12 (×7): qty 40

## 2020-02-12 MED ORDER — LAMOTRIGINE 25 MG PO TABS
25.0000 mg | ORAL_TABLET | Freq: Every day | ORAL | Status: DC
Start: 2020-02-13 — End: 2020-02-12
  Filled 2020-02-12: qty 1

## 2020-02-12 MED ORDER — RISPERIDONE 1 MG PO TABS
1.0000 mg | ORAL_TABLET | Freq: Every day | ORAL | Status: DC
Start: 2020-02-13 — End: 2020-02-17
  Administered 2020-02-13 – 2020-02-16 (×4): 1 mg via ORAL
  Filled 2020-02-12 (×6): qty 1

## 2020-02-12 MED ORDER — INSULIN LISPRO (1 UNIT DIAL) 100 UNIT/ML SC SOPN
7.0000 [IU] | PEN_INJECTOR | Freq: Once | SUBCUTANEOUS | Status: AC
Start: 2020-02-12 — End: 2020-02-12
  Administered 2020-02-12: 12:00:00 7 [IU] via SUBCUTANEOUS

## 2020-02-12 MED ORDER — ONDANSETRON HCL 4 MG/2ML IJ SOLN
4.0000 mg | Freq: Once | INTRAMUSCULAR | Status: AC
Start: 2020-02-12 — End: 2020-02-12
  Administered 2020-02-12: 07:00:00 4 mg via INTRAVENOUS

## 2020-02-12 MED ORDER — MIDAZOLAM HCL 1 MG/ML IJ SOLN (WRAP)
INTRAMUSCULAR | Status: AC
Start: 2020-02-12 — End: ?
  Filled 2020-02-12: qty 2

## 2020-02-12 MED ORDER — SODIUM CHLORIDE 0.9 % IV SOLN
INTRAVENOUS | Status: DC | PRN
Start: 2020-02-12 — End: 2020-02-14

## 2020-02-12 MED ORDER — DEXAMETHASONE SODIUM PHOSPHATE 4 MG/ML IJ SOLN
INTRAMUSCULAR | Status: AC
Start: 2020-02-12 — End: ?
  Filled 2020-02-12: qty 1

## 2020-02-12 MED ORDER — ENOXAPARIN SODIUM 40 MG/0.4ML SC SOLN
SUBCUTANEOUS | Status: AC
Start: 2020-02-12 — End: ?
  Filled 2020-02-12: qty 0.4

## 2020-02-12 MED ORDER — DEXTROSE 10 % IV BOLUS
125.0000 mL | INTRAVENOUS | Status: DC | PRN
Start: 2020-02-12 — End: 2020-02-17

## 2020-02-12 MED ORDER — SUCCINYLCHOLINE CHLORIDE 20 MG/ML IJ SOLN
INTRAMUSCULAR | Status: DC | PRN
Start: 2020-02-12 — End: 2020-02-12
  Administered 2020-02-12: 100 mg via INTRAVENOUS

## 2020-02-12 MED ORDER — ONDANSETRON HCL 4 MG/2ML IJ SOLN
INTRAMUSCULAR | Status: AC
Start: 2020-02-12 — End: ?
  Filled 2020-02-12: qty 2

## 2020-02-12 MED ORDER — ROCURONIUM BROMIDE 50 MG/5ML IV SOLN
INTRAVENOUS | Status: AC
Start: 2020-02-12 — End: ?
  Filled 2020-02-12: qty 5

## 2020-02-12 MED ORDER — KETOROLAC TROMETHAMINE 30 MG/ML IJ SOLN
15.0000 mg | Freq: Four times a day (QID) | INTRAMUSCULAR | Status: DC
Start: 2020-02-12 — End: 2020-02-12

## 2020-02-12 MED ORDER — CITALOPRAM HYDROBROMIDE 20 MG PO TABS
40.0000 mg | ORAL_TABLET | Freq: Every day | ORAL | Status: DC
Start: 2020-02-13 — End: 2020-02-12
  Filled 2020-02-12: qty 2

## 2020-02-12 MED ORDER — ENOXAPARIN SODIUM 40 MG/0.4ML SC SOLN
40.0000 mg | SUBCUTANEOUS | Status: DC
Start: 2020-02-12 — End: 2020-02-17
  Administered 2020-02-12 – 2020-02-16 (×5): 40 mg via SUBCUTANEOUS
  Filled 2020-02-12 (×5): qty 0.4

## 2020-02-12 MED ORDER — VH KETAMINE HCL 200 MG/20 ML IV (NARRATOR)
INTRAMUSCULAR | Status: DC | PRN
Start: 2020-02-12 — End: 2020-02-12
  Administered 2020-02-12: 40 mg via INTRAVENOUS

## 2020-02-12 MED ORDER — ONDANSETRON HCL 4 MG/2ML IJ SOLN
INTRAMUSCULAR | Status: DC | PRN
Start: 2020-02-12 — End: 2020-02-12
  Administered 2020-02-12: 4 mg via INTRAVENOUS

## 2020-02-12 MED ORDER — TRAZODONE HCL 50 MG PO TABS
200.0000 mg | ORAL_TABLET | Freq: Every evening | ORAL | Status: DC
Start: 2020-02-12 — End: 2020-02-17
  Administered 2020-02-12 – 2020-02-16 (×4): 200 mg via ORAL
  Filled 2020-02-12 (×7): qty 4

## 2020-02-12 MED ORDER — CIPROFLOXACIN IN D5W 400 MG/200ML IV SOLN
INTRAVENOUS | Status: AC
Start: 2020-02-12 — End: ?
  Filled 2020-02-12: qty 200

## 2020-02-12 MED ORDER — METFORMIN HCL 500 MG PO TABS
1000.0000 mg | ORAL_TABLET | Freq: Every day | ORAL | Status: DC
Start: 2020-02-13 — End: 2020-02-12
  Filled 2020-02-12: qty 2

## 2020-02-12 MED ORDER — SUCCINYLCHOLINE CHLORIDE 20 MG/ML IJ SOLN
INTRAMUSCULAR | Status: AC
Start: 2020-02-12 — End: ?
  Filled 2020-02-12: qty 10

## 2020-02-12 MED ORDER — ATORVASTATIN CALCIUM 10 MG PO TABS
20.0000 mg | ORAL_TABLET | Freq: Every day | ORAL | Status: DC
Start: 2020-02-13 — End: 2020-02-12
  Filled 2020-02-12: qty 2

## 2020-02-12 MED ORDER — FENTANYL CITRATE (PF) 50 MCG/ML IJ SOLN (WRAP)
INTRAMUSCULAR | Status: DC | PRN
Start: 2020-02-12 — End: 2020-02-12
  Administered 2020-02-12 (×2): 50 ug via INTRAVENOUS

## 2020-02-12 MED ORDER — EPHEDRINE SULFATE 50 MG/ML IJ/IV SOLN (WRAP)
Status: AC
Start: 2020-02-12 — End: ?
  Filled 2020-02-12: qty 1

## 2020-02-12 MED ORDER — SUGAMMADEX SODIUM 200 MG/2ML IV SOLN
INTRAVENOUS | Status: DC | PRN
Start: 2020-02-12 — End: 2020-02-12
  Administered 2020-02-12: 200 mg via INTRAVENOUS

## 2020-02-12 MED ORDER — SODIUM CHLORIDE (PF) 0.9 % IJ SOLN
0.4000 mg | INTRAMUSCULAR | Status: DC | PRN
Start: 2020-02-12 — End: 2020-02-12

## 2020-02-12 MED ORDER — SODIUM CHLORIDE (PF) 0.9 % IJ SOLN
20.0000 mg | Freq: Two times a day (BID) | INTRAVENOUS | Status: DC
Start: 2020-02-12 — End: 2020-02-12

## 2020-02-12 MED ORDER — VH HYDROMORPHONE HCL PF 1 MG/ML CARPUJECT
INTRAMUSCULAR | Status: AC
Start: 2020-02-12 — End: ?
  Filled 2020-02-12: qty 1

## 2020-02-12 MED ORDER — VH HYDROMORPHONE 0.2 MG/ML 50 ML CASSETTE
Status: DC
Start: 2020-02-12 — End: 2020-02-13
  Administered 2020-02-12: 18:00:00 10 mg via INTRAVENOUS
  Filled 2020-02-12: qty 50

## 2020-02-12 MED ORDER — ALBUTEROL SULFATE HFA 108 (90 BASE) MCG/ACT IN AERS
INHALATION_SPRAY | RESPIRATORY_TRACT | Status: AC
Start: 2020-02-12 — End: ?
  Filled 2020-02-12: qty 8

## 2020-02-12 MED ORDER — LIDOCAINE HCL (PF) 2 % IJ SOLN
INTRAMUSCULAR | Status: AC
Start: 2020-02-12 — End: ?
  Filled 2020-02-12: qty 5

## 2020-02-12 MED ORDER — LIDOCAINE HCL (PF) 2 % IJ SOLN
INTRAMUSCULAR | Status: DC | PRN
Start: 2020-02-12 — End: 2020-02-12
  Administered 2020-02-12: 80 mg via INTRAVENOUS

## 2020-02-12 MED ORDER — SODIUM CHLORIDE 0.9 % IV BOLUS
2000.0000 mL | Freq: Once | INTRAVENOUS | Status: AC
Start: 2020-02-12 — End: 2020-02-12
  Administered 2020-02-12: 07:00:00 2000 mL via INTRAVENOUS

## 2020-02-12 MED ORDER — ACETAMINOPHEN 10 MG/ML IV SOLN
INTRAVENOUS | Status: DC | PRN
Start: 2020-02-12 — End: 2020-02-12
  Administered 2020-02-12: 1000 mg via INTRAVENOUS

## 2020-02-12 MED ORDER — ENOXAPARIN SODIUM 40 MG/0.4ML SC SOLN
40.0000 mg | SUBCUTANEOUS | Status: DC
Start: 2020-02-12 — End: 2020-02-12

## 2020-02-12 MED ORDER — SODIUM CHLORIDE (PF) 0.9 % IJ SOLN
0.4000 mg | INTRAMUSCULAR | Status: DC | PRN
Start: 2020-02-12 — End: 2020-02-17

## 2020-02-12 MED ORDER — SODIUM CHLORIDE (PF) 0.9 % IJ SOLN
3.0000 mL | Freq: Two times a day (BID) | INTRAMUSCULAR | Status: DC
Start: 2020-02-12 — End: 2020-02-17
  Administered 2020-02-12 – 2020-02-16 (×8): 3 mL via INTRAVENOUS

## 2020-02-12 MED ORDER — FENTANYL CITRATE (PF) 50 MCG/ML IJ SOLN (WRAP)
INTRAMUSCULAR | Status: AC
Start: 2020-02-12 — End: ?
  Filled 2020-02-12: qty 2

## 2020-02-12 MED ORDER — FENTANYL CITRATE (PF) 50 MCG/ML IJ SOLN (WRAP)
50.0000 ug | INTRAMUSCULAR | Status: DC | PRN
Start: 2020-02-12 — End: 2020-02-12

## 2020-02-12 MED ORDER — CIPROFLOXACIN IN D5W 400 MG/200ML IV SOLN
400.0000 mg | Freq: Once | INTRAVENOUS | Status: AC
Start: 2020-02-12 — End: 2020-02-12
  Administered 2020-02-12: 15:00:00 400 mg via INTRAVENOUS

## 2020-02-12 MED ORDER — DIPHENHYDRAMINE HCL 50 MG/ML IJ SOLN
INTRAMUSCULAR | Status: AC
Start: 2020-02-12 — End: ?
  Filled 2020-02-12: qty 1

## 2020-02-12 MED ORDER — TOPIRAMATE 25 MG PO TABS
50.0000 mg | ORAL_TABLET | Freq: Every day | ORAL | Status: DC
Start: 2020-02-13 — End: 2020-02-17
  Administered 2020-02-13 – 2020-02-16 (×4): 50 mg via ORAL
  Filled 2020-02-12 (×6): qty 2

## 2020-02-12 MED ORDER — INSULIN LISPRO (1 UNIT DIAL) 100 UNIT/ML SC SOPN
PEN_INJECTOR | SUBCUTANEOUS | Status: AC
Start: 2020-02-12 — End: ?
  Filled 2020-02-12: qty 3

## 2020-02-12 MED ORDER — MIDAZOLAM HCL 1 MG/ML IJ SOLN (WRAP)
INTRAMUSCULAR | Status: DC | PRN
Start: 2020-02-12 — End: 2020-02-12
  Administered 2020-02-12: 2 mg via INTRAVENOUS

## 2020-02-12 MED ORDER — KETAMINE HCL 10 MG/ML IJ SOLN
INTRAMUSCULAR | Status: AC
Start: 2020-02-12 — End: ?
  Filled 2020-02-12: qty 20

## 2020-02-12 MED ORDER — VH HYDROMORPHONE HCL PF 1 MG/ML CARPUJECT
0.5000 mg | INTRAMUSCULAR | Status: DC | PRN
Start: 2020-02-12 — End: 2020-02-12
  Administered 2020-02-12 (×2): 0.5 mg via INTRAVENOUS

## 2020-02-12 MED ORDER — SUGAMMADEX SODIUM 200 MG/2ML IV SOLN
INTRAVENOUS | Status: AC
Start: 2020-02-12 — End: ?
  Filled 2020-02-12: qty 2

## 2020-02-12 MED ORDER — GLUCAGON 1 MG IJ SOLR (WRAP)
1.0000 mg | INTRAMUSCULAR | Status: DC | PRN
Start: 2020-02-12 — End: 2020-02-17

## 2020-02-12 MED ORDER — INSULIN LISPRO (1 UNIT DIAL) 100 UNIT/ML SC SOPN
2.0000 [IU] | PEN_INJECTOR | SUBCUTANEOUS | Status: DC
Start: 2020-02-12 — End: 2020-02-13
  Administered 2020-02-12: 21:00:00 7 [IU] via SUBCUTANEOUS
  Filled 2020-02-12: qty 3

## 2020-02-12 MED ORDER — BUPIVACAINE-EPINEPHRINE (PF) 0.5% -1:200000 IJ SOLN
INTRAMUSCULAR | Status: AC
Start: 2020-02-12 — End: ?
  Filled 2020-02-12: qty 30

## 2020-02-12 MED ORDER — IOHEXOL 350 MG/ML IV SOLN
100.0000 mL | Freq: Once | INTRAVENOUS | Status: AC | PRN
Start: 2020-02-12 — End: 2020-02-12
  Administered 2020-02-12: 08:00:00 100 mL via INTRAVENOUS

## 2020-02-12 MED ORDER — SODIUM CHLORIDE 0.9 % IV SOLN
INTRAVENOUS | Status: DC
Start: 2020-02-12 — End: 2020-02-15

## 2020-02-12 MED ORDER — METRONIDAZOLE IN NACL 500 MG/100 ML IV SOLN (WRAP)
500.0000 mg | INTRAVENOUS | Status: AC
Start: 2020-02-12 — End: 2020-02-12
  Administered 2020-02-12: 15:00:00 500 mg via INTRAVENOUS

## 2020-02-12 MED ORDER — DIPHENHYDRAMINE HCL 50 MG/ML IJ SOLN
12.5000 mg | Freq: Four times a day (QID) | INTRAMUSCULAR | Status: DC | PRN
Start: 2020-02-12 — End: 2020-02-17
  Administered 2020-02-12: 19:00:00 12.5 mg via INTRAVENOUS
  Filled 2020-02-12: qty 1

## 2020-02-12 MED ORDER — INSULIN REGULAR(HUMAN) IN NACL 100-0.9 UT/100ML-% IV SOLN
0.5000 [IU]/h | INTRAVENOUS | Status: DC
Start: 2020-02-12 — End: 2020-02-12
  Administered 2020-02-12: 16:00:00 2 [IU]/h via INTRAVENOUS

## 2020-02-12 MED ORDER — METRONIDAZOLE IN NACL 500 MG/100 ML IV SOLN
INTRAVENOUS | Status: AC
Start: 2020-02-12 — End: ?
  Filled 2020-02-12: qty 100

## 2020-02-12 MED ORDER — PROPOFOL 200 MG/20ML IV EMUL
INTRAVENOUS | Status: DC | PRN
Start: 2020-02-12 — End: 2020-02-12
  Administered 2020-02-12: 50 mg via INTRAVENOUS
  Administered 2020-02-12: 150 mg via INTRAVENOUS

## 2020-02-12 MED ORDER — OXYCODONE HCL 5 MG PO TABS
5.0000 mg | ORAL_TABLET | Freq: Once | ORAL | Status: DC | PRN
Start: 2020-02-12 — End: 2020-02-12

## 2020-02-12 MED ORDER — VH HYDROMORPHONE HCL PF 1 MG/ML CARPUJECT
1.0000 mg | Freq: Once | INTRAMUSCULAR | Status: AC
Start: 2020-02-12 — End: 2020-02-12
  Administered 2020-02-12: 12:00:00 1 mg via INTRAVENOUS

## 2020-02-12 MED ORDER — BUSPIRONE HCL 10 MG PO TABS
10.0000 mg | ORAL_TABLET | Freq: Every day | ORAL | Status: DC
Start: 2020-02-13 — End: 2020-02-12
  Filled 2020-02-12: qty 1

## 2020-02-12 MED ORDER — DIPHENHYDRAMINE HCL 50 MG/ML IJ SOLN
25.0000 mg | Freq: Once | INTRAMUSCULAR | Status: AC
Start: 2020-02-12 — End: 2020-02-12
  Administered 2020-02-12: 13:00:00 25 mg via INTRAVENOUS

## 2020-02-12 MED ORDER — VH HYDROMORPHONE HCL PF 1 MG/ML CARPUJECT
0.5000 mg | Freq: Once | INTRAMUSCULAR | Status: AC
Start: 2020-02-12 — End: 2020-02-12
  Administered 2020-02-12: 10:00:00 0.5 mg via INTRAVENOUS

## 2020-02-12 MED ORDER — ONDANSETRON HCL 4 MG/2ML IJ SOLN
4.0000 mg | Freq: Once | INTRAMUSCULAR | Status: DC | PRN
Start: 2020-02-12 — End: 2020-02-12

## 2020-02-12 MED ORDER — PROPOFOL 200 MG/20ML IV EMUL
INTRAVENOUS | Status: AC
Start: 2020-02-12 — End: ?
  Filled 2020-02-12: qty 20

## 2020-02-12 MED ORDER — VH PHENYLEPHRINE 30 MG IN NS 250 ML INFUSION (SIMPLE)
INTRAVENOUS | Status: AC
Start: 2020-02-12 — End: ?
  Filled 2020-02-12: qty 250

## 2020-02-12 MED ORDER — ACETAMINOPHEN 10 MG/ML IV SOLN
INTRAVENOUS | Status: AC
Start: 2020-02-12 — End: ?
  Filled 2020-02-12: qty 100

## 2020-02-12 MED ORDER — ROCURONIUM BROMIDE 50 MG/5ML IV SOLN
INTRAVENOUS | Status: DC | PRN
Start: 2020-02-12 — End: 2020-02-12
  Administered 2020-02-12: 30 mg via INTRAVENOUS

## 2020-02-12 MED ORDER — HYDROXYZINE HCL 25 MG PO TABS
25.0000 mg | ORAL_TABLET | Freq: Every day | ORAL | Status: DC
Start: 2020-02-12 — End: 2020-02-14
  Administered 2020-02-12 – 2020-02-14 (×3): 25 mg via ORAL
  Filled 2020-02-12 (×5): qty 1

## 2020-02-12 MED ORDER — BUPIVACAINE-EPINEPHRINE (PF) 0.5% -1:200000 IJ SOLN
INTRAMUSCULAR | Status: DC | PRN
Start: 2020-02-12 — End: 2020-02-12
  Administered 2020-02-12: 6 mL via INTRAMUSCULAR

## 2020-02-12 MED ORDER — INSULIN REGULAR(HUMAN) IN NACL 100-0.9 UT/100ML-% IV SOLN
INTRAVENOUS | Status: AC
Start: 2020-02-12 — End: ?
  Filled 2020-02-12: qty 100

## 2020-02-12 MED ORDER — LISINOPRIL 20 MG PO TABS
20.0000 mg | ORAL_TABLET | Freq: Every day | ORAL | Status: DC
Start: 2020-02-13 — End: 2020-02-15
  Administered 2020-02-13 – 2020-02-15 (×3): 20 mg via ORAL
  Filled 2020-02-12 (×4): qty 1

## 2020-02-12 MED ORDER — ONDANSETRON 4 MG PO TBDP
ORAL_TABLET | ORAL | Status: AC
Start: 2020-02-12 — End: ?
  Filled 2020-02-12: qty 1

## 2020-02-12 MED ORDER — HALOPERIDOL LACTATE 5 MG/ML IJ SOLN
1.0000 mg | Freq: Once | INTRAMUSCULAR | Status: DC | PRN
Start: 2020-02-12 — End: 2020-02-12

## 2020-02-12 MED ORDER — DIVALPROEX SODIUM ER 250 MG PO TB24
500.0000 mg | ORAL_TABLET | Freq: Two times a day (BID) | ORAL | Status: DC
Start: 2020-02-12 — End: 2020-02-12
  Filled 2020-02-12 (×3): qty 2

## 2020-02-12 MED ORDER — ONDANSETRON HCL 4 MG/2ML IJ SOLN
4.0000 mg | INTRAMUSCULAR | Status: DC | PRN
Start: 2020-02-12 — End: 2020-02-17
  Administered 2020-02-14 – 2020-02-16 (×7): 4 mg via INTRAVENOUS
  Filled 2020-02-12 (×7): qty 2

## 2020-02-12 MED ORDER — VH HYDROMORPHONE HCL PF 1 MG/ML CARPUJECT
0.5000 mg | INTRAMUSCULAR | Status: DC | PRN
Start: 2020-02-12 — End: 2020-02-12

## 2020-02-12 MED ORDER — FENTANYL CITRATE (PF) 50 MCG/ML IJ SOLN (WRAP)
50.0000 ug | INTRAMUSCULAR | Status: DC | PRN
Start: 2020-02-12 — End: 2020-02-12
  Administered 2020-02-12 (×2): 50 ug via INTRAVENOUS

## 2020-02-12 MED ORDER — KETOROLAC TROMETHAMINE 30 MG/ML IJ SOLN
15.0000 mg | Freq: Four times a day (QID) | INTRAMUSCULAR | Status: DC | PRN
Start: 2020-02-12 — End: 2020-02-12

## 2020-02-12 MED ORDER — VH HYDROMORPHONE HCL PF 1 MG/ML CARPUJECT
1.0000 mg | Freq: Once | INTRAMUSCULAR | Status: AC
Start: 2020-02-12 — End: 2020-02-12
  Administered 2020-02-12: 08:00:00 1 mg via INTRAVENOUS

## 2020-02-12 MED ORDER — SODIUM CHLORIDE (PF) 0.9 % IJ SOLN
0.4000 mg | Freq: Two times a day (BID) | INTRAMUSCULAR | Status: DC | PRN
Start: 2020-02-12 — End: 2020-02-17

## 2020-02-12 SURGICAL SUPPLY — 17 items
BINDER ABDOMINAL 10 (Supply) ×2 IMPLANT
DRSG COMBINE 8 X 7.5 (Dressings) ×2 IMPLANT
DRSG TELFA ISLAND 3 X 8 (Dressings) ×2 IMPLANT
NDL HYPO 25GA X 1.5 (Supply) ×4 IMPLANT
PACK SMH GENERAL SURGERY (Supply) ×2 IMPLANT
STAPLER PROX PLUS WIDE SKIN (Supply) ×2 IMPLANT
STAPLER SKIN OPPOSE 35W 803712 (Supply) ×2 IMPLANT
SUT PDS II 1 Z880G (Supply) ×4 IMPLANT
SUT SILK 2-0 K833H (Supply) ×2 IMPLANT
SUT VICRYL 3-0 J784G (Supply) ×2 IMPLANT
SYRINGE 10CC LL (Supply) ×4 IMPLANT
TAPE MICROPORE 1 (PAPER) (Supply) ×2 IMPLANT
TOWEL BLUE STERILE 6 PER PK (Supply) ×2 IMPLANT
TRAY SKIN PREP  DRY (Supply) ×2 IMPLANT
TRAY URINEMETER FOLEY LATEXFRE (TDC (Tubes, Draines, Catheters)) ×1
TRAY URINEMETER FOLEY LATEXFRE (TDC (Tubes, Drains, Catheters)) ×1 IMPLANT
TUBE YANKAUER WITHOUT VENT (Supply) ×2 IMPLANT

## 2020-02-12 NOTE — Op Note (Signed)
OPERATIVE  NOTE   GENERAL  SURGERY      Date & Time:  02/12/20   4:28 PM    Patient Name:     Tracy Bullock    Date of Operation:     02/12/2020    Providers Performing:     Surgeon(s):  Laurell Roof Roselle Locus, MD    Assistant (s):     Circulator: Day, Barkley Bruns, RN  Relief Circulator: Donald Siva, RN  Scrub Person: Arehart, Carloyn Manner, RN    Operative Procedure:     --  Abdominal exploration     --  Lyses of adhesions       Preoperative Diagnosis:       Abdominal pain and abdominal exam concerning for high-grade small bowel obstruction.        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       Findings:     --  Small bowel obstruction, with extensive intra-abdominal adhesions.        Complications:     None.      Summary of Procedure:       I met with the patient in the holding area prior to the administration of anesthetics or sedatives.  We discussed the proposed surgical procedure -- abdominal exploration for correction of SBO.  All questions were answered.      Tracy Bullock was identified and taken to the OR.  She was placed supine on the OR table.  SCDs were in place and functioning.      Pre-operative antibiotics were administered.      General anesthesia was administered by the anesthesia department using an ETT.      A Foley catheter was placed in the patient's bladder.      An OG-tube was placed in her stomach.      Her abdomen was prepped with ChloraPrep, and draped in a sterile fashion.      A 'Time-Out' was performed, and all parties in attendance were in agreement.      Local anesthetic was infiltrated into the skin and abdominal wall at the proposed incision site.  A lower-midline incision was made using a 10-blade.  The dissection was carried down to the fascia, and the abdomen was carefully  entered.      Relatively dense adhesions were encountered in the lower abdomen.  The omentum and portions of small bowel were adherent to the anterior abdominal wall.  These adhesions were carefully taken down with scissors.  Kocher clamps were used to provide traction on the anterior abdominal wall fascia.  The midline incision was extended in both directions, cephalad and caudad, to provide greater exposure.      Additional adhesions were present between various loops of small bowel.  Using careful sharp dissection, these adhesions were lysed.  There were additional adhesions in the pelvis.  Carefully, these adhesions were also divided, freeing up the small bowel from the pelvis.      A transition point was identified in the mid-distal small bowel.  The small bowel was dilated proximal to this point, and decompressed distally.  By freeing up further  adhesions, this transition point was resolved.  The small bowel obstruction was corrected.      I was able to free-up the entire small bowel, leaving the small bowel patent, and relieving the compilation of kinks, twists, and compromised bowel loops.        At this point, all significant adhesions had been taken down, and I was able to run the small bowel from the ligament of Trietz to the ileocecal valve.  No further obstructing lesions or adhesions were noted.  The bowel was run once more for close inspection.      No inadvertent enterotomies were created during this operation.   Two small serosal tears were reinforced w/ interrupted 3-0 Vicryl.       The abdomen was explored further.  The stomach was normal, and an OG tube was present.  The liver was unremarkable.   Other than the adhesions, which had been lysed, no other pathology was apparent.  No evidence of malignancy was encountered.      The abdomen was irrigated w/ saline and suctioned dry.  Hemostasis was noted throughout the operative field.      The small bowel was placed back to its usual position within  the abdomen.  The patient did not have much omentum available to drape overtop the small bowel.  The midline fascia was closed using #1-looped-PDS.  The fascial closure looked good.      The skin was irrigated and closed with skin staples.   Dry, sterile dressings were placed.      At the end of the procedure all instrument, needle, and sponge counts were correct.      The patient was transferred to the recovery room in stable condition.             Signed by:    Dineen Kid, MD                                                                             [facility] MAIN OR        Although significant efforts were made to ensure accuracy of spelling and grammar, todays note was completed in large part by speech/voice recognition Chemical engineer) software and by keyboarding information into the electronic health care record in real time.  As such, there may be errors in grammar and spelling, insertion of incorrect words/phrases, pronoun errors, etc., that should be disregarded when reviewing this note.    JP

## 2020-02-12 NOTE — Transfer of Care (Signed)
Anesthesia Transfer of Care Note    Patient: Tracy Bullock    Last vitals:   Vitals:    02/12/20 1643   BP: 154/90   Pulse: 79   Resp: 17   Temp: 36.1 C (97 F)   SpO2: 99%       Oxygen: Mask     Mental Status:awake and alert     Airway: Natural    Cardiovascular Status:  stable

## 2020-02-12 NOTE — ED Triage Notes (Addendum)
Vomiting and lower abdominal pain x 2 days.  No medications for 2 days. hyperglycemia.

## 2020-02-12 NOTE — Interim Summary (Signed)
Pre-op Addendum:    14:55    Patient has a reported significant cardiac & medical history, including prior MI & CHF.      Pertinent past medical records are not available.  No prior cardiac cath.      Patient is at increased risk for peri-op cardiac events.      I had a discussion w/ anesthesia -- Dr. Alycia Rossetti.  We spoke w/ the patient about considering transfer to [facility] for a higher level of care and the availability of cardiology services.   The patient became upset and strongly refused to consider transfer to a medical center.      Her GI surgery is relatively urgent, therefore, we will proceed w/ exploration sooner.      JP

## 2020-02-12 NOTE — ED Provider Notes (Signed)
[facility]  EMERGENCY DEPARTMENT  History and Physical Exam       ________________________________________________________________________        Patient Name:  Tracy Bullock, Tracy Bullock   Age/Sex:  54 y.o.  /  female     Attending Physician:  Kathrynn Running, MD   MRN:  [id number]     PCP:  Zollie Beckers, NP   Room:  212/212-A     Patent DOB:  [id number]   Encounter Date:  02/12/2020       ________________________________________________________________________      History of Presenting Illness     Chief complaint: Abdominal Pain and Emesis    HPI/ROS is limited by: none  HPI/ROS given by: patient          Tracy Bullock is a 54 y.o. female who presents to the ED with complaint of Abdominal Pain and Emesis      HPI     Context: Patient presents with a complaint of nausea vomiting abdominal pain for 2 days.  Vomit initially was food now it is dry heaves.  There is been no blood or coffee grounds.  No diarrhea.  No similar episodes.  She denies any fever.  Denies any urinary frequency urgency.  She is insulin-dependent diabetic and has not been able to take her insulin.  She has not had a bowel movement for 2 days.  She states she is passing gas    Provocative: Attempting to eat and changes in position    Pallative Factors: None    Quality: Sharp and dull    Region: Right and left lower quadrant    Radiation: None    Severity: 10 out of 10    Temporal Factors: Began 2 days ago        Associated symptoms: Generalized weakness         Review of Systems        Review of Systems   Constitutional: Positive for fatigue. Negative for fever.   Respiratory: Positive for shortness of breath. Negative for cough.    Cardiovascular: Negative for chest pain.   Gastrointestinal: Positive for abdominal pain, nausea and vomiting.   Genitourinary: Negative.    Musculoskeletal: Negative.    Skin: Negative.    Neurological: Negative.    Psychiatric/Behavioral: Negative.    All other systems reviewed and are  negative.             Allergies & Medications     Pt is allergic to penicillins, toradol oral [ketorolac tromethamine], and tramadol.    Current Discharge Medication List      CONTINUE these medications which have NOT CHANGED    Details   aspirin 81 MG EC tablet Take 81 mg by mouth daily      hydrOXYzine (ATARAX) 25 MG tablet Take 25 mg by mouth daily      insulin aspart (NovoLOG FlexPen) 100 UNIT/ML injection pen Inject 100 Units into the skin daily      lisinopril (ZESTRIL) 20 MG tablet Take 20 mg by mouth daily      risperiDONE (RisperDAL) 1 MG tablet Take 1 mg by mouth daily      topiramate (TOPAMAX) 50 MG tablet Take 50 mg by mouth daily      traZODone (DESYREL) 100 MG tablet Take 200 mg by mouth nightly      Albuterol Sulfate 108 (90 Base) MCG/ACT Aerosol Pwdr, Breath Activated Inhale 2 puffs into the lungs every 4 (four) hours  Past Medical History     Pt  has a past medical history of Back pain, CHF (congestive heart failure), Convulsions, COPD (chronic obstructive pulmonary disease), Coronary artery disease, Diabetes mellitus, Hypertension, Learning disability, MI (myocardial infarction), and OSA (obstructive sleep apnea).           Past Surgical History     Pt  has a past surgical history that includes Appendectomy; Cholecystectomy; Hysterectomy; Cesarean section; Cataract extraction; and EXPLORATORY LAPAROTOMY (N/A, 02/12/2020).         Family History     The family history is not on file.         Social History     Social History     Tobacco Use    Smoking status: Current Every Day Smoker     Packs/day: 0.50     Types: Cigarettes    Smokeless tobacco: Never Used   Vaping Use    Vaping Use: Never used   Substance Use Topics    Alcohol use: Never    Drug use: Never                    Physical Exam     Blood pressure 104/65, pulse 84, temperature 97.5 F (36.4 C), temperature source Tympanic, resp. rate (!) 24, height 1.575 m, weight 85.1 kg, SpO2 92 %.       Physical Exam  Vitals and  nursing note reviewed.   Constitutional:       General: She is in acute distress.      Appearance: She is well-developed.   HENT:      Head: Normocephalic and atraumatic.      Mouth/Throat:      Comments: Mouth is dry  Eyes:      General: No scleral icterus.  Cardiovascular:      Rate and Rhythm: Normal rate and regular rhythm.   Pulmonary:      Effort: Pulmonary effort is normal.      Breath sounds: Normal breath sounds.   Abdominal:      General: Abdomen is flat. Bowel sounds are decreased.      Palpations: Abdomen is soft.      Tenderness: There is abdominal tenderness in the right lower quadrant, suprapubic area and left lower quadrant. There is guarding. There is no right CVA tenderness or left CVA tenderness.      Hernia: No hernia is present.   Skin:     General: Skin is warm and dry.      Capillary Refill: Capillary refill takes less than 2 seconds.      Coloration: Skin is not jaundiced.   Neurological:      General: No focal deficit present.      Mental Status: She is alert and oriented to person, place, and time.              Orders Placed       Orders Placed This Encounter   Procedures    Respiratory Specimen Xpert Xpress(R) SARS-CoV-2 Qualitative PCR    CT Abdomen Pelvis with IV Cont    Ketones, blood    CBC and differential    Comprehensive metabolic panel    Lipase    Lactic Acid    Urinalysis w Microscopic and Culture if Indicated    B-type Natriuretic Peptide    Dextrose Stick Glucose    Comprehensive metabolic panel    Dextrose Stick Glucose    CBC and differential    Basic Metabolic  Panel    Dextrose Stick Glucose    Dextrose Stick Glucose    Hemoglobin A1C    Dextrose Stick Glucose    Dextrose Stick Glucose    Dextrose Stick Glucose    Basic Metabolic Panel    Magnesium    Phosphorus    Dextrose Stick Glucose    Basic Metabolic Panel    Lactic Acid    CBC and differential    Magnesium    Lactic Acid    Dextrose Stick Glucose    Dextrose Stick Glucose    Dextrose  Stick Glucose    Dextrose Stick Glucose    Dextrose Stick Glucose    CBC and differential    Basic Metabolic Panel    Magnesium    Phosphorus    Dextrose Stick Glucose    Dextrose Stick Glucose    Diet regular    Cardiac Monitoring (Hard Wire)    Measure Bilateral Blood Pressure    Vital signs    Pain Assessment    Mobility Protocol    RD initiate protocol if applicable    Notify physician    Height    Skin assessment    Oral care    Encourage fluids    Nursing Higher education careers adviser primary attending physician on morning rounds    Education: Tobacco Cessation    Education: Activity    Education: Disease Process & Condition    Education: Pain Management    Education: Falls Risk    Telemetry Monitoring    Mechanical VTE: Pneumatic Compression; Knee high    Vital signs    Intake and Output    Incentive spirometry nursing    Continuous Pulse Oximetry    Mechanical VTE: Pneumatic Compression; Knee high    May have meds with sips of water    Activity as tolerated    Mobility Protocol    Follow Urinary Catheter Protocol    Oxygen-Adult Low Flow Nasal Cannula (6lpm or less) Target SpO2: 92%    Simple carbohydrate    Notify provider if patient is vomiting or if patient is not eating    NSG Communication: If bedtime glucose is less than120 mg/dL or if CORRECTION was administered at bedtime, repeat glucose at 0300 and use HS CORRECTION dose PRN.    If bedtime glucose is less than 120 mg/dL give "15 gram carb and protein" snack (Select item #2 on [facility] Carbohydrate Treatment Protocol)    Assess and document percentage of meal eaten    FOR GLUCOSE MANAGEMENT, FOLLOW POLICY AND ALL PROTOCOLS, found in "order report".    FOR REVIEW of INSULIN SAFETY, see "[facility] INSULIN SAFETY RECOMMENDATIONS", found in "order report".    Incentive spirometry    Remove Urinary Catheter and Assess Patient within 6 hours    Follow Bladder Scan Algorithm    Incentive spirometry nursing    Insert Urinary  Catheter    Follow Urinary Catheter Protocol    Orthostatic blood pressure    NSG Communication: Glucose POCT order (Q 4 hours)    Full Code    Inpatient consult to Hospitalist    Inpatient consult to diabetes educator    OT eval and treat    PT evaluate and treat    RCAT Protocol    ECG 12 lead    Saline lock IV    saline lock IV    Bed Request    Admit to Inpatient    Admit to Inpatient  ED Admission Request    Transfer patient    Seizure precautions         ED Medication Orders (From admission, onward)    Start Ordered     Status Ordering Provider    02/12/20 1347 02/12/20 1347    Continuous        Route: Intravenous  Ordered Dose: 0.5 Units/hr     Discontinued KOCH, KRISTINA    02/12/20 1251 02/12/20 1250  diphenhydrAMINE (BENADRYL) injection 25 mg  Once in ED        Route: Intravenous  Ordered Dose: 25 mg     Last MAR action: Given Kathrynn Running    02/12/20 1202 02/12/20 1201  HYDROmorphone (DILAUDID) injection 1 mg  Once in ED        Route: Intravenous  Ordered Dose: 1 mg     Last MAR action: Given Kathrynn Running    02/12/20 1156 02/12/20 1155  insulin lispro (1 Unit Dial) (HumaLOG) injection pen 7 Units  Once        Route: Subcutaneous  Ordered Dose: 7 Units     Last MAR action: Given Cloretta Ned P    02/12/20 0941 02/12/20 0940  HYDROmorphone (DILAUDID) injection 0.5 mg  Once in ED        Route: Intravenous  Ordered Dose: 0.5 mg     Last MAR action: Given Kathrynn Running    02/12/20 0802 02/12/20 0802  iohexol (OMNIPAQUE) 350 MG/ML injection 100 mL  IMG once as needed        Route: Intravenous  Ordered Dose: 100 mL     Last MAR action: Imaging Agent Given Kathrynn Running    02/12/20 7628 02/12/20 0711  ondansetron (ZOFRAN) injection 4 mg  Once in ED        Route: Intravenous  Ordered Dose: 4 mg     Last MAR action: Given Kathrynn Running    02/12/20 3151 02/12/20 0711  HYDROmorphone (DILAUDID) injection 1 mg  Once in ED        Route: Intravenous  Ordered Dose: 1 mg      Last MAR action: Given Kathrynn Running    02/12/20 7616 02/12/20 0711  sodium chloride 0.9 % bolus 2,000 mL  Once in ED        Route: Intravenous  Ordered Dose: 2,000 mL     Last MAR action: Stopped Kathrynn Running                 Diagnostic Results     Laboratory results reviewed by ED provider:    Results     Procedure Component Value Units Date/Time    Dextrose Stick Glucose [[id number]]  (Abnormal) Collected: 02/14/20 2112    Specimen: Blood Updated: 02/14/20 2129     Glucose POCT 146 mg/dL     Dextrose Stick Glucose [[id number]]  (Abnormal) Collected: 02/14/20 1820    Specimen: Blood Updated: 02/14/20 1837     Glucose POCT 125 mg/dL     Dextrose Stick Glucose [[id number]]  (Abnormal) Collected: 02/14/20 1143    Specimen: Blood Updated: 02/14/20 1200     Glucose POCT 110 mg/dL     Basic Metabolic Panel [[id number]]  (Abnormal) Collected: 02/14/20 0618    Specimen: Plasma Updated: 02/14/20 0706     Sodium 137 mMol/L      Potassium 3.4 mMol/L      Chloride 110 mMol/L      CO2 18.6 mMol/L  Calcium 8.3 mg/dL      Glucose 86 mg/dL      Creatinine 1.61 mg/dL      BUN 9 mg/dL      Anion Gap 09.6 mMol/L      BUN / Creatinine Ratio 15.8 Ratio      EGFR 105 mL/min/1.38m2      Osmolality Calculated 272 mOsm/kg     Magnesium [[id number]]  (Abnormal) Collected: 02/14/20 0618    Specimen: Plasma Updated: 02/14/20 0706     Magnesium 1.5 mg/dL     Phosphorus [[id number]]  (Abnormal) Collected: 02/14/20 0619    Specimen: Plasma Updated: 02/14/20 0706     Phosphorus 1.9 mg/dL     CBC and differential [[id number]] Collected: 02/14/20 0619    Specimen: Blood Updated: 02/14/20 0647     WBC 8.7 K/cmm      RBC 4.01 M/cmm      Hemoglobin 12.4 gm/dL      Hematocrit 46.9 %      MCV 96 fL      MCH 31 pg      MCHC 32 gm/dL      RDW 62.9 %      PLT CT 154 K/cmm      MPV 9.3 fL      Neutrophils % 72.5 %      Lymphocytes 18.1 %      Monocytes 8.3 %      Eosinophils % 0.2 %      Basophils % 0.9 %      Neutrophils Absolute 6.3 K/cmm       Lymphocytes Absolute 1.6 K/cmm      Monocytes Absolute 0.7 K/cmm      Eosinophils Absolute 0.0 K/cmm      Basophils Absolute 0.1 K/cmm     Dextrose Stick Glucose [[id number]] Collected: 02/14/20 0527    Specimen: Blood Updated: 02/14/20 0543     Glucose POCT 83 mg/dL           Radiologic study results reviewed by ED provider:    No results found.    Rendering Provider: Kathrynn Running, MD           Procedures / EKG       EKG (interpreted by ED physician):      Procedures           MDM:        MDM    Patient presents and appears to be in significant distress.  Differential diagnosis includes small bowel obstruction, diverticulitis, pyelonephritis, pancreatitis.     Diagnosis / Disposition:     Final Impression  1. SBO (small bowel obstruction)        Disposition  ED Disposition     ED Disposition Condition Date/Time Comment    Admit  Mon Feb 12, 2020  9:41 AM Service: Medicine [106]            Follow up Provider(s):  No follow-up provider specified.      Prescriptions  Current Discharge Medication List                 ______________________________    This document is generated from an EMR system and may have additions and omissions that were not intended by the user.                  Kathrynn Running, MD  02/15/20 [phone]

## 2020-02-12 NOTE — Anesthesia Preprocedure Evaluation (Addendum)
Anesthesia Evaluation    AIRWAY    Mallampati: III    TM distance: >3 FB  Neck ROM: full  Mouth Opening:full  Planned to use difficult airway equipment: No CARDIOVASCULAR    cardiovascular exam normal       DENTAL           PULMONARY    pulmonary exam normal     OTHER FINDINGS                  Relevant Problems   No relevant active problems   Additional PMH:  HTN  HLD  Hx Myocardial infarction - no more information in EMR re: this, patient states that this occurred "17 years ago and I wouldn't let them touch my heart", denies heart catheterization/stents/CABG, METS >4  COPD  Tobacco use   Seizure disorder on topamax   IDDM w/ glucose 518 in ED (hx of hyperglycemia and uncontrolled DM, prior glucose levels during ER visits in EMR with levels in 400-500 range)  Concern for DKA 2/2 to acute SBO- metabolic acidosis w/ ketones in serum and urine   Throat cancer 2008 with successful intubation following this documented in EMR - Mac 3/G1V/6.0 ETT     Hgb 17.3, Plt 235, Cr 1.05, Na 130, K 4.7, LA 2.3    No prior TTE or cardiac cath reports in EMR or care everywhere     Presenting today with SBO presenting for ex lap          Anesthesia Plan    ASA 3     general                     intravenous induction   Detailed anesthesia plan: general endotracheal      Post Op: plan for postoperative opioid use  Trial extubation is planned.  Post op pain management: per surgeon    informed consent obtained      pertinent labs reviewed      Patient seen and examined on date of surgery. Prior to the procedure, a full discussion was held with the patient regarding risk and benefits of the anesthetic including and not limited to dental injury, sore throat, nausea, vomiting, eye injury, mouth or lip trauma. Patient voiced understanding and signed consent. Due to the nature of the electronic medical record, some data may not be completely accurate due to artifact, failure of electronic medical record devices, inaccurate data entry by other  staff, etc.          Signed by: Rosaura Carpenter, DO 02/12/20 12:31 PM

## 2020-02-12 NOTE — Progress Notes (Signed)
PT admitted to [facility] #[id number]   Placed onto TELE #1 report called to [facility] at [facility] monitor RM

## 2020-02-12 NOTE — ACP (Advance Care Planning) (Signed)
Advance Care Planning Services    Patient Name: Tracy Bullock, Tracy Bullock      Counseling Provider: Chinita Greenland, MD   Primary Care Physician: Zollie Beckers, NP   Narrative of Meeting   Events prompting this discussion:  Small of obstruction     Major disease and the trajectory:  Prognosis is guarded     The types of care discussed:  Standard of care for disease     The Patient's/Surrogate's wishes and goals for treatment:  In the event of cardiac arrest, respiratory arrest, patient would like to be in full code including CPR, intubation, cardiac medication, pressor, and hemodialysis.  My clinical assessment and plan along with patient's prognosis were extensively explained to the patient who voiced understanding.  Patient is full code         Documents Filled Out As Part of Discussion:                None  Existing Documents that were reviewed and discussed with the patient/surrogate:  None   Acknowledgment of patient's right to decline advance care planning:  The patient has medical decision-making capacity and was represented by herself.  The patient/surrogate was informed that this session is completely voluntary and was given the opportunity to decline the ACP services. The patient/surrogate decided to participate in the ACP session with the discussion proceeding as described above.   I have spent 25 minutes on face-to-face Advance Care Planning Services   100% of the time was spent on discussion and counseling the patient.   No active management of the problems listed above was undertaken during the time period reported.       Medical decision-making capacity can be determined by patient's ability to have:  1.     Factual understanding of current situation and issues;   2.     Interpretation of the information presented.   3.     Reasoning ability about decisions/choices.  4.     Execution of voluntary decision without coercion.   Advance care planning:             Improves end of life care            Improves  patient & family satisfaction            Reduces stress, anxiety, & depression in surviving relatives  Reference:  Detering et al., BMJ 2010; 340 doi: PopPath.it (Published 13 June 2008)   Jolynne Spurgin Einar Crow, MD  02/12/2020 8:10 PM  MRN: [id number]                                       CSN: [id number]                            DOB: 11/26/1965

## 2020-02-12 NOTE — Plan of Care (Signed)
NURSE NOTE SUMMARY  [facility] - MEDICAL SURGICAL UNIT   Patient Name: Tracy Bullock, Tracy Bullock   Attending Physician: Alwyn Ren, MD   Today's date:   02/12/2020 LOS: 0 days   Shift Summary:                                                                   Provider Notifications:        Rapid Response Notifications:  Mobility:      PMP Activity: Step 3 - Bed Mobility (02/12/2020  6:06 PM)     Weight tracking:  Family Dynamic:   Last 3 Weights for the past 72 hrs (Last 3 readings):   Weight   02/12/20 0622 72.6 kg (160 lb)             Last Bowel Movement   No data recorded        Problem: Altered GI Function  Goal: Fluid and electrolyte balance are achieved/maintained  Outcome: Progressing  Flowsheets (Taken 02/12/2020 1814)  Fluid and electrolyte balance are achieved/maintained:   Monitor/assess lab values and report abnormal values   Monitor intake and output every shift   Assess and reassess fluid and electrolyte status   Provide adequate hydration  Goal: Elimination patterns are normal or improving  Outcome: Progressing  Flowsheets (Taken 02/12/2020 1814)  Elimination patterns are normal or improving:   Assess for normal bowel sounds   Monitor for abdominal distension   Monitor for abdominal discomfort  Goal: Nutritional intake is adequate  Outcome: Progressing  Flowsheets (Taken 02/12/2020 1814)  Nutritional intake is adequate: Allow adequate time for meals  Goal: Mobility/Activity is maintained at optimal level for patient  Outcome: Progressing  Flowsheets (Taken 02/12/2020 1814)  Mobility/activity is maintained at optimal level for patient:   Increase mobility as tolerated/progressive mobility   Maintain proper body alignment   Encourage independent activity per ability   Plan activities to conserve energy, plan rest periods  Goal: No bleeding  Outcome: Progressing  Flowsheets (Taken 02/12/2020 1814)  No bleeding: Monitor/assess lab values and report abnormal values

## 2020-02-12 NOTE — H&P (Signed)
Medicine History & Physical   [facility]  [employer]   Patient Name: Tracy Bullock, Tracy Bullock LOS: 0 days   Attending Physician: Alwyn Ren, MD PCP: Zollie Beckers, NP      Assessment and Plan:                                                                Acute small bowel obstruction due to adhesions status post lysis and abdominal exploration.  N.p.o.  Okay with sips of water and medication.  Further management per general surgeon.    Hyperglycemia in the setting of insulin-dependent type 2 diabetes.  Briefly on insulin drip but now off the insulin drip.   As per chart review from care everywhere, patient is taking NovoLog 70-30 65 units twice daily.  Which will come out to be 80 units of Lantus with 10 units of 3 times daily.  Since patient will be n.p.o., will start with 40 units of Lantus with sliding scale every 6 hours.  This will further need adjustment.    Acute transient hypoxia.  Likely due to surgery.  Anticipate weaning off very soon.  Incentive spirometry    Seizure.  History of diffuse large B cell lymphoma diagnosed in 2008  Hypertension.  PTSD  History of schizoaffective disorder  History of status migrainosus  Lipitor,hydroxyzine, lisinopril, Metformin, risperidone, Topamax, and trazodone.    DVT PPx: Mechanical   Dispo: Inpatient  Code: full code     History of Presenting Illness                                CC: Abdominal pain  Tracy Bullock is a 54 y.o. female patient who presented to ED with increasing abdominal pain associated with nausea vomiting for 3 days.  Patient has multiple prior abdominal surgeries.  Nonbloody emesis and no diarrhea.  Patient denied any fever, chill, chest pain, shortness of breath or sick contact.  In the ED, there was evidence of small of obstruction and adhesion.  General surgeon was consulted who took the patient to the OR for lysis of adhesion and exploration.  Hospitalist was called for medical management.  Because of  hyperglycemia in the ED, patient was briefly started on insulin drip and was discontinued  Past Medical History:   Diagnosis Date    Back pain     CHF (congestive heart failure)     Convulsions     COPD (chronic obstructive pulmonary disease)     Coronary artery disease     Diabetes mellitus     Hypertension     Learning disability     MI (myocardial infarction)     OSA (obstructive sleep apnea)      Past Surgical History:   Procedure Laterality Date    APPENDECTOMY      Open appendectomy.      CATARACT EXTRACTION      CESAREAN SECTION      C-sections x 3.      CHOLECYSTECTOMY      Open cholecystectomy.      HYSTERECTOMY      TAH-BSO.       History reviewed. No pertinent family history.  Social History  Tobacco Use    Smoking status: Current Every Day Smoker     Packs/day: 0.50     Types: Cigarettes    Smokeless tobacco: Never Used   Vaping Use    Vaping Use: Never used   Substance Use Topics    Alcohol use: Never    Drug use: Never       Subjective   Review of Systems:  All systems are reviewed and negative except for pertinent positive stated in HPI.        Objective   Physical Exam:     Vitals: T:99 F (37.2 C) (Temporal),  BP:118/78, HR:85, RR:16, SaO2:96%    1) General Appearance: Alert and oriented x 4.  In distress due to abdominal pain  2) Eyes: Pink conjunctiva, anicteric sclera. Pupils are equally reactive to light.  3) ENT: Oral mucosa moist with no pharyngeal congestion, erythema or swelling.  4) Neck: Supple, with full range of motion. Trachea is central, no JVD noted  5) Chest: Clear to auscultation bilaterally, no wheezes or rhonchi.  6) CVS: normal rate and regular rhythm, with no murmurs.  7) Abdomen: Absent bowel sounds.  Minimally distended.  Dressing over surgical site.  No purulent discharge.  Abdominal binder Foley catheter in place draining clear urine  8) Extremities: No pitting edema, pulses palpable, no calf swelling and no gross deformity.  9) Skin: Warm, dry with  normal skin turgor, no rash  10) Lymphatics: No lymphadenopathy in axillary, cervical and inguinal area.   11) Neurological: Cranial nerves II-XII intact. No gross focal motor or sensory deficits noted.  12) Psychiatric: Affect is appropriate. No hallucinations.    EKG: Per my review shows sinus rhythm    Patient Vitals for the past 12 hrs:   BP Temp Pulse Resp   02/12/20 1928 118/78 99 F (37.2 C) 85 16   02/12/20 1812 123/80 98.8 F (37.1 C) 86 17   02/12/20 1800 102/65  77 12   02/12/20 1750 112/74  77 15   02/12/20 1745 104/73  78 14   02/12/20 1740 107/67  79 22   02/12/20 1735 111/71  74 12   02/12/20 1730 117/73  80 12   02/12/20 1725 141/84  78 (!) 10   02/12/20 1720 (!) 148/94  80 (!) 25   02/12/20 1715 (!) 152/94  72 12   02/12/20 1710 (!) 156/95  78 17   02/12/20 1705 (!) 164/93  76 15   02/12/20 1700 156/89  76 15   02/12/20 1655 158/90  76 14   02/12/20 1650 (!) 157/91  76 14   02/12/20 1643 154/90 97 F (36.1 C) 79 17   02/12/20 1430 107/70  90 14   02/12/20 1409   69 19   02/12/20 1341 92/57  74    02/12/20 1258   70 16   02/12/20 1241 114/67  79    02/12/20 1112 101/67  77 17   02/12/20 1014 96/71  74 18   02/12/20 1012 99/68  87 15   02/12/20 0842   81 16   02/12/20 0841 119/81  89 14   02/12/20 0813 111/78  83 15     Weight Monitoring 02/12/2020   Height 157.5 cm   Height Method Stated   Weight 72.576 kg   Weight Method Stated   BMI (calculated) 29.3 kg/m2          Recent Results (from the past 24 hour(s))   Ketones,  blood    Collection Time: 02/12/20  6:34 AM   Result Value Ref Range    Ketone Small (A) Negative   CBC and differential    Collection Time: 02/12/20  6:34 AM   Result Value Ref Range    WBC 12.2 (H) 4.0 - 11.0 K/cmm    RBC 5.65 (H) 3.80 - 5.00 M/cmm    Hemoglobin 17.3 (H) 12.0 - 16.0 gm/dL    Hematocrit 16.1 (H) 36.0 - 48.0 %    MCV 94 80 - 100 fL    MCH 31 28 - 35 pg    MCHC 33 31 - 36 gm/dL    RDW 09.6 04.5 - 40.9 %    PLT CT 235 130 - 440 K/cmm     MPV 8.4 6.0 - 10.0 fL    Neutrophils % 82.3 (H) 42.0 - 78.0 %    Lymphocytes 11.8 (L) 15.0 - 46.0 %    Monocytes 5.6 3.0 - 15.0 %    Eosinophils % 0.0 0.0 - 7.0 %    Basophils % 0.3 0.0 - 3.0 %    Neutrophils Absolute 10.0 (H) 1.7 - 8.6 K/cmm    Lymphocytes Absolute 1.4 0.6 - 5.1 K/cmm    Monocytes Absolute 0.7 0.1 - 1.7 K/cmm    Eosinophils Absolute 0.0 0.0 - 0.8 K/cmm    Basophils Absolute 0.0 0.0 - 0.3 K/cmm   Comprehensive metabolic panel    Collection Time: 02/12/20  6:34 AM   Result Value Ref Range    Sodium 130 (L) 136 - 147 mMol/L    Potassium 4.7 3.5 - 5.3 mMol/L    Chloride 93 (L) 98 - 110 mMol/L    CO2 18.3 (L) 20.0 - 30.0 mMol/L    Calcium 10.0 8.5 - 10.5 mg/dL    Glucose 811 (H) 71 - 99 mg/dL    Creatinine 9.14 7.82 - 1.20 mg/dL    BUN 16 7 - 22 mg/dL    Protein, Total 7.2 6.0 - 8.3 gm/dL    Albumin 4.0 3.5 - 5.0 gm/dL    Alkaline Phosphatase 140 40 - 145 U/L    ALT 11 0 - 55 U/L    AST (SGOT) 13 10 - 42 U/L    Bilirubin, Total 0.8 0.1 - 1.2 mg/dL    Albumin/Globulin Ratio 1.25 0.80 - 2.00 Ratio    Anion Gap 23.4 (H) 7.0 - 18.0 mMol/L    BUN / Creatinine Ratio 15.2 10.0 - 30.0 Ratio    EGFR 60 60 - 150 mL/min/1.20m2    Osmolality Calculated 285 275 - 300 mOsm/kg    Globulin 3.2 2.0 - 4.0 gm/dL   Lipase    Collection Time: 02/12/20  6:34 AM   Result Value Ref Range    Lipase 13 8 - 78 U/L   Lactic Acid    Collection Time: 02/12/20  6:34 AM   Result Value Ref Range    Lactic Acid 2.3 (HH) 0.5 - 1.9 mMol/L   ECG 12 lead    Collection Time: 02/12/20  7:16 AM   Result Value Ref Range    Patient Age 15 years    Patient DOB 1965-07-06     Patient Height      Patient Weight      Interpretation Text       Sinus rhythm  Probable left atrial enlargement  Low voltage, extremity leads  No previous ECG available for comparison      Physician Interpreter  Ventricular Rate 76 //min    QRS Duration 90 ms    P-R Interval 174 ms    Q-T Interval 399 ms    Q-T Interval(Corrected) 449 ms    P Wave Axis 72 deg    QRS Axis  22 deg    T Axis 53 years   Urinalysis w Microscopic and Culture if Indicated    Collection Time: 02/12/20  7:45 AM    Specimen: Urine, Random   Result Value Ref Range    Color, UA Yellow Colorless,Light-Yellow,Yellow    Clarity, UA Clear Clear    Urine Specific Gravity 1.020 1.001 - 1.040    pH, Urine 5.5 5.0 - 8.0 pH    Protein, UR Negative Negative,Trace mg/dL    Glucose, UA >=5409 (A) Negative mg/dL    Ketones UA 40 (A) Negative mg/dL    Bilirubin, UA Negative Negative mg/dL    Blood, UA Negative Negative mg/dL    Nitrite, UA Negative Negative    Urobilinogen, UA 0.2 0.2 mg/dL    Leukocyte Esterase, UA Negative Negative Leu/uL    UR Micro Not Indicated    Respiratory Specimen Xpert Xpress(R) SARS-CoV-2 Qualitative PCR    Collection Time: 02/12/20 12:08 PM    Specimen: Nasopharyngeal Swab   Result Value Ref Range    SARS CoV-2 by PCR Negative Negative    Does patient have symptoms related to condition of interest? Y     Is the patient hospitalized because of this condition? Y     Is patient admitted to the intensive care unit? 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 Y     Is the patient pregnant? N    B-type Natriuretic Peptide    Collection Time: 02/12/20 12:54 PM   Result Value Ref Range    B-Natriuretic Peptide 12.1 0.0 - 100.0 pg/mL   Dextrose Stick Glucose    Collection Time: 02/12/20  1:12 PM   Result Value Ref Range    Glucose POCT 302 (H) 71 - 99 mg/dL   Comprehensive metabolic panel    Collection Time: 02/12/20  2:53 PM   Result Value Ref Range    Sodium 136 136 - 147 mMol/L    Potassium 4.7 3.5 - 5.3 mMol/L    Chloride 101 98 - 110 mMol/L    CO2 24.5 20.0 - 30.0 mMol/L    Calcium 9.3 8.5 - 10.5 mg/dL    Glucose 811 (H) 71 - 99 mg/dL    Creatinine 9.14 7.82 - 1.20 mg/dL    BUN 16 7 - 22 mg/dL    Protein, Total 6.2 6.0 - 8.3 gm/dL    Albumin 3.2 (L) 3.5 - 5.0 gm/dL    Alkaline Phosphatase 130 40 - 145 U/L    ALT 33 0 - 55 U/L    AST (SGOT)  167 (H) 10 - 42 U/L    Bilirubin, Total 1.6 (H) 0.1 - 1.2 mg/dL    Albumin/Globulin Ratio 1.07 0.80 - 2.00 Ratio    Anion Gap 15.2 7.0 - 18.0 mMol/L    BUN / Creatinine Ratio 20.5 10.0 - 30.0 Ratio    EGFR 86 60 - 150 mL/min/1.19m2    Osmolality Calculated 285 275 - 300 mOsm/kg    Globulin 3.0 2.0 - 4.0 gm/dL   Dextrose Stick Glucose    Collection Time: 02/12/20  3:36 PM   Result Value Ref Range  Glucose POCT 315 (H) 71 - 99 mg/dL   Dextrose Stick Glucose    Collection Time: 02/12/20  4:45 PM   Result Value Ref Range    Glucose POCT 276 (H) 71 - 99 mg/dL   Dextrose Stick Glucose    Collection Time: 02/12/20  5:49 PM   Result Value Ref Range    Glucose POCT 277 (H) 71 - 99 mg/dL        Allergies   Allergen Reactions    Penicillins     Toradol Oral [Ketorolac Tromethamine] Rash    Tramadol Rash      CT Abdomen Pelvis with IV Cont    Result Date: 02/12/2020  Distal small bowel obstruction with transition point in the upper right anterior pelvis, presumably secondary to postsurgical adhesions. Mild small bowel dilatation measuring up to 3.6 cm. Small volume of free fluid in the pelvis. ReadingStation:[id number]     Home Medications     Med List Status: Complete Set By: Herbert Spires, RN at 02/12/2020  6:36 PM                Albuterol Sulfate 108 (90 Base) MCG/ACT Aerosol Pwdr, Breath Activated     Inhale 2 puffs into the lungs every 4 (four) hours     aspirin 81 MG EC tablet     Take 81 mg by mouth daily     atorvastatin (LIPITOR) 20 MG tablet     atorvastatin 20 mg tablet     atorvastatin (LIPITOR) 20 MG tablet     Take 20 mg by mouth daily     busPIRone (BUSPAR) 10 MG tablet     Take 10 mg by mouth daily     citalopram (CeleXA) 40 MG tablet     Take 40 mg by mouth daily     divalproex, ER, extended release (DEPAKOTE ER) 500 MG 24 hr tablet     Take 500 mg by mouth 2 (two) times daily     fluconazole (DIFLUCAN) 150 MG tablet     Take 150 mg by mouth daily     Galcanezumab-gnlm (Emgality) 120 MG/ML Solution  Auto-injector     Inject 120 mg into the skin daily     hydrOXYzine (ATARAX) 25 MG tablet     Take 25 mg by mouth daily     insulin aspart (NovoLOG FlexPen) 100 UNIT/ML injection pen     Inject 100 Units into the skin daily     lamoTRIgine (LaMICtal) 25 MG disintegrating tablet     Take 25 mg by mouth daily     lisinopril (ZESTRIL) 20 MG tablet     Take 20 mg by mouth daily     metFORMIN (GLUCOPHAGE) 1000 MG tablet     Take 1,000 mg by mouth daily     risperiDONE (RisperDAL) 1 MG tablet     Take 1 mg by mouth daily     topiramate (TOPAMAX) 50 MG tablet     Take 50 mg by mouth daily     traZODone (DESYREL) 100 MG tablet     Take 200 mg by mouth nightly         Meds given in the ED:  Medications   atorvastatin (LIPITOR) tablet 20 mg (has no administration in time range)   busPIRone (BUSPAR) tablet 10 mg (has no administration in time range)   citalopram (CeleXA) tablet 40 mg (has no administration in time range)   divalproex (ER) extended release (DEPAKOTE ER) 24  hr tablet 500 mg (has no administration in time range)   lamoTRIgine (LaMICtal) tablet 25 mg (has no administration in time range)   lisinopril (ZESTRIL) tablet 20 mg (has no administration in time range)   risperiDONE (RisperDAL) tablet 1 mg (has no administration in time range)   topiramate (TOPAMAX) tablet 50 mg (has no administration in time range)   traZODone (DESYREL) tablet 200 mg (has no administration in time range)   sodium chloride (PF) 0.9 % injection 3 mL (has no administration in time range)   0.9% NaCl infusion ( Intravenous New Bag 02/12/20 1822)   naloxone [facility]) injection 0.4 mg (has no administration in time range)   ondansetron (ZOFRAN) injection 4 mg (has no administration in time range)   enoxaparin (LOVENOX) syringe 40 mg (40 mg Subcutaneous Given 02/12/20 1647)   HYDROmorphone (DILAUDID) 0.2 mg/mL 50 mL [place] ( Intravenous Handoff/Dual RN Verify 02/12/20 1911)   0.9% NaCl infusion (has no administration in time range)   naloxone  [facility]) injection 0.4 mg (has no administration in time range)   dextrose (D10W) 10% bolus 125 mL (has no administration in time range)   glucagon (rDNA) (GLUCAGEN) injection 1 mg (has no administration in time range)   insulin lispro (1 Unit Dial) (HumaLOG) injection pen 2-24 Units (has no administration in time range)   pantoprazole (PROTONIX) injection 40 mg (has no administration in time range)   diphenhydrAMINE (BENADRYL) injection 12.5 mg (12.5 mg Intravenous Given 02/12/20 1831)   ondansetron (ZOFRAN) injection 4 mg (4 mg Intravenous Given 02/12/20 0728)   HYDROmorphone (DILAUDID) injection 1 mg (1 mg Intravenous Given 02/12/20 0731)   sodium chloride 0.9 % bolus 2,000 mL (0 mLs Intravenous Stopped 02/12/20 0857)   iohexol (OMNIPAQUE) 350 MG/ML injection 100 mL (100 mLs Intravenous Imaging Agent Given 02/12/20 0802)   HYDROmorphone (DILAUDID) injection 0.5 mg (0.5 mg Intravenous Given 02/12/20 0946)   insulin lispro (1 Unit Dial) (HumaLOG) injection pen 7 Units (7 Units Subcutaneous Given 02/12/20 1213)   HYDROmorphone (DILAUDID) injection 1 mg (1 mg Intravenous Given 02/12/20 1212)   diphenhydrAMINE (BENADRYL) injection 25 mg (25 mg Intravenous Given 02/12/20 1254)   ciprofloxacin (CIPRO) 400mg  in D5W IVPB (premix) (400 mg Intravenous Given 02/12/20 1456)   metroNIDAZOLE (FLAGYL) 500mg  in NS IVPB (premix) (500 mg Intravenous Given 02/12/20 1455)      Time Spent:     Chinita Greenland, MD     02/12/20,7:48 PM   MRN: [id number]                                      CSN: [id number] DOB: November 04, 1965

## 2020-02-12 NOTE — H&P (Signed)
HISTORY  &  PHYSICAL    GENERAL  SURGERY      Date Time:   02/12/20     12:05 PM    Patient Name:  Tracy Bullock, Tracy Bullock    Assessment:       --   Acute abdomen -- SBO?      Patient needs a more urgent abdominal exploration.      I had a long discussion with the patient regarding her situation.  I outlined the nature of an abdominal exploration.      Risks (include, but are not limited to:  bleeding, infection, leakage of bowel suture or staple line, injury to bowel, nerve, blood vessel, spleen, ureter, pain, hernia, and poor wound healing) and benefits were reviewed.      She may need external surgical drains or tubes placed into the abdomen.  She may require a urinary catheter for a day or two following the surgery.      She will likely need to stay in the hospital for several days post-operatively.   She asked appropriate questions, and all were answered.  She wishes to proceed as outlined.        --   Multiple medical problems -- will consult hospitalist.            Plan:       --   Plan for OR today     Abdominal exploration -- possible SBO vs acute abdomen         --   Admit to hospital, in-patient status.       --   Analgesics and antibiotics.      --   DVT prophylaxis with PAS boots and Lovenox.      --   Hospitalist consultation.      --   Physical therapy consultation.          History of Present Illness:     The patient provides her own medical history, and she seems like a marginal historian.      The patient is intellectually challenged.  She remains quite emotional, and she is difficult to examine.  She c/o excruciating and unrelenting abdominal pain.      Tracy Bullock is a 54 y.o. female who presents to the hospital ED with increasing abdominal pain x 3 days.  She has nausea and vomiting x 3 days.   Discomfort has increased and remains generalized.  Never had symptoms like this before.       No hematemesis.  No diarrhea.  No blood in stools.  No fever or chills.      She has had multiple prior  abdominal operations.      A CT scan was obtained, and it demonstrates findings suspicious for SBO.   I personally reviewed the CT scan images.  I discussed the CT scan findings with the patient.      WCT is mildly elevated.      The patient's prior available medical records were reviewed.         H/o open CCY, open appy, TAH-BSO, and C-sections x 3.      I had a discussion with the ED provider regarding this patient.          Review of Systems:       CONSTITUTIONAL:   No fever or chills.  No night sweats.  No recent unexplained weight loss.      EYES:   No recent visual changes.  No eye  pain.      HEENT:   No sinus trouble or headache.  No masses or lumps.  No trouble swallowing.      CARDIOVASCULAR:   H/o HTN, CHF, MI, & CAD.       RESPIRATORY:   Has OSA & COPD.        GASTROINTESTINAL:   Please see HPI.   No GI bleeding.      GENITOURINARY:   No dysuria.  No renal insufficiency.  No kidney stones.      MUSCULOSKELETAL:   No recent major joint replacements.  No new-onset bone or joint pain.  No recent trauma.      SKIN & BREAST:   No new skin lesions or rashes.  No pruritus.  No breast pain or lumps.      NEUROLOGIC:   Has h/o Sz disorder.  No stroke, paralysis, numbness, or weakness.      PSYCHIATRIC:   No recent-onset anxiety, depression, or suicidal thoughts.      ENDOCRINE:   Has diabetes.   No thyroid disease.   No adrenal disease.      HEMATOLOGIC & LYMPHATIC:   No easy bruising or bleeding.  No lymph-node enlargement.  No use of blood-thinners.      IMMUNOLOGIC:   No hives.  No h/o anaphylaxis.           Physical Exam:     Vitals:    02/12/20 1112   BP: 101/67   Pulse: 77   Resp: 17   Temp:    SpO2: 90%       Intake and Output Summary (Last 24 hours) at Date Time    Intake/Output Summary (Last 24 hours) at 02/12/2020 1205  Last data filed at 02/12/2020 0857  Gross per 24 hour   Intake 2000 ml   Output    Net 2000 ml           GENERAL:  Awake and cooperative.  Good memory and judgement.    NEURO:  Moves all  4 extremities.  Ambulatory.      HEENT:  Sclera is white.  Conjunctiva pink.   No icterus.  Atraumatic scalp.      NECK:  Supple.  No adenopathy.      CHEST:  Clear bilaterally.  No wheezes.      CARDIAC:  Regular.  Normal cardiac sounds.  No murmur.      ABDOMEN:  Soft.  Mild distention.  Significant generalized abdominal tenderness is noted.  Peritoneal irritation seems to be present.  No hernias.  No abdominal masses.  Multiple well-healed surgical incisions.      BACK:  Straight.  No deformities.      EXTREMITIES:  No pedal edema.   No calf tenderness.          Past Medical History:     Past Medical History:   Diagnosis Date    Back pain     CHF (congestive heart failure)     Convulsions     COPD (chronic obstructive pulmonary disease)     Coronary artery disease     Diabetes mellitus     Hypertension     Learning disability     MI (myocardial infarction)     OSA (obstructive sleep apnea)        Past Surgical History:     Past Surgical History:   Procedure Laterality Date    APPENDECTOMY      Open appendectomy.  CATARACT EXTRACTION      CESAREAN SECTION      C-sections x 3.      CHOLECYSTECTOMY      Open cholecystectomy.      HYSTERECTOMY      TAH-BSO.         Medications:     Prior to Admission medications    Medication Sig Start Date End Date Taking? Authorizing Provider   aspirin 81 MG EC tablet Take 81 mg by mouth daily   Yes [provider]   atorvastatin (LIPITOR) 20 MG tablet Take 20 mg by mouth daily 07/18/19  Yes [provider]   busPIRone (BUSPAR) 10 MG tablet Take 10 mg by mouth daily   Yes [provider]   hydrOXYzine (ATARAX) 25 MG tablet Take 25 mg by mouth daily 10/10/18  Yes [provider]   insulin aspart (NovoLOG FlexPen) 100 UNIT/ML injection pen Inject 100 Units into the skin daily 10/04/18  Yes [provider]   lisinopril (ZESTRIL) 20 MG tablet Take 20 mg by mouth daily   Yes [provider]   metFORMIN (GLUCOPHAGE)  1000 MG tablet Take 1,000 mg by mouth daily   Yes [provider]   risperiDONE (RisperDAL) 1 MG tablet Take 1 mg by mouth daily 12/29/19  Yes [provider]   traZODone (DESYREL) 100 MG tablet Take 200 mg by mouth nightly   Yes [provider]   Albuterol Sulfate 108 (90 Base) MCG/ACT Aerosol Pwdr, Breath Activated Inhale 2 puffs into the lungs every 4 (four) hours    [provider]   atorvastatin (LIPITOR) 20 MG tablet atorvastatin 20 mg tablet 04/07/18   [provider]   citalopram (CeleXA) 40 MG tablet Take 40 mg by mouth daily    [provider]   divalproex, ER, extended release (DEPAKOTE ER) 500 MG 24 hr tablet Take 500 mg by mouth 2 (two) times daily    [provider]   fluconazole (DIFLUCAN) 150 MG tablet Take 150 mg by mouth daily 04/27/19   [provider]   Galcanezumab-gnlm (Emgality) 120 MG/ML Solution Auto-injector Inject 120 mg into the skin daily    [provider]   lamoTRIgine (LaMICtal) 25 MG disintegrating tablet Take 25 mg by mouth daily    [provider]   topiramate (TOPAMAX) 50 MG tablet Take 50 mg by mouth daily    [provider]          Family History:     History reviewed. No pertinent family history.    Social History:     Social History     Socioeconomic History    Marital status: Single     Spouse name: Not on file    Number of children: Not on file    Years of education: Not on file    Highest education level: Not on file   Occupational History    Not on file   Tobacco Use    Smoking status: Current Every Day Smoker     Packs/day: 0.50     Types: Cigarettes    Smokeless tobacco: Never Used   Vaping Use    Vaping Use: Never used   Substance and Sexual Activity    Alcohol use: Never    Drug use: Never    Sexual activity: Not on file   Other Topics Concern    Not on file   Social History Narrative    Not on file  Social Determinants of Health     Financial Resource  Strain:     Difficulty of Paying Living Expenses: Not on file   Food Insecurity:     Worried About Programme researcher, broadcasting/film/video in the Last Year: Not on file    [employer] of Food in the Last Year: Not on file   Transportation Needs:     Lack of Transportation (Medical): Not on file    Lack of Transportation (Non-Medical): Not on file   Physical Activity:     Days of Exercise per Week: Not on file    Minutes of Exercise per Session: Not on file   Stress:     Feeling of Stress : Not on file   Social Connections:     Frequency of Communication with Friends and Family: Not on file    Frequency of Social Gatherings with Friends and Family: Not on file    Attends Religious Services: Not on file    Active Member of Clubs or Organizations: Not on file    Attends Banker Meetings: Not on file    Marital Status: Not on file   Intimate Partner Violence:     Fear of Current or Ex-Partner: Not on file    Emotionally Abused: Not on file    Physically Abused: Not on file    Sexually Abused: Not on file   Housing Stability:     Unable to Pay for Housing in the Last Year: Not on file    Number of Places Lived in the Last Year: Not on file    Unstable Housing in the Last Year: Not on file       Allergies:     Allergies   Allergen Reactions    Penicillins        Labs:     Results     Procedure Component Value Units Date/Time    Urinalysis w Microscopic and Culture if Indicated [[id number]] Collected: 02/12/20 0745    Specimen: Urine, Random Updated: 02/12/20 1150    Lactic Acid [[id number]]  (Abnormal) Collected: 02/12/20 0634    Specimen: Blood Updated: 02/12/20 0830     Lactic Acid 2.3 mMol/L     Comprehensive metabolic panel [[id number]]  (Abnormal) Collected: 02/12/20 0634    Specimen: Plasma Updated: 02/12/20 0744     Sodium 130 mMol/L      Potassium 4.7 mMol/L      Chloride 93 mMol/L      CO2 18.3 mMol/L      Calcium 10.0 mg/dL      Glucose 086 mg/dL      Creatinine 5.78 mg/dL      BUN 16 mg/dL      Protein,  Total 7.2 gm/dL      Albumin 4.0 gm/dL      Alkaline Phosphatase 140 U/L      ALT 11 U/L      AST (SGOT) 13 U/L      Bilirubin, Total 0.8 mg/dL      Albumin/Globulin Ratio 1.25 Ratio      Anion Gap 23.4 mMol/L      BUN / Creatinine Ratio 15.2 Ratio      EGFR 60 mL/min/1.35m2      Osmolality Calculated 285 mOsm/kg      Globulin 3.2 gm/dL     Lipase [[id number]] Collected: 02/12/20 0634    Specimen: Plasma Updated: 02/12/20 0744     Lipase 13 U/L     Ketones, blood [[id number]]  (Abnormal)  Collected: 02/12/20 0634    Specimen: Blood Updated: 02/12/20 0734     Ketone Small    CBC and differential [[id number]]  (Abnormal) Collected: 02/12/20 0634    Specimen: Blood Updated: 02/12/20 0724     WBC 12.2 K/cmm      RBC 5.65 M/cmm      Hemoglobin 17.3 gm/dL      Hematocrit 62.9 %      MCV 94 fL      MCH 31 pg      MCHC 33 gm/dL      RDW 52.8 %      PLT CT 235 K/cmm      MPV 8.4 fL      Neutrophils % 82.3 %      Lymphocytes 11.8 %      Monocytes 5.6 %      Eosinophils % 0.0 %      Basophils % 0.3 %      Neutrophils Absolute 10.0 K/cmm      Lymphocytes Absolute 1.4 K/cmm      Monocytes Absolute 0.7 K/cmm      Eosinophils Absolute 0.0 K/cmm      Basophils Absolute 0.0 K/cmm               Rads:     Radiological Procedure reviewed.    @RAD7DAY @        Signed by:   Dineen Kid, MD        Although significant efforts were made to ensure accuracy of spelling and grammar, todays note was completed in large part by speech/voice recognition Chemical engineer) software and by keyboarding information into the electronic health care record in real time.  As such, there may be errors in grammar and spelling, insertion of incorrect words/phrases, pronoun errors, etc., that should be disregarded when reviewing this note.    JP

## 2020-02-12 NOTE — Anesthesia Postprocedure Evaluation (Signed)
Anesthesia Post Evaluation    Patient: Tracy Bullock    Procedures performed: Procedure(s):  EXPLORATORY LAPAROTOMY    Anesthesia type: General ETT    Patient location:PACU    Last vitals:   BP: 117/73  Heart Rate: 80  Resp Rate: 12  Temp: 36.1 C (97 F)  SpO2 Readings from Last 1 Encounters:   02/12/20 95%       Post pain: Patient not complaining of pain, continue current therapy      Mental Status:awake and alert     Respiratory Function: tolerating nasal cannula    Cardiovascular: stable    Nausea/Vomiting: patient not complaining of nausea or vomiting    Hydration Status: adequate    Post assessment:   BG 276 in PACU, currently on insulin infusion at 2 U/hr given concern for DKA. Electrolytes stable. Pain is well-controlled at this point and pt is awake/alert. She is appropriate for transfer from PACU to step down unit for further monitoring.         Signed by: Conley Canal, MD, 02/12/2020 5:37 PM

## 2020-02-13 ENCOUNTER — Encounter: Payer: Self-pay | Admitting: Specialist

## 2020-02-13 LAB — CBC AND DIFFERENTIAL
Basophils %: 0.9 % (ref 0.0–3.0)
Basophils Absolute: 0.1 10*3/uL (ref 0.0–0.3)
Basophils Absolute: 0 10*3/uL (ref 0.0–0.3)
Eosinophils %: 0.1 % (ref 0.0–7.0)
Eosinophils %: 2 % (ref 0.0–7.0)
Eosinophils Absolute: 0 10*3/uL (ref 0.0–0.8)
Eosinophils Absolute: 0.2 10*3/uL (ref 0.0–0.8)
Hematocrit: 47.6 % (ref 36.0–48.0)
Hematocrit: 46.6 % (ref 36.0–48.0)
Hemoglobin: 15.4 gm/dL (ref 12.0–16.0)
Hemoglobin: 16.1 gm/dL — ABNORMAL HIGH (ref 12.0–16.0)
Lymphocytes Absolute: 2.2 10*3/uL (ref 0.6–5.1)
Lymphocytes Absolute: 2.9 10*3/uL (ref 0.6–5.1)
Lymphocytes: 20.3 % (ref 15.0–46.0)
Lymphocytes: 26 % (ref 15.0–46.0)
MCH: 30 pg (ref 28–35)
MCH: 34 pg (ref 28–35)
MCHC: 32 gm/dL (ref 31–36)
MCHC: 34 gm/dL (ref 31–36)
MCV: 94 fL (ref 80–100)
MCV: 98 fL (ref 80–100)
MPV: 9.7 fL (ref 6.0–10.0)
MPV: 10.7 fL — ABNORMAL HIGH (ref 6.0–10.0)
Monocytes Absolute: 1.3 10*3/uL (ref 0.1–1.7)
Monocytes Absolute: 1 10*3/uL (ref 0.1–1.7)
Monocytes: 11.8 % (ref 3.0–15.0)
Monocytes: 9 % (ref 3.0–15.0)
Neutrophils %: 66.9 % (ref 42.0–78.0)
Neutrophils %: 63 % (ref 42.0–78.0)
Neutrophils Absolute: 7.2 10*3/uL (ref 1.7–8.6)
Neutrophils Absolute: 7 10*3/uL (ref 1.7–8.6)
PLT CT: 199 10*3/uL (ref 130–440)
PLT CT: 188 10*3/uL (ref 130–440)
RBC: 5.07 10*6/uL — ABNORMAL HIGH (ref 3.80–5.00)
RBC: 4.76 10*6/uL (ref 3.80–5.00)
RDW: 11.9 % (ref 10.5–14.5)
RDW: 11.6 % (ref 10.5–14.5)
WBC: 10.8 10*3/uL (ref 4.0–11.0)
WBC: 11.1 10*3/uL — ABNORMAL HIGH (ref 4.0–11.0)

## 2020-02-13 LAB — BASIC METABOLIC PANEL
Anion Gap: 12.9 mMol/L (ref 7.0–18.0)
Anion Gap: 11.2 mMol/L (ref 7.0–18.0)
BUN / Creatinine Ratio: 22.7 Ratio (ref 10.0–30.0)
BUN / Creatinine Ratio: 20.3 Ratio (ref 10.0–30.0)
BUN: 15 mg/dL (ref 7–22)
BUN: 14 mg/dL (ref 7–22)
CO2: 20.3 mMol/L (ref 20.0–30.0)
CO2: 22.7 mMol/L (ref 20.0–30.0)
Calcium: 9 mg/dL (ref 8.5–10.5)
Calcium: 8.7 mg/dL (ref 8.5–10.5)
Chloride: 105 mMol/L (ref 98–110)
Chloride: 106 mMol/L (ref 98–110)
Creatinine: 0.66 mg/dL (ref 0.60–1.20)
Creatinine: 0.69 mg/dL (ref 0.60–1.20)
EGFR: 100 mL/min/{1.73_m2} (ref 60–150)
EGFR: 98 mL/min/{1.73_m2} (ref 60–150)
Glucose: 205 mg/dL — ABNORMAL HIGH (ref 71–99)
Glucose: 163 mg/dL — ABNORMAL HIGH (ref 71–99)
Osmolality Calculated: 275 mOsm/kg (ref 275–300)
Osmolality Calculated: 276 mOsm/kg (ref 275–300)
Potassium: 4.2 mMol/L (ref 3.5–5.3)
Potassium: 3.9 mMol/L (ref 3.5–5.3)
Sodium: 134 mMol/L — ABNORMAL LOW (ref 136–147)
Sodium: 136 mMol/L (ref 136–147)

## 2020-02-13 LAB — MAGNESIUM: Magnesium: 1.6 mg/dL (ref 1.6–2.6)

## 2020-02-13 LAB — VH DEXTROSE STICK GLUCOSE
Glucose POCT: 188 mg/dL — ABNORMAL HIGH (ref 71–99)
Glucose POCT: 153 mg/dL — ABNORMAL HIGH (ref 71–99)
Glucose POCT: 139 mg/dL — ABNORMAL HIGH (ref 71–99)
Glucose POCT: 113 mg/dL — ABNORMAL HIGH (ref 71–99)
Glucose POCT: 91 mg/dL (ref 71–99)

## 2020-02-13 LAB — ECG 12-LEAD
P Wave Axis: 72 deg
P-R Interval: 174 ms
Patient Age: 54 years
Q-T Interval(Corrected): 449 ms
Q-T Interval: 399 ms
QRS Axis: 22 deg
QRS Duration: 90 ms
T Axis: 53 years
Ventricular Rate: 76 //min

## 2020-02-13 LAB — LACTIC ACID, PLASMA
Lactic Acid: 2.3 mMol/L (ref 0.5–1.9)
Lactic Acid: 1.3 mMol/L (ref 0.5–1.9)

## 2020-02-13 LAB — HEMOGLOBIN A1C: Hgb A1C, %: >14 %

## 2020-02-13 MED ORDER — LACTATED RINGERS IV BOLUS
1000.0000 mL | Freq: Once | INTRAVENOUS | Status: AC
Start: 2020-02-13 — End: 2020-02-13
  Administered 2020-02-13: 18:00:00 1000 mL via INTRAVENOUS

## 2020-02-13 MED ORDER — ALBUTEROL-IPRATROPIUM 2.5-0.5 (3) MG/3ML IN SOLN
3.0000 mL | Freq: Four times a day (QID) | RESPIRATORY_TRACT | Status: AC
Start: 2020-02-13 — End: 2020-02-13
  Administered 2020-02-13 (×4): 3 mL via RESPIRATORY_TRACT
  Filled 2020-02-13 (×4): qty 3

## 2020-02-13 MED ORDER — SODIUM CHLORIDE 0.9 % IV BOLUS
500.0000 mL | Freq: Once | INTRAVENOUS | Status: AC
Start: 2020-02-13 — End: 2020-02-13
  Administered 2020-02-13: 06:00:00 500 mL via INTRAVENOUS

## 2020-02-13 MED ORDER — SODIUM CHLORIDE 0.9 % IV BOLUS
500.0000 mL | Freq: Once | INTRAVENOUS | Status: AC
Start: 2020-02-13 — End: 2020-02-13
  Administered 2020-02-13: 16:00:00 500 mL via INTRAVENOUS

## 2020-02-13 MED ORDER — SODIUM CHLORIDE 0.9 % IV BOLUS
500.0000 mL | Freq: Once | INTRAVENOUS | Status: AC
Start: 2020-02-13 — End: 2020-02-13
  Administered 2020-02-13: 05:00:00 500 mL via INTRAVENOUS

## 2020-02-13 MED ORDER — INSULIN LISPRO (1 UNIT DIAL) 100 UNIT/ML SC SOPN
2.0000 [IU] | PEN_INJECTOR | Freq: Four times a day (QID) | SUBCUTANEOUS | Status: DC
Start: 2020-02-13 — End: 2020-02-14
  Administered 2020-02-13: 06:00:00 2 [IU] via SUBCUTANEOUS

## 2020-02-13 MED ORDER — VH HYDROMORPHONE 0.2 MG/ML 50 ML CASSETTE
Status: DC
Start: 2020-02-13 — End: 2020-02-14
  Administered 2020-02-13: 16:00:00 10 mg via INTRAVENOUS
  Filled 2020-02-13: qty 50

## 2020-02-13 NOTE — Progress Note - Problem Oriented Charting Notewrit (Signed)
Foley was taken out in the morning since patient with complaint of discomfort. IV fluids have been running at 150 cc/h. Nurse informed me around 530 this evening that patient has not urinated. Bladder scan not detecting accurately given dressing due to recent surgery. Patient is refusing Foley at this time. Patient was explained extensively the importance of not having urinary retention. She said' I will hold my legs tight and not let you put a catheter'. During our conversation she urinated about 100 to 200 cc of urine, collected through [facility]. Few episodes of borderline blood pressure noted but patient still maintaining her map in 80s to 90s. We will get another fluid bolus and continue hydration at 150 cc an hour. Will sign out to the nocturnist to determine if patient requires a Foley catheter. Nurses have been informed about strict I's and O's. Patient is alert appropriately responding to questions interactive.

## 2020-02-13 NOTE — Progress Notes (Addendum)
Rounded with MD and primary nurse  PCA adjusted.  DM educ consulted.  Pt/OT in working with pt. NPO, IVF. PO SBO s/p exploration w/LOA 11/22.  Have attempted to see pt a couple of times, resting, did not disturb      Readmission Risk  [facility] - MEDICAL SURGICAL UNIT   Patient Name: Tracy Bullock   Attending Physician: Alwyn Ren, MD   Today's date:   02/13/2020 LOS: 1 days   Expected Discharge Date      Readmission Assessment:                                                              Discharge Planning  ReAdmit Risk Score: 11  Does the patient have perscription coverage?: Yes  CM Comments: 11/23-need to f/u with pt. ? d/c  needs.  PCA pain control.       IDPA:   Patient Type  Within 30 Days of Previous Admission?: No (Admit with Acute small bowel obstruction due to adhesions s/p  lysis and abdominal exploration.  NPO, IVF, pain control.  Insured with [employer] mcaid)  Healthcare Decisions  Interviewed::  (review OT notes)  Advance Directive: Patient does not have advance directive  Healthcare Agent Appointed: No  Prior to admission  Prior level of function: Independent with ADLs,Ambulates independently  Type of Residence: Private residence  Home Layout: Other (Comment) (single level apt)  Have running water, electricity, heat, etc?: Yes  Living Arrangements: Friends (lives with roommate Mellody Dance (works))  Dressing: Independent  Grooming: Independent  Feeding: Independent  Bathing: Independent  Toileting: Independent  DME Currently at Home: CPAP/BIPAP (per therapy note, pt's cpap in [place])  Discharge Planning  Support Systems: Friends/neighbors  Patient expects to be discharged to:: home  Mode of transportation:: Private car (friend)  Does the patient have perscription coverage?: Yes  Consults/Providers  PT Evaluation Needed: No  OT Evalulation Needed: No  SLP Evaluation Needed: No  Correct PCP listed in Epic?: Yes (D Thacker.  Surgeon Dr Laurell Roof,   Neuro at [facility].)  Family  and PCP  PCP on file was verified as the current PCP?: Yes   30 Day Readmission:       Provider Notifications:
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
## 2020-02-13 NOTE — Progress Notes (Addendum)
Medicine Progress Note   [facility]  [employer]   Patient Name: SEIRRA, KOS LOS: 1 days   Attending Physician: Alwyn Ren, MD PCP: Zollie Beckers, NP      Hospital Course:                                                            NAESHA BUCKALEW is a 54 y.o. female patient who presented to ED with increasing abdominal pain associated with nausea vomiting for 3 days.  Patient has multiple prior abdominal surgeries.  Nonbloody emesis and no diarrhea.  Patient denied any fever, chill, chest pain, shortness of breath or sick contact.  In the ED, there was evidence of small of obstruction and adhesion.  General surgeon was consulted who took the patient to the OR for lysis of adhesion and exploration.  Hospitalist was called for medical management.     Patient was placed briefly on insulin drip for hyperglycemia in the OR and insulin drip was stopped in PACU.  Patient is still n.p.o. as per general surgery recommendation except meds with small sips of water.  Since patient is n.p.o. Lantus was reduced to 50% of dosage compared to her home dosing in addition to sliding scale.  Rapid response was called today in the morning for a brief episode of presyncope.  Possibly orthostatic, patient continued on IV fluids, orthostats every morning.  Patient has multiple psychiatric problems including PTSD and schizoaffective disorder.  Her presyncope with some confusion is possibly also contributed by her anxiety.  Patient was hemodynamically stable during the rapid response.  Blood sugar was within normal limits.  When patient was talked to calmly she began responding appropriately and interactive again.     Assessment and Plan:      Acute small bowel obstruction due to adhesions status post lysis and abdominal exploration.  N.p.o as per surgery  Okay with sips of water and medication.  Further management per general surgeon.    Hyperglycemia in the setting of insulin-dependent type  2 diabetes.    Briefly on insulin drip in the OR but now off the insulin drip.   As per chart review from care everywhere, patient is taking NovoLog 70-30 65 units twice daily.    -If patient continues to stay n.p.o. we will continue with Lantus 40 units and sliding scale every 6 hours.  Change when patient starts tolerating diet.      Presyncopal episode-possibly orthostatic, ?? Anxiety, unlikely seizures given that patient was communicating during the episode.  -We will continue with IV fluids  -Orthostats in the morning    Acute transient hypoxia.  Likely due to surgery.    -Currently on 2 L of nasal cannula    Lactic acidosis-IV hydration, trend levels    Seizure.  History of diffuse large B cell lymphoma diagnosed in 2008  Hypertension.  PTSD  History of schizoaffective disorder  History of status migrainosus  Lipitor,hydroxyzine, lisinopril, Metformin, risperidone, Topamax, and trazodone.    DVT PPx: Lovenox  Dispo: Inpatient  Code: full code     Subjective   Patient was seen and examined at bedside, rapid response was called this morning due to presyncopal episode when working with PT.  Patient hemodynamically stable.  After 1 to 2 minutes patient  was appropriately responding answering questions, alert and interactive.  Oxygen saturation and heart rate within normal limits.  Currently n.p.o. on fluids.        Objective   Physical Exam:     Vitals: T:97.5 F (36.4 C) (Temporal), BP:(!) 127/94, HR:87, RR:17, SaO2:94%    General: Patient is awake. In no acute distress.  HEENT: No conjunctival drainage, vision is intact, anicteric sclera.  Neck: Supple, no thyromegaly.  Chest: CTA bilaterally. No rhonchi, no wheezing. No use of accessory muscles.  CVS: Normal rate and regular rhythm no murmurs, without JVD, no pitting edema, pulses palpable.  Abdomen: Soft, minimally distended, dressing is clean dry and intact, no guarding or rigidity, with normal bowel sounds.  Mild tenderness on palpation on the right  lower quadrant.  Extremities: No calf swelling and no gross deformity.  Skin: Warm, dry, no rash and no worrisome lesions.  NEURO: No motor or sensory deficits.  Psychiatric: Alert, interactive, appropriate, normal affect.  Weight Monitoring 02/12/2020   Height 157.5 cm   Height Method Stated   Weight 72.576 kg   Weight Method Stated   BMI (calculated) 29.3 kg/m2         Intake/Output Summary (Last 24 hours) at 02/13/2020 1235  Last data filed at 02/13/2020 1030  Gross per 24 hour   Intake 2937.46 ml   Output 1060 ml   Net 1877.46 ml     Body mass index is 29.26 kg/m.     Meds:     Current Facility-Administered Medications   Medication Dose Route Frequency    albuterol-ipratropium  3 mL Nebulization Q6H SCH    enoxaparin  40 mg Subcutaneous Q24H    hydrOXYzine  25 mg Oral Daily    insulin glargine  40 Units Subcutaneous QHS    insulin lispro (1 Unit Dial)  2-24 Units Subcutaneous 4 times per day    lisinopril  20 mg Oral Daily    pantoprazole  40 mg Intravenous Q24H    risperiDONE  1 mg Oral Daily    sodium chloride (PF)  3 mL Intravenous Q12H SCH    topiramate  50 mg Oral Daily    traZODone  200 mg Oral QHS      sodium chloride 150 mL/hr at 02/13/20 0713    HYDROmorphone PF 0.2 mL/hr at 02/13/20 0553     PRN Meds: sodium chloride, dextrose, diphenhydrAMINE, glucagon (rDNA), naloxone, naloxone, ondansetron.     LABS:     Estimated Creatinine Clearance: 89.9 mL/min (based on SCr of 0.66 mg/dL).  Recent Labs   Lab 02/13/20  0433 02/12/20  0634   WBC 10.8 12.2*   RBC 5.07* 5.65*   Hemoglobin 15.4 17.3*   Hematocrit 47.6 52.9*   MCV 94 94   PLT CT 199 235             Lab Results   Component Value Date    HGBA1CPERCNT >14.0 02/12/2020     Recent Labs   Lab 02/13/20  0433 02/12/20  1453 02/12/20  0634 02/12/20  0634   Glucose 205* 310*  More results in Results Review 518*   Sodium 134* 136  More results in Results Review 130*   Potassium 4.2 4.7  More results in Results Review 4.7   Chloride 105 101  More  results in Results Review 93*   CO2 20.3 24.5  More results in Results Review 18.3*   BUN 15 16  More results in Results Review 16  Creatinine 0.66 0.78  More results in Results Review 1.05   EGFR 100 86  --  60   Calcium 9.0 9.3  More results in Results Review 10.0   More results in Results Review = values in this interval not displayed.     Recent Labs   Lab 02/12/20  1453 02/12/20  0634   Albumin 3.2* 4.0   Protein, Total 6.2 7.2   Bilirubin, Total 1.6* 0.8   Alkaline Phosphatase 130 140   ALT 33 11   AST (SGOT) 167* 13     Recent Labs   Lab 02/12/20  0745   Urine Specific Gravity 1.020   pH, Urine 5.5   Protein, UR Negative   Glucose, UA >=1000*   Ketones UA 40*   Bilirubin, UA Negative   Blood, UA Negative   Nitrite, UA Negative   Urobilinogen, UA 0.2   Leukocyte Esterase, UA Negative      Patient Lines/Drains/Airways Status     Active PICC Line / CVC Line / PIV Line / Drain / Airway / Intraosseous Line / Epidural Line / ART Line / Line / Wound / Pressure Ulcer / NG/OG Tube     Name Placement date Placement time Site Days    Peripheral IV 02/12/20 20 G Left Antecubital 02/12/20  0630  Antecubital  1    Peripheral IV 02/12/20 20 G Right Hand 02/12/20  1517  Hand  less than 1    Wound 02/12/20 Surgical Incision Abdomen Other (Comment) 02/12/20  1546  Abdomen  less than 1               CT Abdomen Pelvis with IV Cont    Result Date: 02/12/2020  Distal small bowel obstruction with transition point in the upper right anterior pelvis, presumably secondary to postsurgical adhesions. Mild small bowel dilatation measuring up to 3.6 cm. Small volume of free fluid in the pelvis. ReadingStation:[id number]     Home Health Needs:  There are no questions and answers to display.       Nutrition assessment done in collaboration with Registered Dietitians:     Time spent:      Alwyn Ren, MD     02/13/20,12:35 PM   MRN: [id number]                                      CSN: [id number] DOB: 12/30/1965

## 2020-02-13 NOTE — Plan of Care (Addendum)
NURSE NOTE SUMMARY  [facility] - MEDICAL SURGICAL UNIT   Patient Name: Tracy Bullock, Tracy Bullock   Attending Physician: Alwyn Ren, MD   Today's date:   02/13/2020 LOS: 1 days   Shift Summary:                                                              0700-Report received from Raynelle Fanning, [place]. Pt care assumed. Pt A/O. c/o of abdominal pain from surgery, continuous on PCA pump, settings verified with nightshift nurse, Raynelle Fanning, RN. On 2L NC. SCDs in place. Pt continuous NPO except meds with sips of water.   0940-PT/OT in room with pt. PT calling for help. Pt sitting up in chair, unresponsive. Rapid response team called. This RN and MD Vichare in room. Vital signs taken. Blood glucose 153. CBC, BMP, Lactic, MG drawn by lab. Pt back to baseline. Placed back into bed. Resting, with no complaints. This RN called Dr. Laurell Roof for update.    1030-Foley catheter removed. Purewick placed.   1253-blood glucose 139.   1603-Critical lab, Lactic acid 2.3. Notified MD Fredderick Severance. Orders to give 500 mL bolus.   1800-Pt was bladder scanned at 1600, showing 47 mL of urine retaining, hard to access accuracy due to pt's abdominal wound dressing. Pt has had no urine output since removal of foley catheter at 1030. Pt has had fluids running continuously throughout day. MD Vichare notified and at bedside. MD in room and notified pt of putting a foley catheter back in due to low urine output and urinary retention. Pt started to cry, became anxious, and refused to have foley or straight cath inserted. Pt able to void 100 ml of urine through purewick. MD Fredderick Severance also notified pt having soft BP (86/52 at 1712, 96/71 1736). Orders to give 1L bolus of LR. Will continue to monitor strict I/Os and will pass on to oncoming RN.   1756-blood glucose 113  1833-MD Pulizzi updated and made aware of urine output.   1900-Report given to Fleet Contras, Charity fundraiser. PCA pump settings verified with Fleet Contras, Charity fundraiser.    Provider Notifications:        Rapid Response  Notifications:  Mobility:      PMP Activity: Step 4 - Dangle at Bedside (02/13/2020  8:00 AM)     Weight tracking:  Family Dynamic:   Last 3 Weights for the past 72 hrs (Last 3 readings):   Weight   02/12/20 0622 72.6 kg (160 lb)             Last Bowel Movement   No data recorded        Problem: Moderate/High Fall Risk Score >5  Goal: Patient will remain free of falls  Outcome: Progressing  Flowsheets (Taken 02/13/2020 0800)  VH High Risk (Greater than 13):   ALL REQUIRED LOW INTERVENTIONS   ALL REQUIRED MODERATE INTERVENTIONS   RED "HIGH FALL RISK" SIGNAGE   BED ALARM WILL BE ACTIVATED WHEN THE PATEINT IS IN BED WITH SIGNAGE "RESET BED ALARM"   PATIENT IS TO BE SUPERVISED FOR ALL TOILETING ACTIVITIES   A safety companion may be used when deemed appropriate by the Primary RN and Clinical Administrator   Keep door open for better visibility   Request PT/OT therapy consult order from physician for patients with  gait/mobility impairment   Use assistive devices     Problem: Altered GI Function  Goal: Fluid and electrolyte balance are achieved/maintained  Outcome: Progressing  Flowsheets (Taken 02/13/2020 1058)  Fluid and electrolyte balance are achieved/maintained:   Monitor intake and output every shift   Monitor/assess lab values and report abnormal values   Provide adequate hydration   Assess and reassess fluid and electrolyte status   Observe for seizure activity and initiate seizure precautions if indicated   Observe for cardiac arrhythmias   Monitor for muscle weakness  Goal: Elimination patterns are normal or improving  Outcome: Progressing  Flowsheets (Taken 02/13/2020 1058)  Elimination patterns are normal or improving:   Report abnormal assessment to physician   Anticipate/assist with toileting needs   Assess for normal bowel sounds   Monitor for abdominal distension   Monitor for abdominal discomfort   Assess for signs and symptoms of bleeding.  Report signs of bleeding to physician    Administer treatments as ordered   Reinforce education on foods that improve and complicate bowel movements and how activity and medications can affect bowel movements   Encourage /perform oral hygiene as appropriate  Goal: Nutritional intake is adequate  Outcome: Progressing  Flowsheets (Taken 02/13/2020 1058)  Nutritional intake is adequate:   Encourage/perform oral hygiene as appropriate   Include patient/patient care companion in decisions related to nutrition   Assess anorexia, appetite, and amount of meal/food tolerated  Note: NPO except sips of water with medication   Goal: Mobility/Activity is maintained at optimal level for patient  Outcome: Progressing  Flowsheets (Taken 02/13/2020 1058)  Mobility/activity is maintained at optimal level for patient:   Increase mobility as tolerated/progressive mobility   Encourage independent activity per ability   Maintain proper body alignment   Plan activities to conserve energy, plan rest periods   Reposition patient every 2 hours and as needed unless able to reposition self   Assess for changes in respiratory status, level of consciousness and/or development of fatigue   Consult/collaborate with Physical Therapy and/or Occupational Therapy  Goal: No bleeding  Outcome: Progressing  Flowsheets (Taken 02/13/2020 1058)  No bleeding:   Monitor and assess vitals and hemodynamic parameters   Monitor/assess lab values and report abnormal values   Assess for bruising/petechia

## 2020-02-13 NOTE — Plan of Care (Addendum)
NURSE NOTE SUMMARY  [facility] - MEDICAL SURGICAL UNIT   Patient Name: Tracy Bullock, Tracy Bullock   Attending Physician: Alwyn Ren, MD   Today's date:   02/13/2020 LOS: 1 days   Shift Summary:                                                                Pt on PCA pump, settings verified with off-going RN Jearld Lesch.   Pt weaned off supplemental O2 onto room air. SPO2 >92%.   Pt assisted to chair, x2 assist, per request. Pt complaint of mild dizziness with ambulation, this is a chronic issue per Pt.   Home medications verified and med rec revised in chart according to current home medications, Dr. Audley Hose notified.   SCDs in place. Foley in place.   Pt requesting crackers to eat, is tearful when explained that she is NPO. Post-op education and safety reinforced.   High falls risk precautions in place.      Provider Notifications:     450cc urine output overnight. Dr. Audley Hose notified, orders received for 500cc NS bolus and inc of IVF to 150cc/hr NS.      Rapid Response Notifications:  Mobility:      PMP Activity: Step 5 - Chair (02/12/2020  8:30 PM)     Weight tracking:  Family Dynamic:   Last 3 Weights for the past 72 hrs (Last 3 readings):   Weight   02/12/20 0622 72.6 kg (160 lb)    No special circumstances.         Last Bowel Movement   No data recorded        Problem: Altered GI Function  Goal: Nutritional intake is adequate  Outcome: Not Progressing  Note: Pt remains NPO      Problem: Moderate/High Fall Risk Score >5  Goal: Patient will remain free of falls  Outcome: Progressing  Flowsheets (Taken 02/12/2020 1928)  VH High Risk (Greater than 13):   ALL REQUIRED LOW INTERVENTIONS   ALL REQUIRED MODERATE INTERVENTIONS   RED "HIGH FALL RISK" SIGNAGE   BED ALARM WILL BE ACTIVATED WHEN THE PATEINT IS IN BED WITH SIGNAGE "RESET BED ALARM"   A CHAIR PAD ALARM WILL BE USED WHEN PATIENT IS UP SITTING IN A CHAIR   PATIENT IS TO BE SUPERVISED FOR ALL TOILETING ACTIVITIES   Keep door open for better  visibility   Use of "STOP ask for help" sign     Problem: Altered GI Function  Goal: Fluid and electrolyte balance are achieved/maintained  Outcome: Progressing  Flowsheets (Taken 02/13/2020 0010)  Fluid and electrolyte balance are achieved/maintained:   Monitor intake and output every shift   Assess for confusion/personality changes   Observe for seizure activity and initiate seizure precautions if indicated   Observe for cardiac arrhythmias   Monitor for muscle weakness   Assess and reassess fluid and electrolyte status   Provide adequate hydration   Monitor/assess lab values and report abnormal values  Goal: Elimination patterns are normal or improving  Outcome: Progressing  Flowsheets (Taken 02/13/2020 0010)  Elimination patterns are normal or improving:   Report abnormal assessment to physician   Assess for normal bowel sounds   Monitor for abdominal discomfort   Anticipate/assist with toileting needs  Monitor for abdominal distension   Assess for signs and symptoms of bleeding.  Report signs of bleeding to physician   Encourage /perform oral hygiene as appropriate  Goal: Mobility/Activity is maintained at optimal level for patient  Outcome: Progressing  Flowsheets (Taken 02/13/2020 0010)  Mobility/activity is maintained at optimal level for patient:   Increase mobility as tolerated/progressive mobility   Plan activities to conserve energy, plan rest periods   Assess for changes in respiratory status, level of consciousness and/or development of fatigue   Encourage independent activity per ability   Reposition patient every 2 hours and as needed unless able to reposition self   Maintain proper body alignment   Perform active/passive ROM  Goal: No bleeding  Outcome: Progressing  Flowsheets (Taken 02/13/2020 0010)  No bleeding:   Monitor and assess vitals and hemodynamic parameters   Monitor/assess lab values and report abnormal values   Assess for bruising/petechia

## 2020-02-13 NOTE — Progress Notes (Signed)
PROGRESS  NOTE   GENERAL  SURGERY     Date Time:   02/13/20     7:15 AM    Patient Name:  Tracy Bullock, Tracy Bullock    Assessment:       --   SBO     S/p exploration w/ LOA on 02/12/20   POD # 1      --   Multiple medical problems    Appreciate hospitalist       Plan:       --   Supportive tx     --   NPO w/ sips for meds      --   IV hydration     --   PCA     --   Pulm tx     --   D/c Foley     --   DVT prophylaxis -- w/ PAS boots and Lovenox.              Subjective:       Presented w/ SBO.  Exploration on 02/12/20 w/ LOA.      Reasonably comfortable.      PCA for analgesia.      Will remove Foley today.     Labs reviewed.          Physical Exam:       Vitals:    02/13/20 0632   BP: 114/69   Pulse: 88   Resp: 17   Temp: 99 F (37.2 C)   SpO2: 96%       Intake and Output Summary (Last 24 hours) at Date Time    Intake/Output Summary (Last 24 hours) at 02/13/2020 0715  Last data filed at 02/13/2020 0600  Gross per 24 hour   Intake 3115.47 ml   Output 885 ml   Net 2230.47 ml           GENERAL:  Awake and cooperative.  Fair memory and judgement.    NEURO:  Moves all 4 extremities.      HEENT:  Sclera is white.  Conjunctiva pink.        ABDOMEN:  Soft.  Less distended.  Dressings are clean.      EXTREMITIES:   PAS boots in place.          Medications:       Current Facility-Administered Medications   Medication Dose Route Frequency    albuterol-ipratropium  3 mL Nebulization Q6H SCH    enoxaparin  40 mg Subcutaneous Q24H    hydrOXYzine  25 mg Oral Daily    insulin glargine  40 Units Subcutaneous QHS    insulin lispro (1 Unit Dial)  2-24 Units Subcutaneous 4 times per day    lisinopril  20 mg Oral Daily    pantoprazole  40 mg Intravenous Q24H    risperiDONE  1 mg Oral Daily    sodium chloride (PF)  3 mL Intravenous Q12H SCH    topiramate  50 mg Oral Daily    traZODone  200 mg Oral QHS         - - - - - - - - - - - - - - - - - - - - - -         CHEM:     Recent Labs   Lab 02/13/20  0433 02/12/20  1453  02/12/20  0634   Glucose 205* 310* 518*   Sodium 134* 136 130*   Potassium 4.2 4.7 4.7  Chloride 105 101 93*   CO2 20.3 24.5 18.3*   BUN 15 16 16    Creatinine 0.66 0.78 1.05   Calcium 9.0 9.3 10.0         CBC:       Recent Labs   Lab 02/13/20  0433 02/12/20  0634   WBC 10.8 12.2*   Hemoglobin 15.4 17.3*   Hematocrit 47.6 52.9*   PLT CT 199 235   MCV 94 94       BANDS:          POCT:    Recent Labs   Lab 02/13/20  0618 02/12/20  2036 02/12/20  1749 02/12/20  1645 02/12/20  1536   Glucose POCT 188* 269* 277* 276* 315*       LFTs:      Recent Labs   Lab 02/12/20  1453 02/12/20  0634   ALT 33 11   AST (SGOT) 167* 13   Alkaline Phosphatase 130 140   Bilirubin, Total 1.6* 0.8   Albumin 3.2* 4.0       COAG:          Lactate:    Recent Labs   Lab 02/12/20  0634   Lactic Acid 2.3*           Radiology:  CT Abdomen Pelvis with IV Cont    Result Date: 02/12/2020  Distal small bowel obstruction with transition point in the upper right anterior pelvis, presumably secondary to postsurgical adhesions. Mild small bowel dilatation measuring up to 3.6 cm. Small volume of free fluid in the pelvis. ReadingStation:[id number]             Labs:     Results     Procedure Component Value Units Date/Time    Dextrose Stick Glucose [[id number]]  (Abnormal) Collected: 02/13/20 0618    Specimen: Blood Updated: 02/13/20 0637     Glucose POCT 188 mg/dL     Basic Metabolic Panel [[id number]]  (Abnormal) Collected: 02/13/20 0433    Specimen: Plasma Updated: 02/13/20 0548     Sodium 134 mMol/L      Potassium 4.2 mMol/L      Chloride 105 mMol/L      CO2 20.3 mMol/L      Calcium 9.0 mg/dL      Glucose 401 mg/dL      Creatinine 0.27 mg/dL      BUN 15 mg/dL      Anion Gap 25.3 mMol/L      BUN / Creatinine Ratio 22.7 Ratio      EGFR 100 mL/min/1.68m2      Osmolality Calculated 275 mOsm/kg     CBC and differential [[id number]]  (Abnormal) Collected: 02/13/20 0433    Specimen: Blood Updated: 02/13/20 0537     WBC 10.8 K/cmm      RBC 5.07 M/cmm      Hemoglobin  15.4 gm/dL      Hematocrit 25.9 %      MCV 94 fL      MCH 30 pg      MCHC 32 gm/dL      RDW 56.3 %      PLT CT 199 K/cmm      MPV 9.7 fL      Neutrophils % 66.9 %      Lymphocytes 20.3 %      Monocytes 11.8 %      Eosinophils % 0.1 %      Basophils % 0.9 %  Neutrophils Absolute 7.2 K/cmm      Lymphocytes Absolute 2.2 K/cmm      Monocytes Absolute 1.3 K/cmm      Eosinophils Absolute 0.0 K/cmm      Basophils Absolute 0.1 K/cmm     Hemoglobin A1C [[id number]] Collected: 02/12/20 0634    Specimen: Blood Updated: 02/13/20 0024     Hgb A1C, % >14.0 %     Dextrose Stick Glucose [[id number]]  (Abnormal) Collected: 02/12/20 2036    Specimen: Blood Updated: 02/12/20 2053     Glucose POCT 269 mg/dL     Dextrose Stick Glucose [[id number]]  (Abnormal) Collected: 02/12/20 1749    Specimen: Blood Updated: 02/12/20 1804     Glucose POCT 277 mg/dL     Dextrose Stick Glucose [[id number]]  (Abnormal) Collected: 02/12/20 1645    Specimen: Blood Updated: 02/12/20 1712     Glucose POCT 276 mg/dL     Dextrose Stick Glucose [[id number]]  (Abnormal) Collected: 02/12/20 1536    Specimen: Blood Updated: 02/12/20 1602     Glucose POCT 315 mg/dL     Comprehensive metabolic panel [[id number]]  (Abnormal) Collected: 02/12/20 1453    Specimen: Plasma Updated: 02/12/20 1519     Sodium 136 mMol/L      Potassium 4.7 mMol/L      Chloride 101 mMol/L      CO2 24.5 mMol/L      Calcium 9.3 mg/dL      Glucose 536 mg/dL      Creatinine 6.44 mg/dL      BUN 16 mg/dL      Protein, Total 6.2 gm/dL      Albumin 3.2 gm/dL      Alkaline Phosphatase 130 U/L      ALT 33 U/L      AST (SGOT) 167 U/L      Bilirubin, Total 1.6 mg/dL      Albumin/Globulin Ratio 1.07 Ratio      Anion Gap 15.2 mMol/L      BUN / Creatinine Ratio 20.5 Ratio      EGFR 86 mL/min/1.72m2      Osmolality Calculated 285 mOsm/kg      Globulin 3.0 gm/dL     Respiratory Specimen Xpert Xpress(R) SARS-CoV-2 Qualitative PCR [[id number]] Collected: 02/12/20 1208    Specimen: Nasopharyngeal Swab Updated:  02/12/20 1341     SARS CoV-2 by PCR Negative     Does patient have symptoms related to condition of interest? Y     Is the patient hospitalized because of this condition? Y     Is patient admitted to the intensive care unit? 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 Y     Is the patient pregnant? N    Narrative:      Specimen source - Nasopharyngeal Swab       B-type Natriuretic Peptide [[id number]] Collected: 02/12/20 1254    Specimen: Plasma Updated: 02/12/20 1332     B-Natriuretic Peptide 12.1 pg/mL     Dextrose Stick Glucose [[id number]]  (Abnormal) Collected: 02/12/20 1312    Specimen: Blood Updated: 02/12/20 1328     Glucose POCT 302 mg/dL     Urinalysis w Microscopic and Culture if Indicated [[id number]]  (Abnormal) Collected: 02/12/20 0745    Specimen: Urine, Random Updated: 02/12/20 1223     Color, UA Yellow     Clarity, UA Clear     Urine Specific Gravity  1.020     pH, Urine 5.5 pH      Protein, UR Negative mg/dL      Glucose, UA >=2130 mg/dL      Ketones UA 40 mg/dL      Bilirubin, UA Negative mg/dL      Blood, UA Negative mg/dL      Nitrite, UA Negative     Urobilinogen, UA 0.2 mg/dL      Leukocyte Esterase, UA Negative Leu/uL      UR Micro Not Indicated    Lactic Acid [[id number]]  (Abnormal) Collected: 02/12/20 0634    Specimen: Blood Updated: 02/12/20 0830     Lactic Acid 2.3 mMol/L     Comprehensive metabolic panel [[id number]]  (Abnormal) Collected: 02/12/20 0634    Specimen: Plasma Updated: 02/12/20 0744     Sodium 130 mMol/L      Potassium 4.7 mMol/L      Chloride 93 mMol/L      CO2 18.3 mMol/L      Calcium 10.0 mg/dL      Glucose 440 mg/dL      Creatinine 1.02 mg/dL      BUN 16 mg/dL      Protein, Total 7.2 gm/dL      Albumin 4.0 gm/dL      Alkaline Phosphatase 140 U/L      ALT 11 U/L      AST (SGOT) 13 U/L      Bilirubin, Total 0.8 mg/dL      Albumin/Globulin Ratio 1.25 Ratio      Anion Gap 23.4 mMol/L      BUN / Creatinine Ratio 15.2  Ratio      EGFR 60 mL/min/1.87m2      Osmolality Calculated 285 mOsm/kg      Globulin 3.2 gm/dL     Lipase [[id number]] Collected: 02/12/20 0634    Specimen: Plasma Updated: 02/12/20 0744     Lipase 13 U/L     Ketones, blood [[id number]]  (Abnormal) Collected: 02/12/20 0634    Specimen: Blood Updated: 02/12/20 0734     Ketone Small    CBC and differential [[id number]]  (Abnormal) Collected: 02/12/20 0634    Specimen: Blood Updated: 02/12/20 0724     WBC 12.2 K/cmm      RBC 5.65 M/cmm      Hemoglobin 17.3 gm/dL      Hematocrit 95.1 %      MCV 94 fL      MCH 31 pg      MCHC 33 gm/dL      RDW 88.4 %      PLT CT 235 K/cmm      MPV 8.4 fL      Neutrophils % 82.3 %      Lymphocytes 11.8 %      Monocytes 5.6 %      Eosinophils % 0.0 %      Basophils % 0.3 %      Neutrophils Absolute 10.0 K/cmm      Lymphocytes Absolute 1.4 K/cmm      Monocytes Absolute 0.7 K/cmm      Eosinophils Absolute 0.0 K/cmm      Basophils Absolute 0.0 K/cmm             Rads:     Radiological Procedure reviewed.    @RAD2DAY @          Signed by:   Dineen Kid, MD          Although significant efforts were  made to ensure accuracy of spelling and grammar, todays note was completed in large part by speech/voice recognition Hotel manager) software and by keyboarding information into the electronic health care record in real time.  As such, there may be errors in grammar and spelling, insertion of incorrect words/phrases, pronoun errors, etc., that should be disregarded when reviewing this note.    JP

## 2020-02-13 NOTE — UM Notes (Addendum)
Authorization Request for Inpatient and Observation Services    Go to [email or web address] for other preauthorization request forms  Authorization is not a guarantee of payment    Health Care Services-Hospital Review Team  Please only fill out this form for members who require authorization and are currently in the hospital receiving services.  PH: [phone] or toll free [phone] Review Team Fax: [phone] or [phone]       Out of area request xx Inpatient admission  Outpatient service  72 hour observation       Date of service: 02/12/2020       Member Name / Last, First Member ID / Policy # Date of Birth Todays Date   Tracy Bullock, Tracy Bullock   [id number] 26-Aug-1965   02/13/2020       Requesting Provider (Full Name of physician): ? Dr. Frederica Kuster:     [employer] ID or Tax or NPI #: ?  [id number] (gen. surgery)       Phone:  Fax:      The following information is required to process your request:        K56.609 - SBO (small bowel obstruction), K56.609 - Small bowel obstruction, Small bowel obstruction         Procedure Codes:  /  /  /      Hospital / Facility (Full Name): [facility]  [address]  [place]  [address].       Tax ID or  NPI: [id number]  [id number]     Person Completing this Form: Lowella Dell RN CCM/UR.      Phone: [phone] / ext.  Fax: [phone]     Please attach relevant clinical documentation to this request. You may call Provider Relations at [phone] or [phone] Option #2 to verify benefits, determine if preauthorization is required or to check the status of a previously submitted request. Please submit clinical information no later than 48 hours after admission to the facility.                [facility] Utilization Management Review Sheet    Facility :  [facility]    NAME: Tracy Bullock  MR#: [id number]    CSN#: [id number]    ROOM: 212/212-A AGE: 54 y.o.    ADMIT DATE AND TIME: 02/12/2020  6:17 AM      PATIENT CLASS:   Inpatient <> 02/12/2020 4:40 PM    ATTENDING PHYSICIAN: Alwyn Ren, MD  PAYOR:Payor: MEDICAID HMO / Plan: [employer]US / Product Type: MANAGED MEDICAID /       AUTH #:     DIAGNOSIS:     ICD-10-CM    1. SBO (small bowel obstruction)  K56.609        HISTORY:   Past Medical History:   Diagnosis Date    Back pain     CHF (congestive heart failure)     Convulsions     COPD (chronic obstructive pulmonary disease)     Coronary artery disease     Diabetes mellitus     Hypertension     Learning disability     MI (myocardial infarction)     OSA (obstructive sleep apnea)      Patient presented to the ED<> N/V, abdominal pain x2 days. Dry heaves today. IDDM, unable to take her insulin as well. No BM x2 days, +flatus    Exam<>Abdominal tenderness, RLQ, suprapubic area and LLQ.  Labs<> Wbc 12.2. H/H 12/53. Ketones small. Na 130. Glucose 518. LA 2.3.     CT abd<>   Impression:      Distal small bowel obstruction with transition point in the upper right anterior pelvis, presumably secondary to postsurgical adhesions. Mild small bowel dilatation measuring up to 3.6 cm. Small volume of free fluid in the pelvis.         ED tx<> ML. Dilaudid 1mg  IV. IV 2000cc bolus. Dilaudid 0.5mg  x1, 1mg  x2 IV. Benadryl 25mg  IV   Zofran 4mg  IV      H&P<> Dr Audley Hose    #Dx<> Acute SBO due to adhesions, s/p lysis and abd. Exploration;  Hyperglycemia in the setting of insulin-dependent type 2 diabetes.  Briefly on insulin drip but now off the insulin drip.   Pt reports pain is excruciating and unrelenting abd. Pain.   -  ML  NPO  Consult General surgery  Plan OR.  Monitor labs  Oxygen 2L per nc  IS  Consult PT/OT  I/O  Duoneb Q6H  Cpro 400mg  IV x1  Pepcid 20mg  IV Q12H  Flagyl 500mg  IV on call OR  Protonix 40mg  IV Q24H  IV NS 150cc hr.   Insulin gtt<> D/c @1750     .             Surgery consult<> Dr Laurell Roof    Plan:       --   Plan for OR today               Abdominal exploration -- possible SBO vs acute  abdomen         --   Admit to hospital, in-patient status.       --   Analgesics and antibiotics.      --   DVT prophylaxis with PAS boots and Lovenox.      --   Hospitalist consultation.      --   Physical therapy consultation      OR report<>  Operative Procedure:     --  Abdominal exploration     --  Lyses of adhesions     Preoperative Diagnosis:       Abdominal pain and abdominal exam concerning for high-grade small bowel obstruction.        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            CSR<> 02/13/2020<> Post op D#1.    Surgery<>    Npo  PCA  IV hydration  [place] foley  DVT prophylaxis      Hospitalist<>    Rapid response called this Am<> presyncope w/some confusion.   Remain Npo  Sips h20 w/meds  SSI/FSBS.   Weaned oxygen<>RA      Meets MCG<> -210  Lowella Dell RN,CCM Caralee Ates    [facility]  [address]  [place], [place] [address]    Ph: [phone]  Fx: [phone]

## 2020-02-13 NOTE — Plan of Care (Addendum)
NURSE NOTE SUMMARY  [facility] - MEDICAL SURGICAL UNIT   Patient Name: Tracy Bullock, Tracy Bullock   Attending Physician: Alwyn Ren, MD   Today's date:   02/13/2020 LOS: 1 days   Shift Summary:                                                              Abdominal dressing clean, dry, and intact. PCA infusing well. Patient on telemetry and continuous pulse ox. NSR and 92% on 2L of oxygen via nasal cannula. When listening to bowel sounds patient grimaces in pain. Tender in ABD. Fingerstick at bedtime was 91. Patient refused Lantus.   0200- brief was saturated with urine. Changed patient. New purewick applied. Patient's oxygen went down to low 80s. Increased patient to 5L oxygen via nasal cannula. Heard rhonchi in upper lobes. Patient sounded congested. Patient coughed and expectorated tan sputum. RT called. She heard diminished after her coughing. MD notified. RT administered breathing treatments.   0630-patient complains of her abdominal binder rubbing. Removed ABD binder for now.    Provider Notifications:        Rapid Response Notifications:  Mobility:      PMP Activity: Step 4 - Dangle at Bedside (02/13/2020 10:21 PM)     Weight tracking:  Family Dynamic:   Last 3 Weights for the past 72 hrs (Last 3 readings):   Weight   02/12/20 0622 72.6 kg (160 lb)             Last Bowel Movement   No data recorded        Problem: Moderate/High Fall Risk Score >5  Goal: Patient will remain free of falls  Outcome: Progressing  Flowsheets (Taken 02/13/2020 2221)  VH High Risk (Greater than 13):   ALL REQUIRED LOW INTERVENTIONS   ALL REQUIRED MODERATE INTERVENTIONS   RED "HIGH FALL RISK" SIGNAGE   BED ALARM WILL BE ACTIVATED WHEN THE PATEINT IS IN BED WITH SIGNAGE "RESET BED ALARM"     Problem: Altered GI Function  Goal: Fluid and electrolyte balance are achieved/maintained  Outcome: Progressing  Flowsheets (Taken 02/13/2020 2222)  Fluid and electrolyte balance are achieved/maintained:   Monitor intake  and output every shift   Monitor/assess lab values and report abnormal values   Provide adequate hydration   Assess for confusion/personality changes   Monitor daily weight   Assess and reassess fluid and electrolyte status   Observe for seizure activity and initiate seizure precautions if indicated   Observe for cardiac arrhythmias  Goal: Elimination patterns are normal or improving  Outcome: Progressing  Flowsheets (Taken 02/13/2020 2222)  Elimination patterns are normal or improving:   Report abnormal assessment to physician   Anticipate/assist with toileting needs   Assess for normal bowel sounds   Monitor for abdominal distension   Monitor for abdominal discomfort   Assess for signs and symptoms of bleeding.  Report signs of bleeding to physician   Administer treatments as ordered   Consult/collaborate with Clinical Nutritionist   Administer medications to improve bowel evacuation as prescribed  Goal: Nutritional intake is adequate  Outcome: Progressing  Goal: Mobility/Activity is maintained at optimal level for patient  Outcome: Progressing  Goal: No bleeding  Outcome: Progressing     Problem: Compromised Tissue integrity  Goal: Damaged  tissue is healing and protected  Outcome: Progressing  Flowsheets (Taken 02/13/2020 2222)  Damaged tissue is healing and protected:   Monitor/assess Braden scale every shift   Provide wound care per wound care algorithm   Reposition patient every 2 hours and as needed unless able to reposition self   Increase activity as tolerated/progressive mobility   Relieve pressure to bony prominences for patients at moderate and high risk   Avoid shearing injuries   Keep intact skin clean and dry   Use bath wipes, not soap and water, for daily bathing   Use incontinence wipes for cleaning urine, stool and caustic drainage. Foley care as needed   Monitor patient's hygiene practices  Goal: Nutritional status is improving  Outcome: Progressing

## 2020-02-13 NOTE — Progress Notes (Signed)
Electronic chart review    Readmission Risk  [facility] - MEDICAL SURGICAL UNIT   Patient Name: Tracy Bullock, Tracy Bullock   Attending Physician: Alwyn Ren, MD   Today's date:   02/13/2020 LOS: 1 days   Expected Discharge Date      Readmission Assessment:                                                              Discharge Planning  ReAdmit Risk Score: 11  CM Comments: Acute small bowel obstruction due to adhesions status post lysis and abdominal exploration..  NPO, IVF, pain control. PT/OT consult.       IDPA:   Patient Type  Within 30 Days of Previous Admission?: No (Admit with Acute small bowel obstruction due to adhesions s/p  lysis and abdominal exploration.  NPO, IVF, pain control.  Insured with [employer] mcaid)  Healthcare Decisions  Advance Directive: Patient does not have advance directive  Healthcare Agent Appointed: No  Prior to admission  Type of Residence: Private residence  Living Arrangements: Friends  Dressing: Independent  Grooming: Independent  Feeding: Independent  Bathing: Independent  Toileting: Independent  Discharge Planning  Support Systems: Friends/neighbors  Patient expects to be discharged to:: home  Consults/Providers  PT Evaluation Needed: No  OT Evalulation Needed: No  SLP Evaluation Needed: No  Correct PCP listed in Epic?: Yes (D Thacker.  Surgeon Dr Laurell Roof,   Neuro at [facility].)  Family and PCP  PCP on file was verified as the current PCP?: Yes   30 Day Readmission:       Provider Notifications:

## 2020-02-13 NOTE — OT Eval Note (Signed)
Thank you for allowing [place] to participate in the care of  Tracy Bullock.  Regulations from the [employer] for Medicare and Medicaid Services (CMS) require your review and approval for this Plan of Care.  Please co-sign this note indicating you are in agreement with the Occupational Therapy Plan of Care.                                              Patient: Tracy Bullock  Bed: 161/096-E   [facility]  CSN: [id number]   Occupational Therapy EVALUATION      Visit#: 1   Treatment Frequency: OT Frequency Recommended: 4-5x/wk    OT Assessment:   At baseline/prior to admission: Patient was functioning at Household ambulation  Mobility:  Independent with  No assistive device.    Activities of Daily living:  Patient was independent with all ADLs  Instrumental Activities of Daily Living:  Patient was dependent for all IADLs at baseline.  Fall history: Did not obtain    Currently: The patient's noted medical issues are resulting in impairments with decreased strength, balance deficits, decreased problem solving, decreased cognition, decreased activity tolerance, decreased safety awareness, fall risk, impulsivity and pain interfered with activity participation. These impairments are affecting the patient's ability to perform in needed/desired occupations resulting in performance deficits in bathing/showering, toileting & toilet hygiene, dressing, feeding, functional mobility/transfers and personal hygiene & grooming. Would benefit from continued occupational therapy services for above noted impairments.     DISCHARGE Recommendations for post acute OT:   Pending medical progress  Pending progress in next therapy session  Home with 24/7 support    DME recommended for Discharge:   TBD closer to discharge    OT Recommendations during Hospital stay:  Recommend client  continue participation in ADL tasks sitting EOB and/or standing as tolerated,  transfers to [facility] for toileting with assist using AD outside of  and in addition to OT session    Precautions and Contraindications:   Seizure precautions  Falls  NPO   History of psychiatric problems including PTSD and schizoaffective disorder    HPI summary (per physician charting) and Pertinent Medical Details:  "Tracy Bullock a 54 y.o.femalepatient who presented to ED with increasing abdominal pain associated with nausea vomiting for 3 days. Patient has multiple prior abdominal surgeries. Nonbloody emesis and no diarrhea. Patient denied any fever, chill, chest pain, shortness of breath or sick contact. In the ED, there was evidence of small of obstruction and adhesion. General surgeon was consulted who took the patient to the OR for lysis of adhesion and exploration. Hospitalist was called for medical management. Patient was placed briefly on insulin drip for hyperglycemia in the OR and insulin drip was stopped in PACU.  Patient is still n.p.o. as per general surgery recommendation except meds with small sips of water." Per Dr. Vanessa Durham H&P    "Rapid response was called today in the morning for a brief episode of presyncope.  Possibly orthostatic, patient continued on IV fluids, orthostats every morning.  Patient has multiple psychiatric problems including PTSD and schizoaffective disorder.  Her presyncope with some confusion is possibly also contributed by her anxiety.  Patient was hemodynamically stable during the rapid response.  Blood sugar was within normal limits.  When patient was talked to calmly she began responding appropriately and interactive again." Copied from Dr.  Vichare's H&P.      RRT was called during co-evaluation with PT/OT present. Patient was walking to BR for toileting she became weak/limp and would not respond to therapist's. This therapist grabbed a chair to place behind pt and she was assisted to chair immediately. Once seated in chair, chair was moved back next to bed and pt was unresponsive. RRT was initiated, cold washcloth was  placed on patient and after a few minutes of unresponsiveness she came to and began talking with staff. Pt noted some confusion about what had happened but was able to recall that she was "stripping in bed before getting up to walk to BR as she had to place abdominal binder on". She was assisted back into bed safely and settled with needs in reach. Prior to walking, pt was very anxious about medical cords, pain in stomach, and her general progress. Feel history of psychiatric problems contributed to presyncope episode this morning.      Goals:  STGs:  1. Pt will transfer to BSC/chair with LRAD at [facility] Assist level. NEW  2. Pt will complete UB bathing/dressing with set up assist only. NEW  3. Pt will complete LB dressing/bathing with Min Assist and AD prn. NEW  4. Pt will performing toileting tasks at Min A level with AD prn. NEW  5. Pt will be independent with bed mobility. NEW     **goals set based on d/c to home and to be updated based on patient progression    -------------------------------------------------------------------------------------------------------------------------------    Patient presenting with the following OT impairments:  decreased strength, decreased coordination, balance deficits, decreased independence with ADLs, decreased activity tolerance, decrease safety awareness, decreased problem solving, decreased functional mobility, decreased functional transfers    Treatment/Interventions:  ADL retraining, functional transfer training, cognition (safety awareness, problem solving, etc.), patient/family training, equipment eval/education    Patient will benefit from skilled OT services in order to to address the above listed impairments to increase safety and independence with ADLs, IADLs, transfers and mobility in preparation for d/c.    Upon review of the occupational profile and client history, the patient's history was found to be EXPANDED.  During assessment of the occupational performance,  the patient demonstrated total of 5 or more performance deficits with minimal to moderate modificationsof task or assistance during assessment.  The patient has multiple treatment options in order to improve patient's level of function.  After review of the above, patient's degree of complexity is MODERATE.    Rehabilitation Potential:  Fair, pending medical progress. Patient has multiple medical complications 24 hour supervision recommended    Discussed risk, benefits and Plan of Care with: Patient    History Based on physician charted EPIC/EMR information:   Medical Diagnosis: SBO (small bowel obstruction) [K56.609]  Small bowel obstruction [K56.609]    Problem list:  Patient Active Problem List   Diagnosis    SBO (small bowel obstruction)        Past Medical/Surgical History:  Past Medical History:   Diagnosis Date    Back pain     CHF (congestive heart failure)     Convulsions     COPD (chronic obstructive pulmonary disease)     Coronary artery disease     Diabetes mellitus     Hypertension     Learning disability     MI (myocardial infarction)     OSA (obstructive sleep apnea)       Past Surgical History:   Procedure Laterality Date  APPENDECTOMY      Open appendectomy.      CATARACT EXTRACTION      CESAREAN SECTION      C-sections x 3.      CHOLECYSTECTOMY      Open cholecystectomy.      EXPLORATORY LAPAROTOMY N/A 02/12/2020    Procedure: EXPLORATORY LAPAROTOMY;  Surgeon: Dineen Kid, MD;  Location: [facility] MAIN OR;  Service: General;  Laterality: N/A;    HYSTERECTOMY      TAH-BSO.           Social History    Information per Patient, Chart review:    Home Living Arrangements:  Living Arrangements: Roommate, Mellody Dance  Assistance Available: Part time, Mellody Dance works during the day  Type of Home: Apartment  Home Layout: One level, with 0 stair(s) to enter, Able to live on main level with bedroom and bathroom  Bathroom:  standard toilet;  tub/shower unit    Work  Unemployed    DME available  at home:  None   CPAP machine in [place]    Subjective   "I'm afraid"  Patient/family/caregiver consent to therapy session is noted by the participation in the therapy session.    Patient/caregiver goal for OT: Return home with roommate    Pain:  Numeric Scale         At Rest: 5 /10       With Activity: 8/10    Objective:    Patient's medical condition is appropriate for Occupational Therapy intervention at this time    Observation of patient:  Patient is in bed with dressings, Bed/chair alarm on, telemetry, continuous pulse oximeter, SCD's, peripheral IV, O2 at 2 liters/minute via nasal cannula, indwelling urinary catheter     Cognition:  Oriented to: Person, Place  and Situation  Command following: Follows multi-step commands with increased time, Follows multi-step commands with repetition  Alertness/Arousal: Appropriate responses to stimuli, Inconsistent responses to stimuli   Attention Span:Difficulty attending to directions, Difficulty dividing attention  Memory: UTA=Unable to assess  Safety Awareness: minimal verbal instruction  Insights: Not aware of deficits, Educated in safety awareness  Problem Solving: Assistance required to identify errors made, Assistance required to generate solutions, Assistance required to implement solutions, maximum assistance    Vital Signs (Cardiovascular):  Supine: HR 104 bpm; SpO2 at rest:  95% on 2L  After exercise/seated: O2 at 92% on RA, pt experienced brief episode of presyncope but O2 remained in 90s on RA due to being out of reach of oxygen and pt performing walk to see if O2 was needed    Balance:  Static Sitting:  WFL  Dynamic Sitting: Fair+  Static Standing:  Fair  Dynamic Standing:  Poor+    Musculoskeletal Examination:   Edema: none observed  Skin Inspection: intact for areas observed     Range of motion:  Bilateral UE: Grossly WFL       Strength:  Bilateral UE: Not tested    Sensory/Oculomotor Examination:    Auditory:  WFL=intact  Visual Acuity:   WFL=intact      Activities of Daily Living:  Eating:  Set up assist, simulated, due to pt N.P.O  Grooming: Set up assist, wash face  UB dressing:  Mod Assist, donn/doff hospital gown, sitting in bed, EOB  LB dressing: Max Assist, donn socks, seated EOB  Bathing:  Not Tested  Toileting: Not tested due to pt feeling weak and becoming unresponsive while walking to BR and requiring assistance to chair to  sit     Functional Mobility:  Bed Mobility:  Rolling to Left:  Minimal assist.   Cues for Log rolling., Cues for Hand placement., HOB elevated, Bed rail used  Supine to Sit:   Minimal assist.   Cues for Log rolling., Cues for Hand placement., HOB elevated, Bed rail used, assist at trunk  Sit to Supine:   Moderate assist.   Cues for Log rolling., Cues for Hand placement., HOB elevated, Bed rail used, assist at trunk, assist at LE(s)  Seated Scooting:   Supervision    Transfers:  Sit to Stand:  Minimal assist (assist x2 for safety) with No assistive device, Hand held assist.   Cues for Hand Placement  Stand to Sit:  Moderate assist (assist x 2 for safety).   Cues for Hand Placement  Functional mobility/ambulation: Moderate assist (assist x2 for safety) with HHA, pt experienced near syncope episode then required Max Assist x2 and required a chair pushed behind her to sit due to becoming unresponsive    Participation/Activity Tolerance:  Participation effort: Fair  Activity Tolerance: Tolerates 10 minutes of activity with multiple rests    Treatment Interventions this session:   OT Eval    Education Provided:   TOPICS: Role of occupational therapy, plan of care, goals of therapy and safety with mobility and ADLs, benefits of activity, energy conservation techniques, pursed lip breathing, breathing strategies, abdominal precautions, home safety, use of adaptive equipment, bed mobility with use of adaptive equipment or strategy, activity with nursing     Learner educated: Patient  Method: Explanation and  Demonstration  Response to education: Verbalized understanding and Needs reinforcement    Patient Position at End of Treatment:   Supine, in bed, Staff present: RN Davonna Belling, PCA, Needs in reach, Bed/chair alarm set, No distress and SCD's/foot pumps applied    Team Communication:   Spoke to: RN/LPN - Maragrita, Estella Husk, PCAs, Physician/NP/PA -  Dr. Fredderick Severance, PT - Lupita Leash (co-eval), Rehab aide: Wendall Mola  Regarding: Pre-session re: patient status, Patient position at end of session, Discharge needs, Patient participation with Therapy, Vital signs  Whiteboard updated: Yes  OT/COTA communication: via written note and verbal communication as needed.    Time of treatment:  Time Calculation  OT Received On: 02/13/20  Start Time: 0915  Stop Time: 1005  Time Calculation (min): 50 min    Meta Hatchet, OTR/L

## 2020-02-14 LAB — CBC AND DIFFERENTIAL
Basophils %: 0.9 % (ref 0.0–3.0)
Basophils Absolute: 0.1 10*3/uL (ref 0.0–0.3)
Eosinophils %: 0.2 % (ref 0.0–7.0)
Eosinophils Absolute: 0 10*3/uL (ref 0.0–0.8)
Hematocrit: 38.6 % (ref 36.0–48.0)
Hemoglobin: 12.4 gm/dL (ref 12.0–16.0)
Lymphocytes Absolute: 1.6 10*3/uL (ref 0.6–5.1)
Lymphocytes: 18.1 % (ref 15.0–46.0)
MCH: 31 pg (ref 28–35)
MCHC: 32 gm/dL (ref 31–36)
MCV: 96 fL (ref 80–100)
MPV: 9.3 fL (ref 6.0–10.0)
Monocytes Absolute: 0.7 10*3/uL (ref 0.1–1.7)
Monocytes: 8.3 % (ref 3.0–15.0)
Neutrophils %: 72.5 % (ref 42.0–78.0)
Neutrophils Absolute: 6.3 10*3/uL (ref 1.7–8.6)
PLT CT: 154 10*3/uL (ref 130–440)
RBC: 4.01 10*6/uL (ref 3.80–5.00)
RDW: 12.2 % (ref 10.5–14.5)
WBC: 8.7 10*3/uL (ref 4.0–11.0)

## 2020-02-14 LAB — BASIC METABOLIC PANEL
Anion Gap: 11.8 mMol/L (ref 7.0–18.0)
BUN / Creatinine Ratio: 15.8 Ratio (ref 10.0–30.0)
BUN: 9 mg/dL (ref 7–22)
CO2: 18.6 mMol/L — ABNORMAL LOW (ref 20.0–30.0)
Calcium: 8.3 mg/dL — ABNORMAL LOW (ref 8.5–10.5)
Chloride: 110 mMol/L (ref 98–110)
Creatinine: 0.57 mg/dL — ABNORMAL LOW (ref 0.60–1.20)
EGFR: 105 mL/min/{1.73_m2} (ref 60–150)
Glucose: 86 mg/dL (ref 71–99)
Osmolality Calculated: 272 mOsm/kg — ABNORMAL LOW (ref 275–300)
Potassium: 3.4 mMol/L — ABNORMAL LOW (ref 3.5–5.3)
Sodium: 137 mMol/L (ref 136–147)

## 2020-02-14 LAB — VH DEXTROSE STICK GLUCOSE
Glucose POCT: 110 mg/dL — ABNORMAL HIGH (ref 71–99)
Glucose POCT: 125 mg/dL — ABNORMAL HIGH (ref 71–99)
Glucose POCT: 146 mg/dL — ABNORMAL HIGH (ref 71–99)
Glucose POCT: 83 mg/dL (ref 71–99)
Glucose POCT: 87 mg/dL (ref 71–99)

## 2020-02-14 LAB — MAGNESIUM: Magnesium: 1.5 mg/dL — ABNORMAL LOW (ref 1.6–2.6)

## 2020-02-14 LAB — PHOSPHORUS: Phosphorus: 1.9 mg/dL — ABNORMAL LOW (ref 2.3–4.7)

## 2020-02-14 MED ORDER — ACETAMINOPHEN 325 MG PO TABS
650.0000 mg | ORAL_TABLET | ORAL | Status: DC | PRN
Start: 2020-02-14 — End: 2020-02-17
  Administered 2020-02-14: 650 mg via ORAL
  Filled 2020-02-14 (×2): qty 2

## 2020-02-14 MED ORDER — ALBUTEROL-IPRATROPIUM 2.5-0.5 (3) MG/3ML IN SOLN
3.0000 mL | Freq: Four times a day (QID) | RESPIRATORY_TRACT | Status: AC
Start: 2020-02-14 — End: 2020-02-14
  Administered 2020-02-14 (×3): 3 mL via RESPIRATORY_TRACT
  Filled 2020-02-14 (×2): qty 3

## 2020-02-14 MED ORDER — SODIUM CHLORIDE 0.9 % IV SOLN
30.0000 mmol | Freq: Once | INTRAVENOUS | Status: AC
Start: 2020-02-14 — End: 2020-02-14
  Administered 2020-02-14: 11:00:00 30 mmol via INTRAVENOUS
  Filled 2020-02-14: qty 10

## 2020-02-14 MED ORDER — INSULIN LISPRO (1 UNIT DIAL) 100 UNIT/ML SC SOPN
2.0000 [IU] | PEN_INJECTOR | Freq: Four times a day (QID) | SUBCUTANEOUS | Status: DC
Start: 2020-02-14 — End: 2020-02-17
  Administered 2020-02-15 – 2020-02-16 (×5): 2 [IU] via SUBCUTANEOUS

## 2020-02-14 MED ORDER — MAGNESIUM SULFATE IN D5W 1-5 GM/100ML-% IV SOLN
1.0000 g | INTRAVENOUS | Status: AC
Start: 2020-02-14 — End: 2020-02-14
  Administered 2020-02-14 (×2): 1 g via INTRAVENOUS
  Filled 2020-02-14 (×2): qty 100

## 2020-02-14 NOTE — Plan of Care (Addendum)
NURSE NOTE SUMMARY  [facility] - MEDICAL SURGICAL UNIT   Patient Name: Tracy Bullock, Tracy Bullock   Attending Physician: Alwyn Ren, MD   Today's date:   02/15/2020 LOS: 3 days   Shift Summary:                                                              0700 - Assumed care of patient. Patient emotionally labile, tearful, drowsy, low attention. PCA pump running. Oriented. Bed alarm on, call bell within reach.  0800 - Patient up to chair. Worked with OT and PT, did well.  1000 - PCA d/c'ed. Wasted 6.45mL remaining in cartridge in the Pyxis with Levon Hedger.  1100 - Foley inserted, minimal discomfort per patient. Clear liquid diet ordered per provider. Patient refusing to eat clear liquid acceptable food items. Education given.  1500 - Per MD, full diet OK to order. Education given to patient regarding potential stomach pain, discomfort after eating. Patient verbalized understanding.  1730 - Patient ate dinner, complained of stomach pain. Assisted patient back to bed to rest.   1900 - Report given to oncoming RN. Reviewed patient assessment, plan of care, discharge planning. Patient verbalized understanding of change of shift. No further questions from patient or RN. Patient in bed, denies pain, bed alarm on, call bell within reach.     Provider Notifications:        Rapid Response Notifications:  Mobility:      PMP Activity: Step 5 - Chair (02/14/2020  7:20 PM)     Weight tracking:  Family Dynamic:   Last 3 Weights for the past 72 hrs (Last 3 readings):   Weight   02/14/20 0340 85.1 kg (187 lb 9.8 oz)             Last Bowel Movement   No data recorded              Problem: Moderate/High Fall Risk Score >5  Goal: Patient will remain free of falls  Outcome: Progressing  Flowsheets (Taken 02/14/2020 0800)  VH High Risk (Greater than 13):   ALL REQUIRED LOW INTERVENTIONS   RED "HIGH FALL RISK" SIGNAGE   ALL REQUIRED MODERATE INTERVENTIONS   BED ALARM WILL BE ACTIVATED WHEN THE PATEINT IS IN BED WITH  SIGNAGE "RESET BED ALARM"   A CHAIR PAD ALARM WILL BE USED WHEN PATIENT IS UP SITTING IN A CHAIR   PATIENT IS TO BE SUPERVISED FOR ALL TOILETING ACTIVITIES   A safety companion may be used when deemed appropriate by the Primary RN and Clinical Administrator   Keep door open for better visibility   Include family/significant other in multidisciplinary discussion regarding plan of care as appropriate   Use assistive devices   Request PT/OT therapy consult order from physician for patients with gait/mobility impairment  Note: Call bell within reach, bed alarm on, chair alarm on     Problem: Compromised Tissue integrity  Goal: Damaged tissue is healing and protected  Outcome: Progressing  Flowsheets (Taken 02/13/2020 2222 by Horace Porteous, RN)  Damaged tissue is healing and protected:   Monitor/assess Braden scale every shift   Provide wound care per wound care algorithm   Reposition patient every 2 hours and as needed unless able to reposition self  Increase activity as tolerated/progressive mobility   Relieve pressure to bony prominences for patients at moderate and high risk   Avoid shearing injuries   Keep intact skin clean and dry   Use bath wipes, not soap and water, for daily bathing   Use incontinence wipes for cleaning urine, stool and caustic drainage. Foley care as needed   Monitor patient's hygiene practices  Note: Redressed, cleaned incision site

## 2020-02-14 NOTE — OT Progress Note (Signed)
Patient: Tracy Bullock  Bed: 366/440-H   [facility]  CSN: [id number]   Occupational Therapy PROGRESS NOTE      Visit#: 2  Treatment Frequency: OT Frequency Recommended: 4-5x/wk    OT Assessment Statement:   At baseline/prior to admission: Patient was functioning at Household ambulation  Mobility:  Independent with  No assistive device.    Activities of Daily living:  Patient was independent with all ADLs  Instrumental Activities of Daily Living:  Patient was dependent for all IADLs at baseline.  Fall history: Did not obtain    Currently: The patient was seen for co-treatment session with PT Darl Pikes for added safety with functional mobility and due to pt's decreased activity tolerance. Pt was able to complete all transfers with close S, walked around room for approximately 99ft using FWW, and completed bed mobility with S. Vitals were monitored throughout session (see below) with patient reporting minimal dizziness with supine to sit transfer. Pt was impulsive with movements at times requiring education for safety.     DISCHARGE Recommendations for post acute OT:        Pending medical progress  Pending progress in next therapy session  Home with 24/7 support    DME recommended for Discharge:   Front Wheeled Walker     Adult    OT Recommendations during Hospital stay:  Recommend Client participate in ADLs sitting on standing at tolerated, transfer to bathroom or BSC for toileting, and sit up for meals outside of and in addition to OT session.      Precautions and Contraindications:   Seizure precautions  Falls  NPO   History of psychiatric problems including PTSD and schizoaffective disorder    HPI summary (per physician charting) and Pertinent Medical Details:  Admitted 02/12/2020 with/for "increasing abdominal pain associated with nausea vomiting for 3 days. Patient has multiple prior abdominal surgeries. Nonbloody emesis and no diarrhea. Patient denied any fever, chill, chest pain,  shortness of breath or sick contact. In the ED, there was evidence of small of obstruction and adhesion. General surgeon was consulted who took the patient to the OR for lysis of adhesion and exploration. Hospitalist was called for medical management.Patient was placed briefly on insulin drip for hyperglycemia in the OR and insulin drip was stopped in PACU. Patient is still n.p.o. as per general surgery recommendation except meds with small sips of water."  Copied from OT Eval    Goals:  STGs:  1. Pt will transfer to BSC/chair with LRAD at [facility] Assist level. Met  2. Pt will complete UB bathing/dressing with set up assist only. Ongoing  3. Pt will complete LB dressing/bathing with Min Assist and AD prn. Ongoing  4. Pt will performing toileting tasks at Min A level with AD prn. Partially Met  5. Pt will be independent with bed mobility. Ongoing     **goals set based on d/c to home and to be updated based on patient progression    -------------------------------------------------------------------------------------------------------------------------------    Treatment Plan: Continue plan of care     Subjective:   "My pain medications have me messed up"  Patient/family/caregiver consent to therapy session is noted by the participation in the therapy session.    Pain:  Wong-Baker FACES:       No Pain 0    OBJECTIVE:   Observation of Patient/Vital Signs:   Patient is seated in a bedside chair with telemetry, continuous pulse oximeter, peripheral IV, O2 at 2 liters/minute via nasal cannula  Patient's medical condition is appropriate for Occupational Therapy intervention at this time.    Vital Signs:  Seated:  BP  127/74 mmHg; HR  101 bpm; MAP 92 - at begininf of session    Orthostatic readings at end of session:  Supine:  BP  116/71 mmHg; HR  101 bpm; MAP 86  Seated:  BP  112/69 mmHg; HR  102 bpm; MAP 83  Standing after 1 minute:  BP  108/68 mmHg; HR  100; MAP 81  Standing after 3 minutes: BP 114/65; HR 103; MAP  81    SpO2 with activity on room air: varying from mid 80s to 92%, intermittent supplemental O2 used during session set at 2L via NC    Command following: Follows 1 step commands without difficulty  Alertness/Arousal: Appropriate responses to stimuli   Attention Span:Attends to task with redirection, Difficulty dividing attention  Safety Awareness: minimal verbal instruction    Behavior: Labile, Distractable, Anxious, Impulsive, Tearful         Activities of Daily Living:   TOILETING:  Supervision/Set up for clothing management up, clothing management down, perineal hygiene; bedside commode         Musculoskeletal and Balance Details:                       Bed Mobility:   Supine to Sit:   Supervision.   Cues for Log rolling.  Sit to Supine:   Supervision.   Cues for Log rolling.    Transfers:  Sit to Stand:  Supervision with Front wheeled walker.   Cues for Hand Placement  Stand to Sit:  Supervision.   Cues for Hand Placement  BSC: Supervision with Front wheeled walker.   Cues for Hand Placement  Functional mobility/ambulation: Supervision (assist x2 for safety) with Front wheeled walker.  for approximately 20 ft in room     Other Treatment Interventions this session:   Self care, functional mobility, AD safety and use, orthostatic BP readings    Education Provided:   TOPICS: role of occupational therapy, plan of care, goals of therapy and safety with mobility and ADLs, benefits of activity, pursed lip breathing, breathing strategies, use of adaptive equipment, bed mobility with use of adaptive equipment or strategy    Learner educated: Patient  Method: Explanation  Response to education: Verbalized understanding    Patient Position at End of Treatment:   Supine, in bed, Needs in reach, Bed/chair alarm set and No distress    Team Communication:   Spoke to : RN/LPN Boneta Lucks, PT Darl Pikes  Regarding: Pre-session re: patient status, Patient position at end of session, Patient participation with Therapy, Vital  signs  Whiteboard updated: No  OT/COTA communication: via written note and verbal communication as needed.    Time of treatment:   Time Calculation  OT Received On: 02/14/20  Start Time: 0935  Stop Time: 1006  Time Calculation (min): 31 min  Total Treatment Time (min): 31    Dallin Mccorkel [address], OTA
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
## 2020-02-14 NOTE — Plan of Care (Addendum)
NURSE NOTE SUMMARY  [facility] - MEDICAL SURGICAL UNIT   Patient Name: Tracy Bullock, Tracy Bullock   Attending Physician: Alwyn Ren, MD   Today's date:   02/14/2020 LOS: 2 days   Shift Summary:                                                              1930- Pt c/o of abdominal discomfort and nausea after eating dinner. PRN IV zofran administered. Assisted to bsc- no BM at this time, although passing gas. MD notified of pain. See new orders.   2113- PRN tylenol given for abdominal discomfort. Pt resting in bed at this time.  0015- Pt rang that they threw up, of clear dark brown emesis in emesis bag. Pt states "I feel much better". PRN IV zofran given.    0445- Pt took off 02 nasal cannula and continuous pulse ox. States "I can't sleep with that around me". 02 sats at this time 92%, denies shortness of breath. Pulse ox replaced- will continue to monitor.  1610- Pt c/o of shortness of breath. 02 sats 90%. Allowed nasal cannula to be placed back on. No other needs at this time.    Provider Notifications:        Rapid Response Notifications:  Mobility:      PMP Activity: Step 5 - Chair (02/14/2020  7:20 PM)     Weight tracking:  Family Dynamic:   Last 3 Weights for the past 72 hrs (Last 3 readings):   Weight   02/14/20 0340 85.1 kg (187 lb 9.8 oz)   02/12/20 0622 72.6 kg (160 lb)             Last Bowel Movement   No data recorded       Problem: Altered GI Function  Goal: Elimination patterns are normal or improving  Outcome: Progressing  Flowsheets (Taken 02/14/2020 2210)  Elimination patterns are normal or improving:   Report abnormal assessment to physician   Anticipate/assist with toileting needs   Assess for normal bowel sounds   Monitor for abdominal distension   Monitor for abdominal discomfort  Goal: Nutritional intake is adequate  Outcome: Progressing  Flowsheets (Taken 02/14/2020 2210)  Nutritional intake is adequate:   Assist patient with meals/food selection   Allow  adequate time for meals  Goal: Mobility/Activity is maintained at optimal level for patient  Outcome: Progressing  Flowsheets (Taken 02/14/2020 2210)  Mobility/activity is maintained at optimal level for patient:   Increase mobility as tolerated/progressive mobility   Encourage independent activity per ability   Plan activities to conserve energy, plan rest periods   Consult/collaborate with Physical Therapy and/or Occupational Therapy
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
## 2020-02-14 NOTE — Progress Note - Problem Oriented Charting Notewrit (Signed)
Cross cover note:    Called by nursing staff, oxygen saturations 88%.  Increasing oxygen requirement needed, up to 5 L.  It was noted that DuoNeb order has expired.    Plan: Restart duo nebs four times daily, 1st dose now.  Continue to monitor.  Consider cutting back on IV fluid.

## 2020-02-14 NOTE — Progress Notes (Signed)
Medicine Progress Note   [facility]  [employer]   Patient Name: Tracy Bullock, Tracy Bullock LOS: 2 days   Attending Physician: Alwyn Ren, MD PCP: Zollie Beckers, NP      Hospital Course:                                                            [name] a 54 y.o.femalepatient who presented to ED with increasing abdominal pain associated with nausea vomiting for 3 days. Patient has multiple prior abdominal surgeries. Nonbloody emesis and no diarrhea. Patient denied any fever, chill, chest pain, shortness of breath or sick contact. In the ED, there was evidence of small of obstruction and adhesion. General surgeon was consulted who took the patient to the OR for lysis of adhesion and exploration. Hospitalist was called for medical management.     Patient was placed briefly on insulin drip for hyperglycemia in the OR and insulin drip was stopped in PACU.  Patient is still n.p.o. as per general surgery recommendation except meds with small sips of water.  Since patient is n.p.o. Lantus was reduced to 50% of dosage compared to her home dosing in addition to sliding scale.  Rapid response was called today in the morning for a brief episode of presyncope.  Possibly orthostatic, patient continued on IV fluids, orthostats every morning.  Patient has multiple psychiatric problems including PTSD and schizoaffective disorder.  Her presyncope with some confusion is possibly also contributed by her anxiety.  Patient was hemodynamically stable during the rapid response.  Blood sugar was within normal limits.  When patient was talked to calmly she began responding appropriately and interactive again     Assessment and Plan:      Acute small bowel obstruction due to adhesions status post lysis and abdominal exploration-postop day 2  -As per surgery patient was started on clear liquid diet today  Further management per general surgeon.    Hyperglycemia in the setting of  insulin-dependent type 2 diabetes.   Briefly on insulin drip in the OR but now off the insulin drip.   As per chart review from care everywhere, patient is taking NovoLog 70-30 65 units twice daily.   -Since patient is started on a clear liquid diet today we will continue with Lantus 40 units and sliding scale every 6 hours for now.  Increase as needed.    Presyncopal episode-possibly orthostatic, ?? Anxiety, unlikely seizures given that patient was communicating during the episode.  -No orthostatic hypotension this morning  -telemetry  -May consider discontinuing IV fluids if patient is able to tolerate a clear liquid diet.    Acute transient hypoxia. Likely due to surgery.   -Currently on 2 L of nasal cannula  -incentive spirometry  -ambulation as per PT    Acute urinary retention  -we will insert an Foley, strict I's and O's  -hydroxyzine held given it has some anticholinergic properties    Lactic acidosis-resolved with hydration    Multiple electrolyte abnormalities like hypokalemia, hypomagnesemia, hypophosphatemia-possibly due to no oral intake since patient has been n.p.o., will replete    Seizure.  History of diffuse large B cell lymphoma diagnosed in 2008  Hypertension.  PTSD  History of schizoaffective disorder  History of status migrainosus  Lipitor,hydroxyzine, lisinopril, Metformin, risperidone,  Topamax, and trazodone.    DVT ZOX:[id number]  Dispo:Inpatient  Code:full code     Subjective   Patient was seen and examined at bedside, no acute distress was noted. Overnight needed 5 L of nasal cannula which is now downtrending to 2 L. Patient seems intermittently anxious and gets very tensed up. Started on clear liquid diet today.      Objective   Physical Exam:     Vitals: T:98.1 F (36.7 C) (Temporal), BP:(!) 122/103, HR:87, RR:15, SaO2:93%    General: Patient is awake. In no acute distress.  HEENT: No conjunctival drainage, vision is intact, anicteric sclera.  Neck: Supple, no  thyromegaly.  Chest: CTA bilaterally. No rhonchi, no wheezing. No use of accessory muscles.  CVS: Normal rate and regular rhythm no murmurs, without JVD, no pitting edema, pulses palpable.  Abdomen: Soft, mildly distended, mild tenderness on palpation near the umbilicus, dressing is clean dry and intact, no guarding or rigidity  Extremities: No calf swelling and no gross deformity.  Skin: Warm, dry, no rash and no worrisome lesions.  NEURO: No motor or sensory deficits.  Psychiatric: Alert, interactive, gets intermittently anxious and starts crying, gets tensed up very easily.  Weight Monitoring 02/12/2020 02/14/2020   Height 157.5 cm -   Height Method Stated -   Weight 72.576 kg 85.1 kg   Weight Method Stated Bed Scale   BMI (calculated) 29.3 kg/m2 -         Intake/Output Summary (Last 24 hours) at 02/14/2020 1422  Last data filed at 02/14/2020 0800  Gross per 24 hour   Intake 2601.97 ml   Output 450 ml   Net 2151.97 ml     Body mass index is 34.31 kg/m.     Meds:     Current Facility-Administered Medications   Medication Dose Route Frequency    albuterol-ipratropium  3 mL Nebulization 4x daily    enoxaparin  40 mg Subcutaneous Q24H    hydrOXYzine  25 mg Oral Daily    insulin glargine  40 Units Subcutaneous QHS    insulin lispro (1 Unit Dial)  2-24 Units Subcutaneous 4 times per day    lisinopril  20 mg Oral Daily    pantoprazole  40 mg Intravenous Q24H    potassium phosphate  30 mmol Intravenous Once    risperiDONE  1 mg Oral Daily    sodium chloride (PF)  3 mL Intravenous Q12H SCH    topiramate  50 mg Oral Daily    traZODone  200 mg Oral QHS      sodium chloride 150 mL/hr at 02/14/20 1030     PRN Meds: dextrose, diphenhydrAMINE, glucagon (rDNA), naloxone, naloxone, ondansetron.     LABS:     Estimated Creatinine Clearance: 112.8 mL/min (A) (based on SCr of 0.57 mg/dL (L)).  Recent Labs   Lab 02/14/20  0619 02/13/20  0956   WBC 8.7 11.1*   RBC 4.01 4.76   Hemoglobin 12.4 16.1*   Hematocrit 38.6 46.6    MCV 96 98   PLT CT 154 188             Lab Results   Component Value Date    HGBA1CPERCNT >14.0 02/12/2020     Recent Labs   Lab 02/14/20  0618 02/13/20  0956 02/13/20  0433 02/13/20  0433   Glucose 86 163*  More results in Results Review 205*   Sodium 137 136  More results in Results Review 134*   Potassium 3.4*  3.9  More results in Results Review 4.2   Chloride 110 106  More results in Results Review 105   CO2 18.6* 22.7  More results in Results Review 20.3   BUN 9 14  More results in Results Review 15   Creatinine 0.57* 0.69  More results in Results Review 0.66   EGFR 105 98  --  100   Calcium 8.3* 8.7  More results in Results Review 9.0   More results in Results Review = values in this interval not displayed.     Recent Labs   Lab 02/14/20  [phone] 02/14/20  0618 02/13/20  0956 02/12/20  1453 02/12/20  0634   Magnesium  --  1.5* 1.6  --   --    Phosphorus 1.9*  --   --   --   --    Albumin  --   --   --  3.2* 4.0   Protein, Total  --   --   --  6.2 7.2   Bilirubin, Total  --   --   --  1.6* 0.8   Alkaline Phosphatase  --   --   --  130 140   ALT  --   --   --  33 11   AST (SGOT)  --   --   --  167* 13     Recent Labs   Lab 02/12/20  0745   Urine Specific Gravity 1.020   pH, Urine 5.5   Protein, UR Negative   Glucose, UA >=1000*   Ketones UA 40*   Bilirubin, UA Negative   Blood, UA Negative   Nitrite, UA Negative   Urobilinogen, UA 0.2   Leukocyte Esterase, UA Negative      Patient Lines/Drains/Airways Status     Active PICC Line / CVC Line / PIV Line / Drain / Airway / Intraosseous Line / Epidural Line / ART Line / Line / Wound / Pressure Ulcer / NG/OG Tube     Name Placement date Placement time Site Days    Peripheral IV 02/12/20 20 G Left Antecubital 02/12/20  0630  Antecubital  2    Peripheral IV 02/12/20 20 G Right Hand 02/12/20  1517  Hand  1    External Urinary Catheter 02/13/20  1045    1    Wound 02/12/20 Surgical Incision Abdomen Other (Comment) 02/12/20  1546  Abdomen  1               CT Abdomen Pelvis  with IV Cont    Result Date: 02/12/2020  Distal small bowel obstruction with transition point in the upper right anterior pelvis, presumably secondary to postsurgical adhesions. Mild small bowel dilatation measuring up to 3.6 cm. Small volume of free fluid in the pelvis. ReadingStation:[id number]     Home Health Needs:  There are no questions and answers to display.       Nutrition assessment done in collaboration with Registered Dietitians:     Time spent:      Alwyn Ren, MD     02/14/20,2:22 PM   MRN: [id number]                                      CSN: [id number] DOB: 10/23/1965

## 2020-02-14 NOTE — Progress Notes (Signed)
PROGRESS  NOTE   GENERAL  SURGERY     Date Time:   02/14/20     7:14 AM    Patient Name:  Tracy Bullock, Tracy Bullock    Assessment:       --   SBO     S/p exploration w/ LOA on 02/12/20   POD # 2     Passing flatus    Wants something to drink     --   Multiple medical problems    Appreciate hospitalist input     --   Urinary retention    Nursing getting ready to replace foley cath       Plan:       --   Supportive tx     --   Advance to clear liquids       --   IV hydration     --   PCA     --   Pulm tx     --   DVT prophylaxis -- w/ PAS boots and Lovenox.          Subjective:       Presented w/ SBO.  Exploration on 02/12/20 w/ LOA.      Reasonably comfortable.      Passing flatus.      PCA for analgesia.      Will switch to PO analgesics soon -- when tolerating clears.      Labs reviewed.  H/H is 12.4/38.  WCT is normal.          Physical Exam:       Vitals:    02/14/20 0712   BP: 93/53   Pulse: 87   Resp: 20   Temp: 99 F (37.2 C)   SpO2: 92%       Intake and Output Summary (Last 24 hours) at Date Time    Intake/Output Summary (Last 24 hours) at 02/14/2020 0714  Last data filed at 02/14/2020 0500  Gross per 24 hour   Intake 3769.92 ml   Output 375 ml   Net 3394.92 ml           GENERAL:  Awake and cooperative.  Fair memory and judgement.    NEURO:  Moves all 4 extremities.  Ambulating in room.      HEENT:  Sclera is white.  Conjunctiva pink.        ABDOMEN:  Soft.  Less distended.      INCISION:   Lower-midline incision w/o sign of infection.      EXTREMITIES:   PAS boots in place.          Medications:       Current Facility-Administered Medications   Medication Dose Route Frequency    albuterol-ipratropium  3 mL Nebulization 4x daily    enoxaparin  40 mg Subcutaneous Q24H    hydrOXYzine  25 mg Oral Daily    insulin glargine  40 Units Subcutaneous QHS    insulin lispro (1 Unit Dial)  2-24 Units Subcutaneous 4 times per day    lisinopril  20 mg Oral Daily    pantoprazole  40 mg Intravenous Q24H    risperiDONE  1  mg Oral Daily    sodium chloride (PF)  3 mL Intravenous Q12H SCH    topiramate  50 mg Oral Daily    traZODone  200 mg Oral QHS         - - - - - - - - - - - - - - - - - - - - - -  CHEM:     Recent Labs   Lab 02/14/20  0619 02/14/20  0618 02/13/20  0956 02/13/20  0433 02/12/20  1453 02/12/20  0634   Glucose  --  86 163* 205* 310* 518*   Sodium  --  137 136 134* 136 130*   Potassium  --  3.4* 3.9 4.2 4.7 4.7   Chloride  --  110 106 105 101 93*   CO2  --  18.6* 22.7 20.3 24.5 18.3*   BUN  --  9 14 15 16 16    Creatinine  --  0.57* 0.69 0.66 0.78 1.05   Calcium  --  8.3* 8.7 9.0 9.3 10.0   Magnesium  --  1.5* 1.6  --   --   --    Phosphorus 1.9*  --   --   --   --   --          CBC:       Recent Labs   Lab 02/14/20  0619 02/13/20  0956 02/13/20  0433 02/12/20  0634   WBC 8.7 11.1* 10.8 12.2*   Hemoglobin 12.4 16.1* 15.4 17.3*   Hematocrit 38.6 46.6 47.6 52.9*   PLT CT 154 188 199 235   MCV 96 98 94 94       BANDS:          POCT:    Recent Labs   Lab 02/14/20  0527 02/14/20  0032 02/13/20  2116 02/13/20  1756 02/13/20  1253   Glucose POCT 83 87 91 113* 139*       LFTs:      Recent Labs   Lab 02/12/20  1453 02/12/20  0634   ALT 33 11   AST (SGOT) 167* 13   Alkaline Phosphatase 130 140   Bilirubin, Total 1.6* 0.8   Albumin 3.2* 4.0       COAG:          Lactate:    Recent Labs   Lab 02/13/20  2046 02/13/20  0956 02/12/20  0634   Lactic Acid 1.3 2.3* 2.3*           Radiology:  No results found.           Labs:     Results     Procedure Component Value Units Date/Time    Basic Metabolic Panel [[id number]]  (Abnormal) Collected: 02/14/20 0618    Specimen: Plasma Updated: 02/14/20 0706     Sodium 137 mMol/L      Potassium 3.4 mMol/L      Chloride 110 mMol/L      CO2 18.6 mMol/L      Calcium 8.3 mg/dL      Glucose 86 mg/dL      Creatinine 5.18 mg/dL      BUN 9 mg/dL      Anion Gap 84.1 mMol/L      BUN / Creatinine Ratio 15.8 Ratio      EGFR 105 mL/min/1.92m2      Osmolality Calculated 272 mOsm/kg     Magnesium  [[id number]]  (Abnormal) Collected: 02/14/20 0618    Specimen: Plasma Updated: 02/14/20 0706     Magnesium 1.5 mg/dL     Phosphorus [[id number]]  (Abnormal) Collected: 02/14/20 0619    Specimen: Plasma Updated: 02/14/20 0706     Phosphorus 1.9 mg/dL     CBC and differential [[id number]] Collected: 02/14/20 0619    Specimen: Blood Updated: 02/14/20 0647     WBC 8.7 K/cmm  RBC 4.01 M/cmm      Hemoglobin 12.4 gm/dL      Hematocrit 16.1 %      MCV 96 fL      MCH 31 pg      MCHC 32 gm/dL      RDW 09.6 %      PLT CT 154 K/cmm      MPV 9.3 fL      Neutrophils % 72.5 %      Lymphocytes 18.1 %      Monocytes 8.3 %      Eosinophils % 0.2 %      Basophils % 0.9 %      Neutrophils Absolute 6.3 K/cmm      Lymphocytes Absolute 1.6 K/cmm      Monocytes Absolute 0.7 K/cmm      Eosinophils Absolute 0.0 K/cmm      Basophils Absolute 0.1 K/cmm     Dextrose Stick Glucose [[id number]] Collected: 02/14/20 0527    Specimen: Blood Updated: 02/14/20 0543     Glucose POCT 83 mg/dL     Dextrose Stick Glucose [[id number]] Collected: 02/14/20 0032    Specimen: Blood Updated: 02/14/20 0049     Glucose POCT 87 mg/dL     Lactic Acid [[id number]] Collected: 02/13/20 2046    Specimen: Blood Updated: 02/13/20 2140     Lactic Acid 1.3 mMol/L     Dextrose Stick Glucose [[id number]] Collected: 02/13/20 2116    Specimen: Blood Updated: 02/13/20 2133     Glucose POCT 91 mg/dL     Dextrose Stick Glucose [[id number]]  (Abnormal) Collected: 02/13/20 1756    Specimen: Blood Updated: 02/13/20 1813     Glucose POCT 113 mg/dL     CBC and differential [[id number]]  (Abnormal) Collected: 02/13/20 0956    Specimen: Blood Updated: 02/13/20 1412     WBC 11.1 K/cmm      RBC 4.76 M/cmm      Hemoglobin 16.1 gm/dL      Hematocrit 03.4 %      MCV 98 fL      MCH 34 pg      MCHC 34 gm/dL      RDW 74.2 %      PLT CT 188 K/cmm      MPV 10.7 fL      Neutrophils % 63.0 %      Lymphocytes 26.0 %      Monocytes 9.0 %      Eosinophils % 2.0 %      Neutrophils Absolute 7.0 K/cmm       Lymphocytes Absolute 2.9 K/cmm      Monocytes Absolute 1.0 K/cmm      Eosinophils Absolute 0.2 K/cmm      Basophils Absolute 0.0 K/cmm      RBC Morphology RBC Morphology Reviewed    Narrative:      Manual differential performed    Lactic Acid [[id number]]  (Abnormal) Collected: 02/13/20 0956    Specimen: Blood Updated: 02/13/20 1345     Lactic Acid 2.3 mMol/L     Basic Metabolic Panel [[id number]]  (Abnormal) Collected: 02/13/20 0956    Specimen: Plasma Updated: 02/13/20 1341     Sodium 136 mMol/L      Potassium 3.9 mMol/L      Chloride 106 mMol/L      CO2 22.7 mMol/L      Calcium 8.7 mg/dL      Glucose 416 mg/dL      Creatinine 6.06 mg/dL  BUN 14 mg/dL      Anion Gap 44.0 mMol/L      BUN / Creatinine Ratio 20.3 Ratio      EGFR 98 mL/min/1.48m2      Osmolality Calculated 276 mOsm/kg     Magnesium [[id number]] Collected: 02/13/20 0956    Specimen: Plasma Updated: 02/13/20 1341     Magnesium 1.6 mg/dL     Dextrose Stick Glucose [[id number]]  (Abnormal) Collected: 02/13/20 1253    Specimen: Blood Updated: 02/13/20 1308     Glucose POCT 139 mg/dL     Dextrose Stick Glucose [[id number]]  (Abnormal) Collected: 02/13/20 0943    Specimen: Blood Updated: 02/13/20 0959     Glucose POCT 153 mg/dL             Rads:     Radiological Procedure reviewed.    @RAD2DAY @          Signed by:   Dineen Kid, MD          Although significant efforts were made to ensure accuracy of spelling and grammar, todays note was completed in large part by speech/voice recognition Chemical engineer) software and by keyboarding information into the electronic health care record in real time.  As such, there may be errors in grammar and spelling, insertion of incorrect words/phrases, pronoun errors, etc., that should be disregarded when reviewing this note.    JP

## 2020-02-15 ENCOUNTER — Inpatient Hospital Stay: Payer: PRIVATE HEALTH INSURANCE

## 2020-02-15 LAB — CBC AND DIFFERENTIAL
Basophils %: 0.4 % (ref 0.0–3.0)
Basophils Absolute: 0 10*3/uL (ref 0.0–0.3)
Eosinophils %: 0 % (ref 0.0–7.0)
Eosinophils Absolute: 0 10*3/uL (ref 0.0–0.8)
Hematocrit: 38.8 % (ref 36.0–48.0)
Hemoglobin: 12.6 gm/dL (ref 12.0–16.0)
Lymphocytes Absolute: 1.6 10*3/uL (ref 0.6–5.1)
Lymphocytes: 18.1 % (ref 15.0–46.0)
MCH: 31 pg (ref 28–35)
MCHC: 33 gm/dL (ref 31–36)
MCV: 95 fL (ref 80–100)
MPV: 9 fL (ref 6.0–10.0)
Monocytes Absolute: 0.7 10*3/uL (ref 0.1–1.7)
Monocytes: 8.2 % (ref 3.0–15.0)
Neutrophils %: 73.3 % (ref 42.0–78.0)
Neutrophils Absolute: 6.6 10*3/uL (ref 1.7–8.6)
PLT CT: 190 10*3/uL (ref 130–440)
RBC: 4.08 10*6/uL (ref 3.80–5.00)
RDW: 12.1 % (ref 10.5–14.5)
WBC: 9 10*3/uL (ref 4.0–11.0)

## 2020-02-15 LAB — BASIC METABOLIC PANEL
Anion Gap: 16.5 mMol/L (ref 7.0–18.0)
BUN / Creatinine Ratio: 18.8 Ratio (ref 10.0–30.0)
BUN: 12 mg/dL (ref 7–22)
CO2: 18.2 mMol/L — ABNORMAL LOW (ref 20.0–30.0)
Calcium: 9 mg/dL (ref 8.5–10.5)
Chloride: 107 mMol/L (ref 98–110)
Creatinine: 0.64 mg/dL (ref 0.60–1.20)
EGFR: 101 mL/min/{1.73_m2} (ref 60–150)
Glucose: 153 mg/dL — ABNORMAL HIGH (ref 71–99)
Osmolality Calculated: 278 mOsm/kg (ref 275–300)
Potassium: 3.7 mMol/L (ref 3.5–5.3)
Sodium: 138 mMol/L (ref 136–147)

## 2020-02-15 LAB — MAGNESIUM: Magnesium: 1.8 mg/dL (ref 1.6–2.6)

## 2020-02-15 LAB — VH DEXTROSE STICK GLUCOSE
Glucose POCT: 175 mg/dL — ABNORMAL HIGH (ref 71–99)
Glucose POCT: 142 mg/dL — ABNORMAL HIGH (ref 71–99)
Glucose POCT: 163 mg/dL — ABNORMAL HIGH (ref 71–99)
Glucose POCT: 181 mg/dL — ABNORMAL HIGH (ref 71–99)

## 2020-02-15 LAB — PHOSPHORUS: Phosphorus: 2.6 mg/dL (ref 2.3–4.7)

## 2020-02-15 MED ORDER — FUROSEMIDE 10 MG/ML IJ SOLN
40.0000 mg | Freq: Once | INTRAMUSCULAR | Status: AC
Start: 2020-02-15 — End: 2020-02-15
  Administered 2020-02-15: 12:00:00 40 mg via INTRAVENOUS
  Filled 2020-02-15: qty 4

## 2020-02-15 MED ORDER — SALINE NASAL SPRAY 0.65 % NA SOLN
1.0000 | NASAL | Status: DC | PRN
Start: 2020-02-15 — End: 2020-02-17
  Administered 2020-02-15: 12:00:00 1 via NASAL
  Filled 2020-02-15: qty 44

## 2020-02-15 NOTE — Plan of Care (Addendum)
NURSE NOTE SUMMARY  [facility] - MEDICAL SURGICAL UNIT   Patient Name: Tracy Bullock, Tracy Bullock   Attending Physician: Alwyn Ren, MD   Today's date:   02/15/2020 LOS: 3 days   Shift Summary:                                                              0700 - Assumed care of patient. Patient complaining of not being able to breath. Repositioned, O2 sats WNL. Patient states it is slightly better sitting up. MD aware. Bed alarm on, call bell within reach. Will continue to monitor. Chest xray ordered  0900 - Foley in place, low urine output overnight and this morning. Urine dark, malodorous. MD aware.  1000 - Patient is noticeably more swollen in the arms and legs. MD aware. See orders.Patient refusing to move to the chair. Refusing to eat.  1200 - Patient refused lunch. Refusing adamantly to be repositioned even in the bed.She finally allowed [place] to pull her up and sit up straighter. Education given on importance of progressive movement for return of GI function, improvement of general malaise and discomfort.  1400 - Patient vomited 600 mL of emesis and immediately stated she felt better. Up to chair. Per MD, NPO if patient begins to feel sick again.  1800 - Patient complaining of nausea. NPO, ice chips at bedside.  1900 - Report given to oncoming RN. Reviewed patient assessment, plan of care, discharge planning. Patient verbalizes understanding of change of shift. No further questions from RN or patient. Patient in bed, call bell within reach, bed alarm on, denies further needs.       Provider Notifications:        Rapid Response Notifications:  Mobility:      PMP Activity: Step 5 - Chair (02/14/2020  7:20 PM)     Weight tracking:  Family Dynamic:   Last 3 Weights for the past 72 hrs (Last 3 readings):   Weight   02/14/20 0340 85.1 kg (187 lb 9.8 oz)             Last Bowel Movement   No data recorded      Problem: Altered GI Function  Goal: Fluid and electrolyte balance are  achieved/maintained  Outcome: Progressing  Flowsheets (Taken 02/13/2020 2222 by Horace Porteous, RN)  Fluid and electrolyte balance are achieved/maintained:   Monitor intake and output every shift   Monitor/assess lab values and report abnormal values   Provide adequate hydration   Assess for confusion/personality changes   Monitor daily weight   Assess and reassess fluid and electrolyte status   Observe for seizure activity and initiate seizure precautions if indicated   Observe for cardiac arrhythmias  Note: Lab values monitored, stricto I&O     Problem: Compromised Tissue integrity  Goal: Nutritional status is improving  Outcome: Not Progressing  Flowsheets (Taken 02/15/2020 1124)  Nutritional status is improving:   Assist patient with eating   Allow adequate time for meals   Include patient/patient care companion in decisions related to nutrition  Note: Patient back to full liquids due to stomach discomfort

## 2020-02-15 NOTE — Progress Notes (Signed)
Medicine Progress Note   [facility]  [employer]   Patient Name: Tracy Bullock, Tracy Bullock LOS: 3 days   Attending Physician: Alwyn Ren, MD PCP: Zollie Beckers, NP      Hospital Course:                                                            [name] a 54 y.o.femalepatient who presented to ED with increasing abdominal pain associated with nausea vomiting for 3 days. Patient has multiple prior abdominal surgeries. Nonbloody emesis and no diarrhea. Patient denied any fever, chill, chest pain, shortness of breath or sick contact. In the ED, there was evidence of small of obstruction and adhesion. General surgeon was consulted who took the patient to the OR for lysis of adhesion and exploration. Hospitalist was called for medical management.    Patient was placed briefly on insulin drip for hyperglycemia in the OR and insulin drip was stopped in PACU. Patient is still n.p.o. as per general surgery recommendation except meds with small sips of water. Since patient is n.p.o. Lantus was reduced to 50% of dosage compared to her home dosing in addition to sliding scale. Rapid response was called today in the morning for a brief episode of presyncope. Possibly orthostatic,patient continued on IV fluids, orthostats every morning. Patient has multiple psychiatric problems including PTSD and schizoaffective disorder. Her presyncope with some confusion is possibly also contributed by her anxiety. Patient was hemodynamically stable during the rapid response. Blood sugar was within normal limits. When patient was talked to calmly she began responding appropriately and interactive again     Assessment and Plan:      Acute small bowel obstruction due to adhesions status post lysis and abdominal exploration-postop day 3  - Diet per surgery  Further management per general surgeon.    Persistent acute hypoxic respiratory failure.  Thought to be due to surgery.  However not  been able to wean.  Physical examination consistent with volume overload.  Chest x-ray consistent with vascular congestion.  IV Lasix x1.  [place] fluid.  Encourage oral intake.  Strict in and out.  Incentive spirometry    Hyperglycemia in the setting of insulin-dependent type 2 diabetes.   Briefly on insulin dripin the ORbut now off the insulin drip.   As per chart review from care everywhere, patient is taking NovoLog 70-30 65 units twice daily.  -Since patient is started on a clear liquid diet today we will continue with Lantus 40 units.  Further adjustment accordingly    Presyncopal episode-possibly orthostatic, ??Anxiety,unlikely seizures given that patient was communicating during the episode.  No more episode.  -Supportive care    Acute urinary retention likely due to effect of anesthesia  -we will insert an Foley, strict I's and O's  -hydroxyzine held given it has some anticholinergic properties  Voiding trial today    Seizure.  History of diffuse large B cell lymphoma diagnosed in 2008  Hypertension.  PTSD  History of schizoaffective disorder  History of status migrainosus  Lipitor, lisinopril, Metformin, risperidone, Topamax, and trazodone.    Disposition: Inpatient.  Pending further improvement  DVT PPX:  Lovenox  Code:  Full Code     Subjective   Minimal abdominal pain.  Was tolerating diet.  Passing a  lot of flatus but no bowel movement yet.  Denies any fever, chill, chest pain.  However he endorsed worsening shortness of breath        Objective   Physical Exam:       Vitals: T:(!) 95.9 F (35.5 C) (Temporal), BP:118/68, HR:93, RR:20, SaO2:90%    General: Patient is awake. In no acute distress.  HEENT: No conjunctival drainage, vision is intact, anicteric sclera.  Neck: Supple, no thyromegaly.  Chest: Bilateral crackle  CVS: Normal rate and regular rhythm no murmurs, without JVD, no pitting edema, pulses palpable.  Abdomen: Soft, mild tenderness, no guarding or rigidity, hypoactive bowel  sounds.  Dressing over abdominal surgical site.  No purulent discharge.  Extremities: Trace pitting edema? and no gross deformity.  Skin: Warm, dry, no rash and no worrisome lesions.  NEURO: No motor or sensory deficits.  Psychiatric: Alert, interactive, appropriate, normal affect.  Weight Monitoring 02/12/2020 02/14/2020   Height 157.5 cm -   Height Method Stated -   Weight 72.576 kg 85.1 kg   Weight Method Stated Bed Scale   BMI (calculated) 29.3 kg/m2 -         Intake/Output Summary (Last 24 hours) at 02/15/2020 1205  Last data filed at 02/15/2020 1052  Gross per 24 hour   Intake 1730 ml   Output 1075 ml   Net 655 ml     Body mass index is 34.31 kg/m.     Meds:     Current Facility-Administered Medications   Medication Dose Route Frequency    enoxaparin  40 mg Subcutaneous Q24H    insulin glargine  40 Units Subcutaneous QHS    insulin lispro (1 Unit Dial)  2-24 Units Subcutaneous TID AC & HS    lisinopril  20 mg Oral Daily    pantoprazole  40 mg Intravenous Q24H    risperiDONE  1 mg Oral Daily    sodium chloride (PF)  3 mL Intravenous Q12H SCH    topiramate  50 mg Oral Daily    traZODone  200 mg Oral QHS       PRN Meds: acetaminophen, dextrose, diphenhydrAMINE, glucagon (rDNA), naloxone, naloxone, ondansetron, sodium chloride.     LABS:     Estimated Creatinine Clearance: 100.5 mL/min (based on SCr of 0.64 mg/dL).  Recent Labs   Lab 02/15/20  0619 02/14/20  0619   WBC 9.0 8.7   RBC 4.08 4.01   Hemoglobin 12.6 12.4   Hematocrit 38.8 38.6   MCV 95 96   PLT CT 190 154             Lab Results   Component Value Date    HGBA1CPERCNT >14.0 02/12/2020     Recent Labs   Lab 02/15/20  0619 02/14/20  0618 02/13/20  0956 02/13/20  0956   Glucose 153* 86  More results in Results Review 163*   Sodium 138 137  More results in Results Review 136   Potassium 3.7 3.4*  More results in Results Review 3.9   Chloride 107 110  More results in Results Review 106   CO2 18.2* 18.6*  More results in Results Review 22.7   BUN 12  9  More results in Results Review 14   Creatinine 0.64 0.57*  More results in Results Review 0.69   EGFR 101 105  --  98   Calcium 9.0 8.3*  More results in Results Review 8.7   More results in Results Review = values in this interval  not displayed.     Recent Labs   Lab 02/15/20  [phone] 02/14/20  0619 02/14/20  0618 02/13/20  0956 02/12/20  1453 02/12/20  0634   Magnesium 1.8  --  1.5*  More results in Results Review  --   --    Phosphorus 2.6 1.9*  --   --   --   --    Albumin  --   --   --   --  3.2* 4.0   Protein, Total  --   --   --   --  6.2 7.2   Bilirubin, Total  --   --   --   --  1.6* 0.8   Alkaline Phosphatase  --   --   --   --  130 140   ALT  --   --   --   --  33 11   AST (SGOT)  --   --   --   --  167* 13   More results in Results Review = values in this interval not displayed.     Recent Labs   Lab 02/12/20  0745   Urine Specific Gravity 1.020   pH, Urine 5.5   Protein, UR Negative   Glucose, UA >=1000*   Ketones UA 40*   Bilirubin, UA Negative   Blood, UA Negative   Nitrite, UA Negative   Urobilinogen, UA 0.2   Leukocyte Esterase, UA Negative      Patient Lines/Drains/Airways Status     Active PICC Line / CVC Line / PIV Line / Drain / Airway / Intraosseous Line / Epidural Line / ART Line / Line / Wound / Pressure Ulcer / NG/OG Tube     Name Placement date Placement time Site Days    Peripheral IV 02/12/20 20 G Left Antecubital 02/12/20  0630  Antecubital  3    Urethral Catheter 10 Fr. 02/14/20  1100    1    Wound 02/12/20 Surgical Incision Abdomen Other (Comment) 02/12/20  1546  Abdomen  2               CT Abdomen Pelvis with IV Cont    Result Date: 02/12/2020  Distal small bowel obstruction with transition point in the upper right anterior pelvis, presumably secondary to postsurgical adhesions. Mild small bowel dilatation measuring up to 3.6 cm. Small volume of free fluid in the pelvis. ReadingStation:[email or web address]    XR Chest AP Portable    Result Date: 02/15/2020  There is faint right perihilar airspace  disease which could represent developing infection or mild asymmetric edema. ReadingStation:[email or web address]     Home Health Needs:  There are no questions and answers to display.       Nutrition assessment done in collaboration with Registered Dietitians:     Time spent:      Chinita Greenland, MD     02/15/20,12:05 PM   MRN: [id number]                                      CSN: [id number] DOB: 1965-09-22

## 2020-02-15 NOTE — Progress Notes (Signed)
Hospital Surgery Progress Note    Date Time: 02/15/20 11:47 AM  Patient Name: Tracy Bullock, Tracy Bullock  Date of Birth: [birth year]  Admission Date: 02/12/2020  LOS: 3 Days    Note by: Doylene Canard, MD    Assessment:    POD 3 with slowly returning GI fx.    Recommendation or Plan:    Continue with Full liquid diet today.  Ambulation.  IV fluids/Lasix per medicine.    Subjective:       Felt worse this am after vomiting X 1.  Now better but discouraged.  Passing some flatus.      Medications:  Current Facility-Administered Medications   Medication Dose Route Frequency    enoxaparin  40 mg Subcutaneous Q24H    furosemide  40 mg Intravenous Once    insulin glargine  40 Units Subcutaneous QHS    insulin lispro (1 Unit Dial)  2-24 Units Subcutaneous TID AC & HS    lisinopril  20 mg Oral Daily    pantoprazole  40 mg Intravenous Q24H    risperiDONE  1 mg Oral Daily    sodium chloride (PF)  3 mL Intravenous Q12H SCH    topiramate  50 mg Oral Daily    traZODone  200 mg Oral QHS     acetaminophen, dextrose, diphenhydrAMINE, glucagon (rDNA), naloxone, naloxone, ondansetron, sodium chloride    ROS:  No chest pain  No dyspnea now      Physical Exam:  Vitals:    02/15/20 0903   BP: 119/78   Pulse:    Resp:    Temp:    SpO2:        Temperature: Temp  Min: 95.9 F (35.5 C)  Max: 100 F (37.8 C)  Pulse: Pulse  Min: 66  Max: 91  Respiratory: Resp  Min: 18  Max: 24  Non-Invasive BP: BP  Min: 91/72  Max: 119/78  Pulse Oximetry SpO2  Min: 87 %  Max: 93 %    NAD  Abd: soft, minimal distension. Dressing dry.    LABS:  Recent Labs   Lab 02/15/20  0619 02/14/20  0619 02/13/20  0956   WBC 9.0 8.7 11.1*   RBC 4.08 4.01 4.76   Hemoglobin 12.6 12.4 16.1*   Hematocrit 38.8 38.6 46.6   MCV 95 96 98   PLT CT 190 154 188       Recent Labs   Lab 02/15/20  0619 02/14/20  0618 02/13/20  0956 02/13/20  0433 02/12/20  1453   Sodium 138 137 136 134* 136   Potassium 3.7 3.4* 3.9 4.2 4.7   Chloride 107 110 106 105 101   CO2 18.2* 18.6* 22.7 20.3 24.5    BUN 12 9 14 15 16    Creatinine 0.64 0.57* 0.69 0.66 0.78   Glucose 153* 86 163* 205* 310*   Calcium 9.0 8.3* 8.7 9.0 9.3   Magnesium 1.8 1.5* 1.6  --   --        Recent Labs   Lab 02/12/20  1453 02/12/20  0634   ALT 33 11   AST (SGOT) 167* 13   Bilirubin, Total 1.6* 0.8   Albumin 3.2* 4.0   Alkaline Phosphatase 130 140             Imaging:    XR Chest AP Portable    Result Date: 02/15/2020  There is faint right perihilar airspace disease which could represent developing infection or mild asymmetric edema. ReadingStation:[email or web address]  Signed by Tito Dine, MD

## 2020-02-15 NOTE — Plan of Care (Signed)
Great urine output after lasix and her breathing is much better now off the oxygen. Clear urine draining.  However BP is slightly low normal side. Asymptomatic. Will  Continue to monitor. If not improving, we will try dose of IV albumin.

## 2020-02-16 LAB — COMPREHENSIVE METABOLIC PANEL
ALT: 10 U/L (ref 0–55)
AST (SGOT): 17 U/L (ref 10–42)
Albumin/Globulin Ratio: 0.72 Ratio — ABNORMAL LOW (ref 0.80–2.00)
Albumin: 2.3 gm/dL — ABNORMAL LOW (ref 3.5–5.0)
Alkaline Phosphatase: 132 U/L (ref 40–145)
Anion Gap: 19 mMol/L — ABNORMAL HIGH (ref 7.0–18.0)
BUN / Creatinine Ratio: 18.6 Ratio (ref 10.0–30.0)
BUN: 13 mg/dL (ref 7–22)
Bilirubin, Total: 0.6 mg/dL (ref 0.1–1.2)
CO2: 19.2 mMol/L — ABNORMAL LOW (ref 20.0–30.0)
Calcium: 9.3 mg/dL (ref 8.5–10.5)
Chloride: 108 mMol/L (ref 98–110)
Creatinine: 0.7 mg/dL (ref 0.60–1.20)
EGFR: 98 mL/min/{1.73_m2} (ref 60–150)
Globulin: 3.2 gm/dL (ref 2.0–4.0)
Glucose: 171 mg/dL — ABNORMAL HIGH (ref 71–99)
Osmolality Calculated: 289 mOsm/kg (ref 275–300)
Potassium: 3.2 mMol/L — ABNORMAL LOW (ref 3.5–5.3)
Protein, Total: 5.5 gm/dL — ABNORMAL LOW (ref 6.0–8.3)
Sodium: 143 mMol/L (ref 136–147)

## 2020-02-16 LAB — VH DEXTROSE STICK GLUCOSE
Glucose POCT: 174 mg/dL — ABNORMAL HIGH (ref 71–99)
Glucose POCT: 154 mg/dL — ABNORMAL HIGH (ref 71–99)
Glucose POCT: 131 mg/dL — ABNORMAL HIGH (ref 71–99)
Glucose POCT: 129 mg/dL — ABNORMAL HIGH (ref 71–99)
Glucose POCT: 151 mg/dL — ABNORMAL HIGH (ref 71–99)

## 2020-02-16 LAB — MAGNESIUM
Magnesium: 1.5 mg/dL — ABNORMAL LOW (ref 1.6–2.6)
Magnesium: 1.6 mg/dL (ref 1.6–2.6)

## 2020-02-16 LAB — BASIC METABOLIC PANEL
Anion Gap: 18.7 mMol/L — ABNORMAL HIGH (ref 7.0–18.0)
BUN / Creatinine Ratio: 23.9 Ratio (ref 10.0–30.0)
BUN: 16 mg/dL (ref 7–22)
CO2: 19.8 mMol/L — ABNORMAL LOW (ref 20.0–30.0)
Calcium: 9 mg/dL (ref 8.5–10.5)
Chloride: 105 mMol/L (ref 98–110)
Creatinine: 0.67 mg/dL (ref 0.60–1.20)
EGFR: 99 mL/min/{1.73_m2} (ref 60–150)
Glucose: 148 mg/dL — ABNORMAL HIGH (ref 71–99)
Osmolality Calculated: 283 mOsm/kg (ref 275–300)
Potassium: 3.5 mMol/L (ref 3.5–5.3)
Sodium: 140 mMol/L (ref 136–147)

## 2020-02-16 MED ORDER — FUROSEMIDE 10 MG/ML IJ SOLN
20.0000 mg | Freq: Once | INTRAMUSCULAR | Status: AC
Start: 2020-02-16 — End: 2020-02-16
  Administered 2020-02-16: 10:00:00 20 mg via INTRAVENOUS
  Filled 2020-02-16: qty 2

## 2020-02-16 MED ORDER — MAGNESIUM SULFATE IN D5W 1-5 GM/100ML-% IV SOLN
1.0000 g | Freq: Once | INTRAVENOUS | Status: AC
Start: 2020-02-16 — End: 2020-02-16
  Administered 2020-02-16: 22:00:00 1 g via INTRAVENOUS
  Filled 2020-02-16: qty 100

## 2020-02-16 MED ORDER — VH POTASSIUM CHLORIDE CRYS ER 20 MEQ PO TBCR (WRAP)
40.0000 meq | EXTENDED_RELEASE_TABLET | Freq: Once | ORAL | Status: AC
Start: 2020-02-16 — End: 2020-02-16
  Administered 2020-02-16: 22:00:00 40 meq via ORAL
  Filled 2020-02-16 (×2): qty 2

## 2020-02-16 MED ORDER — POTASSIUM CHLORIDE 10 MEQ/100ML IV SOLN
10.0000 meq | INTRAVENOUS | Status: AC
Start: 2020-02-16 — End: 2020-02-16
  Administered 2020-02-16 (×4): 10 meq via INTRAVENOUS
  Filled 2020-02-16 (×4): qty 100

## 2020-02-16 MED ORDER — MAGNESIUM SULFATE IN D5W 1-5 GM/100ML-% IV SOLN
1.0000 g | Freq: Once | INTRAVENOUS | Status: AC
Start: 2020-02-16 — End: 2020-02-16
  Administered 2020-02-16: 11:00:00 1 g via INTRAVENOUS
  Filled 2020-02-16: qty 100

## 2020-02-16 NOTE — Plan of Care (Signed)
NURSE NOTE SUMMARY  [facility] - MEDICAL SURGICAL UNIT   Patient Name: Tracy Bullock, Tracy Bullock   Attending Physician: Alwyn Ren, MD   Today's date:   02/16/2020 LOS: 4 days   Shift Summary:                                                              0715-Bedresting. Awake and alert. No visible sign of distress. Telemetry maintained. NSR in 60's on monitor. No ectopy. NPO except ice chips. Voiced mild nausea at rest. No c/o pain. Foley patent and draining clear, concentrated urine. Denied needs. Call bell and personal items in reach.   1000-Lasix 20mg  IV administered per order. Potassium boluses in progress. Zofran 4mg  IV administered for c/o continued nausea.  1035-Bedresting. No c/o pain or nausea.   1410-Zofran 4mg  IV administered following vomiting light brownish yellow emesis with foul, stool-like odor. Assisted with linen change. Reassurance provided. Explanation of post-op bowel surgery and rationale for NPO status, awaiting resolution of nausea, bowel movement. Voiced understanding. Activity encouraged. Up in chair 1.75 hours this morning.   1630-Has been up ambulating in hall x2 with steady gait and use of walker. Stand by assist. No c/o nausea or pain. Upon return to room, up in chair. Call bell and personal items in reach.      Provider Notifications:        Rapid Response Notifications:  Mobility:      PMP Activity: Step 3 - Bed Mobility (02/16/2020  7:15 AM)     Weight tracking:  Family Dynamic:   Last 3 Weights for the past 72 hrs (Last 3 readings):   Weight   02/14/20 0340 85.1 kg (187 lb 9.8 oz)             Last Bowel Movement   No data recorded        Problem: Moderate/High Fall Risk Score >5  Goal: Patient will remain free of falls  Outcome: Progressing  Flowsheets (Taken 02/16/2020 0715)  VH High Risk (Greater than 13):   ALL REQUIRED LOW INTERVENTIONS   ALL REQUIRED MODERATE INTERVENTIONS   RED "HIGH FALL RISK" SIGNAGE   PATIENT IS TO BE SUPERVISED FOR ALL TOILETING  ACTIVITIES   Keep door open for better visibility     Problem: Altered GI Function  Goal: Fluid and electrolyte balance are achieved/maintained  Outcome: Progressing  Flowsheets (Taken 02/16/2020 1622)  Fluid and electrolyte balance are achieved/maintained:   Monitor intake and output every shift   Monitor/assess lab values and report abnormal values   Provide adequate hydration   Assess for confusion/personality changes   Observe for cardiac arrhythmias   Observe for seizure activity and initiate seizure precautions if indicated   Assess and reassess fluid and electrolyte status  Goal: Elimination patterns are normal or improving  Outcome: Progressing  Flowsheets (Taken 02/14/2020 2210 by Chesley Mires, RN)  Elimination patterns are normal or improving:   Report abnormal assessment to physician   Anticipate/assist with toileting needs   Assess for normal bowel sounds   Monitor for abdominal distension   Monitor for abdominal discomfort  Goal: Nutritional intake is adequate  Outcome: Progressing  Flowsheets (Taken 02/16/2020 1622)  Nutritional intake is adequate:   Encourage/perform oral hygiene as appropriate  Assess anorexia, appetite, and amount of meal/food tolerated  Goal: Mobility/Activity is maintained at optimal level for patient  Outcome: Progressing  Flowsheets (Taken 02/14/2020 2210 by Chesley Mires, RN)  Mobility/activity is maintained at optimal level for patient:   Increase mobility as tolerated/progressive mobility   Encourage independent activity per ability   Plan activities to conserve energy, plan rest periods   Consult/collaborate with Physical Therapy and/or Occupational Therapy  Goal: No bleeding  Outcome: Progressing  Flowsheets (Taken 02/13/2020 1058 by Magda Bernheim, Davonna Belling, RN)  No bleeding:   Monitor and assess vitals and hemodynamic parameters   Monitor/assess lab values and report abnormal values   Assess for bruising/petechia     Problem: Compromised Tissue  integrity  Goal: Damaged tissue is healing and protected  Outcome: Progressing  Flowsheets (Taken 02/13/2020 2222 by Horace Porteous, RN)  Damaged tissue is healing and protected:   Monitor/assess Braden scale every shift   Provide wound care per wound care algorithm   Reposition patient every 2 hours and as needed unless able to reposition self   Increase activity as tolerated/progressive mobility   Relieve pressure to bony prominences for patients at moderate and high risk   Avoid shearing injuries   Keep intact skin clean and dry   Use bath wipes, not soap and water, for daily bathing   Use incontinence wipes for cleaning urine, stool and caustic drainage. Foley care as needed   Monitor patient's hygiene practices  Goal: Nutritional status is improving  Outcome: Progressing  Flowsheets (Taken 02/16/2020 1622)  Nutritional status is improving: Include patient/patient care companion in decisions related to nutrition

## 2020-02-16 NOTE — Progress Notes (Signed)
Hospital Surgery Progress Note    Date Time: 02/16/20 2:23 PM  Patient Name: Tracy Bullock, Tracy Bullock  Date of Birth: 1965/04/11  Admission Date: 02/12/2020  LOS: 4 Days    Note by: Doylene Canard, MD    Assessment:    POD 4 post lysis adhesions, still with some vomiting.  Slow return GI fx.  Low K this am of 3.2    Recommendation or Plan:    Keep NPO for now  Advance activity.  Has been having K replacement.    Subjective:        Vomiting 150 cc this pm (300 yesterday).  Passing some flatus.  No BM.  Feels better now.    Medications:  Current Facility-Administered Medications   Medication Dose Route Frequency    enoxaparin  40 mg Subcutaneous Q24H    insulin glargine  40 Units Subcutaneous QHS    insulin lispro (1 Unit Dial)  2-24 Units Subcutaneous TID AC & HS    pantoprazole  40 mg Intravenous Q24H    risperiDONE  1 mg Oral Daily    sodium chloride (PF)  3 mL Intravenous Q12H SCH    topiramate  50 mg Oral Daily    traZODone  200 mg Oral QHS     acetaminophen, dextrose, diphenhydrAMINE, glucagon (rDNA), naloxone, naloxone, ondansetron, sodium chloride    ROS:  No chest pain  No dyspnea    Physical Exam:  Vitals:    02/16/20 0815   BP: 104/56   Pulse: (!) 58   Resp: 20   Temp: 99.3 F (37.4 C)   SpO2: 90%       Temperature: Temp  Min: 98.2 F (36.8 C)  Max: 99.3 F (37.4 C)  Pulse: Pulse  Min: 58  Max: 94  Respiratory: Resp  Min: 16  Max: 20  Non-Invasive BP: BP  Min: 104/56  Max: 111/71  Pulse Oximetry SpO2  Min: 90 %  Max: 93 %    Intake and Output Summary (Last 24 hours) at Date Time    Intake/Output Summary (Last 24 hours) at 02/16/2020 1423  Last data filed at 02/16/2020 1200  Gross per 24 hour   Intake 1450 ml   Output 3525 ml   Net -2075 ml      Wt Readings from Last 3 Encounters:   02/14/20 85.1 kg (187 lb 9.8 oz)       NAD  Abd: soft, flat, minimal tenderness.  Wound healing well.  No significant distension.    LABS:  Recent Labs   Lab 02/15/20  0619 02/14/20  0619 02/13/20  0956   WBC 9.0 8.7 11.1*    RBC 4.08 4.01 4.76   Hemoglobin 12.6 12.4 16.1*   Hematocrit 38.8 38.6 46.6   MCV 95 96 98   PLT CT 190 154 188       Recent Labs   Lab 02/16/20  0506 02/15/20  0619 02/14/20  0618 02/13/20  0956 02/13/20  0433   Sodium 143 138 137 136 134*   Potassium 3.2* 3.7 3.4* 3.9 4.2   Chloride 108 107 110 106 105   CO2 19.2* 18.2* 18.6* 22.7 20.3   BUN 13 12 9 14 15    Creatinine 0.70 0.64 0.57* 0.69 0.66   Glucose 171* 153* 86 163* 205*   Calcium 9.3 9.0 8.3* 8.7 9.0   Magnesium 1.5* 1.8 1.5* 1.6  --        Recent Labs   Lab 02/16/20  0506 02/12/20  1453 02/12/20  0634   ALT 10 33 11   AST (SGOT) 17 167* 13   Bilirubin, Total 0.6 1.6* 0.8   Albumin 2.3* 3.2* 4.0   Alkaline Phosphatase 132 130 140         Signed by Doylene Canard, MD

## 2020-02-16 NOTE — Progress Notes (Signed)
F/u appt made for Thursday December 2nd at 9:00 with Britt Boozer.

## 2020-02-16 NOTE — Plan of Care (Addendum)
NURSE NOTE SUMMARY  [facility] - MEDICAL SURGICAL UNIT   Patient Name: Tracy Bullock, Tracy Bullock   Attending Physician: Alwyn Ren, MD   Today's date:   02/16/2020 LOS: 4 days   Shift Summary:                                                              1610: patient vomited brown content. IV Zofran administered. Oral care done and gown changed. Will continue to monitor.   Provider Notifications:        Rapid Response Notifications:  Mobility:      PMP Activity: Step 3 - Bed Mobility (02/15/2020  7:30 PM)     Weight tracking:  Family Dynamic:   Last 3 Weights for the past 72 hrs (Last 3 readings):   Weight   02/14/20 0340 85.1 kg (187 lb 9.8 oz)             Last Bowel Movement   No data recorded        Problem: Moderate/High Fall Risk Score >5  Description: Fall Risk Score > 5  Goal: Patient will remain free of falls  Outcome: Progressing  Flowsheets (Taken 02/15/2020 1930)  VH High Risk (Greater than 13):   ALL REQUIRED LOW INTERVENTIONS   RED "HIGH FALL RISK" SIGNAGE   BED ALARM WILL BE ACTIVATED WHEN THE PATEINT IS IN BED WITH SIGNAGE "RESET BED ALARM"   A CHAIR PAD ALARM WILL BE USED WHEN PATIENT IS UP SITTING IN A CHAIR   ALL REQUIRED MODERATE INTERVENTIONS   PATIENT IS TO BE SUPERVISED FOR ALL TOILETING ACTIVITIES   Use of "STOP ask for help" sign     Problem: Altered GI Function  Goal: Fluid and electrolyte balance are achieved/maintained  Description: Interventions:  1. Monitor intake and output every shift  2. Monitor/assess lab values and report abnormal values  3. Provide adequate hydration  4. Assess for confusion/personality changes  5. Monitor daily weight  6. Assess and reassess fluid and electrolyte status  7. Observe for seizure activity and initiate seizure precautions if indicated  8. Observe for cardiac arrhythmias  9. Monitor for muscle weakness  Outcome: Progressing  Flowsheets (Taken 02/13/2020 2222 by Horace Porteous, RN)  Fluid and electrolyte balance are  achieved/maintained:   Monitor intake and output every shift   Monitor/assess lab values and report abnormal values   Provide adequate hydration   Assess for confusion/personality changes   Monitor daily weight   Assess and reassess fluid and electrolyte status   Observe for seizure activity and initiate seizure precautions if indicated   Observe for cardiac arrhythmias  Goal: Elimination patterns are normal or improving  Description: Interventions:  1.      Report abnormal assessment to physician  2.      Anticipate/assist with toileting needs  3.      Assess for normal bowel sounds  4.      Monitor for abdominal distension  5.      Monitor for abdominal discomfort  6.      Assess for signs and symptoms of bleeding.  Report signs of bleeding to physician   7.      Administer treatments as ordered  8.      Consult/collaborate with Clinical  Nutritionist  9.      Assess for flatus  10.   Assess for and discuss C. diff screening with LIP  11.   Collaborate with LIP for containment device  12.   Reinforce education on foods that improve and complicate bowel movements and how activity and medications can affect bowel movements  13.   Administer medications to improve bowel evacuation as prescribed  14.   Encourage/perform oral hygiene as appropriate  Outcome: Progressing  Flowsheets (Taken 02/14/2020 2210 by Chesley Mires, RN)  Elimination patterns are normal or improving:   Report abnormal assessment to physician   Anticipate/assist with toileting needs   Assess for normal bowel sounds   Monitor for abdominal distension   Monitor for abdominal discomfort  Goal: Nutritional intake is adequate  Description: Interventions:  1. Monitor daily weights  2. Assist patient with meals/food selection  3. Allow adequate time for meals  4. Encourage/perform oral hygiene as appropriate   5. Encourage/administer dietary supplements as ordered (i.e. tube feed, TPN, oral, OGT/NGT, supplements)  6. Consult/collaborate  with Clinical Nutritionist  7. Include patient/patient care companion in decisions related to nutrition  8. Assess anorexia, appetite, and amount of meal/food tolerated  9. Consult/collaborate with Speech Therapy (swallow evaluations)  Outcome: Progressing  Flowsheets (Taken 02/14/2020 2210 by Chesley Mires, RN)  Nutritional intake is adequate:   Assist patient with meals/food selection   Allow adequate time for meals  Goal: Mobility/Activity is maintained at optimal level for patient  Description: Interventions:  1. Increase mobility as tolerated/progressive mobility  2. Encourage independent activity per ability  3. Maintain proper body alignment  4. Perform active/passive ROM  5. Plan activities to conserve energy, plan rest periods  6. Reposition patient every 2 hours and as needed unless able to reposition self  7. Assess for changes in respiratory status, level of consciousness and/or development of fatigue  8. Consult/collaborate with Physical Therapy and/or Occupational Therapy  Outcome: Progressing  Flowsheets (Taken 02/14/2020 2210 by Chesley Mires, RN)  Mobility/activity is maintained at optimal level for patient:   Increase mobility as tolerated/progressive mobility   Encourage independent activity per ability   Plan activities to conserve energy, plan rest periods   Consult/collaborate with Physical Therapy and/or Occupational Therapy  Goal: No bleeding  Description: Interventions:  1. Monitor and assess vitals and hemodynamic parameters  2. Monitor/assess lab values and report abnormal values  3. Assess for bruising/petechia  Outcome: Progressing  Flowsheets (Taken 02/13/2020 1058 by Magda Bernheim, Davonna Belling, RN)  No bleeding:   Monitor and assess vitals and hemodynamic parameters   Monitor/assess lab values and report abnormal values   Assess for bruising/petechia     Problem: Compromised Tissue integrity  Goal: Damaged tissue is healing and protected  Description:  Interventions:  1. Monitor/assess Braden scale every shift  2. Provide wound care per wound care algorithm  3. Reposition patient every 2 hours and as needed unless able to reposition self  4. Increase activity as tolerated/progressive mobility  5. Relieve pressure to bony prominences for patients at moderate and high risk  6. Avoid shearing injuries   7. Keep intact skin clean and dry  8. Use bath wipes, not soap and water, for daily bathing   9. Use incontinence wipes for cleaning urine, stool and caustic drainage; Foley care as needed   10. Monitor external devices/tubes for correct placement to prevent pressure, friction and shearing   11. Encourage use of lotion/moisturizer on skin  12.  Monitor patient's hygiene practices  13. Consult/collaborate with wound care nurse   14. Utilize specialty bed  15. Consider placing an indwelling catheter if incontinence interferes with healing of stage 3 or 4 pressure injury  Outcome: Progressing  Flowsheets (Taken 02/13/2020 2222 by Horace Porteous, RN)  Damaged tissue is healing and protected:   Monitor/assess Braden scale every shift   Provide wound care per wound care algorithm   Reposition patient every 2 hours and as needed unless able to reposition self   Increase activity as tolerated/progressive mobility   Relieve pressure to bony prominences for patients at moderate and high risk   Avoid shearing injuries   Keep intact skin clean and dry   Use bath wipes, not soap and water, for daily bathing   Use incontinence wipes for cleaning urine, stool and caustic drainage. Foley care as needed   Monitor patient's hygiene practices  Goal: Nutritional status is improving  Description: Interventions:  1. Assist patient with eating   2. Allow adequate time for meals   3. Encourage patient to take dietary supplement(s) as ordered   4. Collaborate with Clinical Nutritionist  5. Include patient/patient care companion in decisions related to nutrition  Outcome:  Progressing  Flowsheets (Taken 02/15/2020 1124 by Francoise Ceo, RN)  Nutritional status is improving:   Assist patient with eating   Allow adequate time for meals   Include patient/patient care companion in decisions related to nutrition

## 2020-02-16 NOTE — Progress Note - Problem Oriented Charting Notewrit (Signed)
Cross cover note:    Called by nursing staff, 9 beat run of V. tach noted. Patient was ambulating at the time, was not symptomatic.    Review of labs demonstrates low magnesium and low potassium.    Plan: Replete potassium and magnesium, recheck in a.m.

## 2020-02-16 NOTE — Progress Notes (Signed)
Rounded on pt with MD and primary nurse  Per nursing, pt vomiting with full liquids.  NPO, on RA, 90%.     Acute small bowel obstruction due to adhesions status post lysis and abdominal exploration-postop day 3.  Vascular congestion, IV lasix.    Later, spoke with pt and pt firmly declines need for FWW for home use and declines need for [facility] services at d/c. Per pt, she hopes to be d/c by sun/mon, if tolerating diet.  Pt denies any d/c needs       02/16/20 1720   Discharge Disposition   Physical Discharge Disposition Home   Mode of Transportation Car  (roommate Mellody Dance)   Patient/Family/POA notified of transfer plan Yes   Patient agreeable to discharge plan/expected d/c date? Yes  (per pt, ? d/c sun or mon)   CM Interventions   Follow up appointment scheduled? Yes   Is this a Medicare focused or COVID patient? No   Is this appointment within 48-72 hours? No   Referral made for home health RN visit? Patient declined  (pt firmly declines need for [facility] services at d/c)   Multidisciplinary rounds/family meeting before d/c? Yes   Medicare Checklist   Is this a Medicare patient? No   Patient received 1st IMM Letter? n/a          02/16/20 1721   CM Review   CM Comments 11/26-hm at d/c.  At this time, pt firmly declines need for [facility] service at d/c.  Firmly declines need for FWW at d/c

## 2020-02-16 NOTE — Plan of Care (Addendum)
NURSE NOTE SUMMARY  [facility] - MEDICAL SURGICAL UNIT   Patient Name: Tracy Bullock, Tracy Bullock   Attending Physician: Alwyn Ren, MD   Today's date:   02/16/2020 LOS: 4 days   Shift Summary:                                                                2050: patient had 9 beats of VTACH. Patient was up walking the hall with Tech. Patient assessed by this nurse. Patient denies any SOB, Chest pain, dizziness. Tele-monitor in place. MD notified.  2100: New orders for labs BMP and mag, and 40 meq PO potassium and IV magnesium 1g. Will administer and continue to monitor.   5784 Patient complain of hip pain 10/10. Refused tylenol and requested "stronger pai n meds" MD notified. New order for one time po [place]. Will continue to monitor.  0530 patient asleep with eyes close.    Provider Notifications:        Rapid Response Notifications:  Mobility:      PMP Activity: Step 3 - Bed Mobility (02/16/2020  8:20 PM)     Weight tracking:  Family Dynamic:   Last 3 Weights for the past 72 hrs (Last 3 readings):   Weight   02/14/20 0340 85.1 kg (187 lb 9.8 oz)             Last Bowel Movement   No data recorded        Problem: Moderate/High Fall Risk Score >5  Description: Fall Risk Score > 5  Goal: Patient will remain free of falls  Outcome: Progressing  Flowsheets (Taken 02/16/2020 2020)  VH High Risk (Greater than 13):   ALL REQUIRED LOW INTERVENTIONS   ALL REQUIRED MODERATE INTERVENTIONS   RED "HIGH FALL RISK" SIGNAGE   BED ALARM WILL BE ACTIVATED WHEN THE PATEINT IS IN BED WITH SIGNAGE "RESET BED ALARM"   A CHAIR PAD ALARM WILL BE USED WHEN PATIENT IS UP SITTING IN A CHAIR   PATIENT IS TO BE SUPERVISED FOR ALL TOILETING ACTIVITIES   Use of "STOP ask for help" sign   Keep door open for better visibility     Problem: Altered GI Function  Goal: Fluid and electrolyte balance are achieved/maintained  Description: Interventions:  1. Monitor intake and output every shift  2. Monitor/assess lab values and  report abnormal values  3. Provide adequate hydration  4. Assess for confusion/personality changes  5. Monitor daily weight  6. Assess and reassess fluid and electrolyte status  7. Observe for seizure activity and initiate seizure precautions if indicated  8. Observe for cardiac arrhythmias  9. Monitor for muscle weakness  Outcome: Progressing  Flowsheets (Taken 02/16/2020 1622 by Ronald Lobo, RN)  Fluid and electrolyte balance are achieved/maintained:   Monitor intake and output every shift   Monitor/assess lab values and report abnormal values   Provide adequate hydration   Assess for confusion/personality changes   Observe for cardiac arrhythmias   Observe for seizure activity and initiate seizure precautions if indicated   Assess and reassess fluid and electrolyte status  Goal: Elimination patterns are normal or improving  Description: Interventions:  1.      Report abnormal assessment to physician  2.      Anticipate/assist  with toileting needs  3.      Assess for normal bowel sounds  4.      Monitor for abdominal distension  5.      Monitor for abdominal discomfort  6.      Assess for signs and symptoms of bleeding.  Report signs of bleeding to physician   7.      Administer treatments as ordered  8.      Consult/collaborate with Clinical Nutritionist  9.      Assess for flatus  10.   Assess for and discuss C. diff screening with LIP  11.   Collaborate with LIP for containment device  12.   Reinforce education on foods that improve and complicate bowel movements and how activity and medications can affect bowel movements  13.   Administer medications to improve bowel evacuation as prescribed  14.   Encourage/perform oral hygiene as appropriate  Outcome: Progressing  Flowsheets (Taken 02/14/2020 2210 by Chesley Mires, RN)  Elimination patterns are normal or improving:   Report abnormal assessment to physician   Anticipate/assist with toileting needs   Assess for normal bowel sounds    Monitor for abdominal distension   Monitor for abdominal discomfort  Goal: Nutritional intake is adequate  Description: Interventions:  1. Monitor daily weights  2. Assist patient with meals/food selection  3. Allow adequate time for meals  4. Encourage/perform oral hygiene as appropriate   5. Encourage/administer dietary supplements as ordered (i.e. tube feed, TPN, oral, OGT/NGT, supplements)  6. Consult/collaborate with Clinical Nutritionist  7. Include patient/patient care companion in decisions related to nutrition  8. Assess anorexia, appetite, and amount of meal/food tolerated  9. Consult/collaborate with Speech Therapy (swallow evaluations)  Outcome: Progressing  Flowsheets (Taken 02/16/2020 1622 by Ronald Lobo, RN)  Nutritional intake is adequate:   Encourage/perform oral hygiene as appropriate   Assess anorexia, appetite, and amount of meal/food tolerated  Goal: Mobility/Activity is maintained at optimal level for patient  Description: Interventions:  1. Increase mobility as tolerated/progressive mobility  2. Encourage independent activity per ability  3. Maintain proper body alignment  4. Perform active/passive ROM  5. Plan activities to conserve energy, plan rest periods  6. Reposition patient every 2 hours and as needed unless able to reposition self  7. Assess for changes in respiratory status, level of consciousness and/or development of fatigue  8. Consult/collaborate with Physical Therapy and/or Occupational Therapy  Outcome: Progressing  Flowsheets (Taken 02/14/2020 2210 by Chesley Mires, RN)  Mobility/activity is maintained at optimal level for patient:   Increase mobility as tolerated/progressive mobility   Encourage independent activity per ability   Plan activities to conserve energy, plan rest periods   Consult/collaborate with Physical Therapy and/or Occupational Therapy  Goal: No bleeding  Description: Interventions:  1. Monitor and assess vitals and hemodynamic  parameters  2. Monitor/assess lab values and report abnormal values  3. Assess for bruising/petechia  Outcome: Progressing  Flowsheets (Taken 02/13/2020 1058 by Magda Bernheim, Davonna Belling, RN)  No bleeding:   Monitor and assess vitals and hemodynamic parameters   Monitor/assess lab values and report abnormal values   Assess for bruising/petechia     Problem: Compromised Tissue integrity  Goal: Damaged tissue is healing and protected  Description: Interventions:  1. Monitor/assess Braden scale every shift  2. Provide wound care per wound care algorithm  3. Reposition patient every 2 hours and as needed unless able to reposition self  4. Increase activity as tolerated/progressive mobility  5. Relieve pressure to bony prominences for patients at moderate and high risk  6. Avoid shearing injuries   7. Keep intact skin clean and dry  8. Use bath wipes, not soap and water, for daily bathing   9. Use incontinence wipes for cleaning urine, stool and caustic drainage; Foley care as needed   10. Monitor external devices/tubes for correct placement to prevent pressure, friction and shearing   11. Encourage use of lotion/moisturizer on skin  12. Monitor patient's hygiene practices  13. Consult/collaborate with wound care nurse   14. Utilize specialty bed  15. Consider placing an indwelling catheter if incontinence interferes with healing of stage 3 or 4 pressure injury  Outcome: Progressing  Flowsheets (Taken 02/13/2020 2222 by Horace Porteous, RN)  Damaged tissue is healing and protected:   Monitor/assess Braden scale every shift   Provide wound care per wound care algorithm   Reposition patient every 2 hours and as needed unless able to reposition self   Increase activity as tolerated/progressive mobility   Relieve pressure to bony prominences for patients at moderate and high risk   Avoid shearing injuries   Keep intact skin clean and dry   Use bath wipes, not soap and water, for daily bathing   Use incontinence  wipes for cleaning urine, stool and caustic drainage. Foley care as needed   Monitor patient's hygiene practices  Goal: Nutritional status is improving  Description: Interventions:  1. Assist patient with eating   2. Allow adequate time for meals   3. Encourage patient to take dietary supplement(s) as ordered   4. Collaborate with Clinical Nutritionist  5. Include patient/patient care companion in decisions related to nutrition  Outcome: Progressing  Flowsheets (Taken 02/16/2020 1622 by Ronald Lobo, RN)  Nutritional status is improving: Include patient/patient care companion in decisions related to nutrition

## 2020-02-16 NOTE — Progress Notes (Signed)
Medicine Progress Note   [facility]  [facility]   Patient Name: Tracy Bullock, Tracy Bullock LOS: 4 days   Attending Physician: Alwyn Ren, MD PCP: Zollie Beckers, NP      Hospital Course:                                                            Tracy Bullock a 54 y.o.femalepatient who presented to ED with increasing abdominal pain associated with nausea vomiting for 3 days. Patient has multiple prior abdominal surgeries. Nonbloody emesis and no diarrhea. Patient denied any fever, chill, chest pain, shortness of breath or sick contact. In the ED, there was evidence of small of obstruction and adhesion. General surgeon was consulted who took the patient to the OR for lysis of adhesion and exploration. Hospitalist was called for medical management.    Patient was placed briefly on insulin drip for hyperglycemia in the OR and insulin drip was stopped in PACU. Patient is still n.p.o. as per general surgery recommendation except meds with small sips of water. Since patient is n.p.o. Lantus was reduced to 50% of dosage compared to her home dosing in addition to sliding scale. Rapid response was called today in the morning for a brief episode of presyncope. Possibly orthostatic,patient continued on IV fluids, orthostats every morning. Patient has multiple psychiatric problems including PTSD and schizoaffective disorder. Her presyncope with some confusion is possibly also contributed by her anxiety. Patient was hemodynamically stable during the rapid response. Blood sugar was within normal limits. When patient was talked to calmly she began responding appropriately and interactive again     Assessment and Plan:      Acute small bowel obstruction due to adhesions status post lysis and abdominal exploration-postop day 4  - Diet per surgery  Further management per general surgeon.    Acute worsening nausea/vomiting after diet.  Likely due to above.  Defer management to  general surgeon.  NG tube to suction?  However abdominal exam benign.    Acute hypoxic respiratory failure.  Status post IV Lasix.  Resolved.  We will try another IV Lasix 20 mg x 1.  Strict in and out.  Incentive spirometry    Hyperglycemia in the setting of insulin-dependent type 2 diabetes. Stable  Briefly on insulin dripin the ORbut now off the insulin drip.   As per chart review from care everywhere, patient is taking NovoLog 70-30 65 units twice daily.  -Since patient is started on a clear liquid diet today we will continue with Lantus 40 units.  Further adjustment accordingly    Presyncopal episode-possibly orthostatic, ??Anxiety,unlikely seizures given that patient was communicating during the episode.  No more episode.  -Supportive care    Acute urinary retention likely due to effect of anesthesia  -Continue Foley catheter care.  We will consider removing tomorrow.    Hypokalemia and hypomagnesemia.  Likely expected effect of Lasix.  Replete potassium.  Replete magnesium    Seizure.  History of diffuse large B cell lymphoma diagnosed in 2008  Hypertension.  PTSD  History of schizoaffective disorder  History of status migrainosus  Lipitor, Metformin, risperidone, Topamax, and trazodone.  Holding lisinopril with Lasix    Disposition: Inpatient.  Pending further improvement  DVT PPX:  Lovenox  Code:  Full Code  Subjective   Patient had nausea and another episode of vomiting overnight.  Minimal abdominal pain.  Patient still passing flatus but no bowel movement.  Denies any fever, chill, chest pain.  No shortness of breath.      Objective   Physical Exam:       Vitals: T:99.3 F (37.4 C) (Temporal), BP:104/56, HR:(!) 58, RR:20, SaO2:90%    General: Patient is awake.  Minimal distress due to nausea and vomiting  HEENT: No conjunctival drainage, vision is intact, anicteric sclera.  Neck: Supple, no thyromegaly.  Chest: Right sided crackle much improved today  CVS: Normal rate and regular  rhythm no murmurs, without JVD, no pitting edema, pulses palpable.  Abdomen: Soft, mild tenderness, no guarding or rigidity, hypoactive bowel sounds.  Dressing over abdominal surgical site.  No purulent discharge.  Extremities: No pitting edema and no gross deformity.  Skin: Warm, dry, no rash and no worrisome lesions.  NEURO: No motor or sensory deficits.  Psychiatric: Alert, interactive, appropriate, normal affect.  Weight Monitoring 02/12/2020 02/14/2020   Height 157.5 cm -   Height Method Stated -   Weight 72.576 kg 85.1 kg   Weight Method Stated Bed Scale   BMI (calculated) 29.3 kg/m2 -         Intake/Output Summary (Last 24 hours) at 02/16/2020 1008  Last data filed at 02/16/2020 0522  Gross per 24 hour   Intake 970 ml   Output 3050 ml   Net -2080 ml     Body mass index is 34.31 kg/m.     Meds:     Current Facility-Administered Medications   Medication Dose Route Frequency    enoxaparin  40 mg Subcutaneous Q24H    insulin glargine  40 Units Subcutaneous QHS    insulin lispro (1 Unit Dial)  2-24 Units Subcutaneous TID AC & HS    pantoprazole  40 mg Intravenous Q24H    potassium chloride  10 mEq Intravenous Q1H    risperiDONE  1 mg Oral Daily    sodium chloride (PF)  3 mL Intravenous Q12H SCH    topiramate  50 mg Oral Daily    traZODone  200 mg Oral QHS       PRN Meds: acetaminophen, dextrose, diphenhydrAMINE, glucagon (rDNA), naloxone, naloxone, ondansetron, sodium chloride.     LABS:     Estimated Creatinine Clearance: 91.9 mL/min (based on SCr of 0.7 mg/dL).  Recent Labs   Lab 02/15/20  0619 02/14/20  0619   WBC 9.0 8.7   RBC 4.08 4.01   Hemoglobin 12.6 12.4   Hematocrit 38.8 38.6   MCV 95 96   PLT CT 190 154             Lab Results   Component Value Date    HGBA1CPERCNT >14.0 02/12/2020     Recent Labs   Lab 02/16/20  0506 02/15/20  0619 02/14/20  0618 02/14/20  0618   Glucose 171* 153*  More results in Results Review 86   Sodium 143 138  More results in Results Review 137   Potassium 3.2* 3.7   More results in Results Review 3.4*   Chloride 108 107  More results in Results Review 110   CO2 19.2* 18.2*  More results in Results Review 18.6*   BUN 13 12  More results in Results Review 9   Creatinine 0.70 0.64  More results in Results Review 0.57*   EGFR 98 101  --  105   Calcium  9.3 9.0  More results in Results Review 8.3*   More results in Results Review = values in this interval not displayed.     Recent Labs   Lab 02/16/20  0506 02/15/20  0619 02/14/20  0619 02/14/20  0618 02/13/20  0956 02/12/20  1453   Magnesium 1.5* 1.8  --   More results in Results Review  More results in Results Review  --    Phosphorus  --  2.6 1.9*  --   --   --    Albumin 2.3*  --   --   --   --  3.2*   Protein, Total 5.5*  --   --   --   --  6.2   Bilirubin, Total 0.6  --   --   --   --  1.6*   Alkaline Phosphatase 132  --   --   --   --  130   ALT 10  --   --   --   --  33   AST (SGOT) 17  --   --   --   --  167*   More results in Results Review = values in this interval not displayed.     Recent Labs   Lab 02/12/20  0745   Urine Specific Gravity 1.020   pH, Urine 5.5   Protein, UR Negative   Glucose, UA >=1000*   Ketones UA 40*   Bilirubin, UA Negative   Blood, UA Negative   Nitrite, UA Negative   Urobilinogen, UA 0.2   Leukocyte Esterase, UA Negative      Patient Lines/Drains/Airways Status     Active PICC Line / CVC Line / PIV Line / Drain / Airway / Intraosseous Line / Epidural Line / ART Line / Line / Wound / Pressure Ulcer / NG/OG Tube     Name Placement date Placement time Site Days    Peripheral IV 02/12/20 20 G Left Antecubital 02/12/20  0630  Antecubital  3    Urethral Catheter 10 Fr. 02/14/20  1100    1    Wound 02/12/20 Surgical Incision Abdomen Other (Comment) 02/12/20  1546  Abdomen  2               CT Abdomen Pelvis with IV Cont    Result Date: 02/12/2020  Distal small bowel obstruction with transition point in the upper right anterior pelvis, presumably secondary to postsurgical adhesions. Mild small bowel  dilatation measuring up to 3.6 cm. Small volume of free fluid in the pelvis. ReadingStation:[id number]    XR Chest AP Portable    Result Date: 02/15/2020  There is faint right perihilar airspace disease which could represent developing infection or mild asymmetric edema. ReadingStation:[id number]     Home Health Needs:  There are no questions and answers to display.       Nutrition assessment done in collaboration with Registered Dietitians:     Time spent:      Chinita Greenland, MD     02/16/20,10:08 AM   MRN: [id number]                                      CSN: [id number] DOB: December 30, 1965

## 2020-02-17 LAB — RENAL FUNCTION PANEL
Albumin: 2.2 gm/dL — ABNORMAL LOW (ref 3.5–5.0)
Anion Gap: 13.2 mMol/L (ref 7.0–18.0)
BUN / Creatinine Ratio: 27.4 Ratio (ref 10.0–30.0)
BUN: 17 mg/dL (ref 7–22)
CO2: 23.4 mMol/L (ref 20.0–30.0)
Calcium: 8.7 mg/dL (ref 8.5–10.5)
Chloride: 106 mMol/L (ref 98–110)
Creatinine: 0.62 mg/dL (ref 0.60–1.20)
EGFR: 102 mL/min/{1.73_m2} (ref 60–150)
Glucose: 113 mg/dL — ABNORMAL HIGH (ref 71–99)
Osmolality Calculated: 280 mOsm/kg (ref 275–300)
Phosphorus: 2.6 mg/dL (ref 2.3–4.7)
Potassium: 3.6 mMol/L (ref 3.5–5.3)
Sodium: 139 mMol/L (ref 136–147)

## 2020-02-17 LAB — MAGNESIUM: Magnesium: 1.7 mg/dL (ref 1.6–2.6)

## 2020-02-17 LAB — VH DEXTROSE STICK GLUCOSE: Glucose POCT: 147 mg/dL — ABNORMAL HIGH (ref 71–99)

## 2020-02-17 MED ORDER — DEXTROMETHORPHAN-GUAIFENESIN 10-100 MG/5ML PO SYRP
10.0000 mL | ORAL_SOLUTION | ORAL | Status: DC | PRN
Start: 2020-02-17 — End: 2020-02-17
  Administered 2020-02-17: 01:00:00 10 mL via ORAL
  Filled 2020-02-17: qty 10

## 2020-02-17 MED ORDER — HYDROCODONE-ACETAMINOPHEN 5-325 MG PO TABS
1.0000 | ORAL_TABLET | Freq: Once | ORAL | Status: AC
Start: 2020-02-17 — End: 2020-02-17
  Administered 2020-02-17: 06:00:00 1 via ORAL
  Filled 2020-02-17: qty 1

## 2020-02-17 NOTE — Discharge Summary (Signed)
I was notified by the registered nurse, Nicci that patient wanted to leave AMA in the morning as soon as possible.  I went to see the patient.  Patient stated that overnight, patient did not get proper attention from the staff and patient was waiting more than 40-minute for medication and to help.  I apologized.  Patient appreciated.  I explained to the patient that it is not safe for her to go at this time given the fact that patient had extensive abdominal surgery and she had not still tolerated any regular diet.  I also explained that she vomited 3 nights in a row and had 100 cc of nonbloody emesis overnight although it is getting better.  She stated that she was tolerating water and applesauce without any trouble and wanted to leave no matter what.  I also explained that patient required IV Lasix for volume overload and has Foley catheter for urinary retention after the surgery.  I also explained that leaving AMA may resulting infection, worsening and recurrent small bowel obstruction, sepsis, acute kidney injury, and possible death and worst scenario.  Patient voiced understanding and appreciated my explanation however she stated that she made up her mind and will leave AMA.  She has a roommate that can change her dressing.    Patient was also seen by Dr. Thomasenia Bottoms who  consult her as well but she would leave AMA.    She is leaving, knowing risk of AMA

## 2020-02-17 NOTE — Progress Notes (Addendum)
0700: I received report from [place], RN.   0730: Patient upset and wanting to leave against medical advice. She is requesting to speak to the doctor. I did educate her on the risks of leaving and the benefits of staying.   0830: Dr. Audley Hose at the bedside speaking to the patient about leaving AMA. Paper work provided and signed by the patient.  68: Dr. Faylene Million at the bedside. IV site removed. Foley catheter emptied and removed. Patient dressed herself and is up ambulating the halls. Plan of care includes discharging to the Main Entrance (Door 3) at 1000 to catch the 1008 public bus to [employer].   1000: Toni Amend assisted the patient to door with in a wheelchair. All belongings sent with the patient. All questions answered.

## 2020-02-17 NOTE — Progress Notes (Signed)
Hospital Surgery Progress Note    Date Time: 02/17/20 8:45 AM  Patient Name: Tracy Bullock, Tracy Bullock  Date of Birth: [birth year]  Admission Date: 02/12/2020  LOS: 5 Days    Note by: Doylene Canard, MD    Assessment:    POD 5 post LOA, improving.    Recommendation or Plan:       I had a long discussion with her.  Recommended she have a solid diet today and if doing well may be able to discharge later in the day.  She however, is adamant about leaving now and states she knows how to take care of herself.  We discussed that she would be leaving AMA.  Discussed that she should return if not improving, worsening or having any vomiting.  Discussed follow up of her wound with Dr. Laurell Roof next week.    Subjective:        Feels better.  Last vomited at 16:00 yesterday.  Tolerating clear liquids and some applesauce.  Passing flatus but no BM.  No sigificant pain.    Medications:  Current Facility-Administered Medications   Medication Dose Route Frequency    enoxaparin  40 mg Subcutaneous Q24H    insulin glargine  40 Units Subcutaneous QHS    insulin lispro (1 Unit Dial)  2-24 Units Subcutaneous TID AC & HS    pantoprazole  40 mg Intravenous Q24H    risperiDONE  1 mg Oral Daily    sodium chloride (PF)  3 mL Intravenous Q12H SCH    topiramate  50 mg Oral Daily    traZODone  200 mg Oral QHS     acetaminophen, dextromethorphan 10 mg-guaiFENesin 100 mg, dextrose, diphenhydrAMINE, glucagon (rDNA), naloxone, naloxone, ondansetron, sodium chloride    ROS:    No fever or chills  No nausea      Physical Exam:  Vitals:    02/17/20 0647   BP: 123/72   Pulse: 64   Resp: 18   Temp: 97.2 F (36.2 C)   SpO2: 92%       Temperature: Temp  Min: 97.2 F (36.2 C)  Max: 98.4 F (36.9 C)  Pulse: Pulse  Min: 64  Max: 70  Respiratory: Resp  Min: 18  Max: 20  Non-Invasive BP: BP  Min: 112/63  Max: 123/72  Pulse Oximetry SpO2  Min: 92 %  Max: 93 %    Intake and Output Summary (Last 24 hours) at Date Time    Intake/Output Summary (Last 24 hours)  at 02/17/2020 0845  Last data filed at 02/17/2020 0800  Gross per 24 hour   Intake 760 ml   Output 1575 ml   Net -815 ml      Wt Readings from Last 3 Encounters:   02/14/20 85.1 kg (187 lb 9.8 oz)       NAD  Abd; soft, flat, minimal tenderness.  Wound ok.    LABS:  Recent Labs   Lab 02/15/20  0619 02/14/20  0619 02/13/20  0956   WBC 9.0 8.7 11.1*   RBC 4.08 4.01 4.76   Hemoglobin 12.6 12.4 16.1*   Hematocrit 38.8 38.6 46.6   MCV 95 96 98   PLT CT 190 154 188       Recent Labs   Lab 02/17/20  0539 02/16/20  2049 02/16/20  0506 02/15/20  0619 02/14/20  0618   Sodium 139 140 143 138 137   Potassium 3.6 3.5 3.2* 3.7 3.4*   Chloride 106 105 108 107  110   CO2 23.4 19.8* 19.2* 18.2* 18.6*   BUN 17 16 13 12 9    Creatinine 0.62 0.67 0.70 0.64 0.57*   Glucose 113* 148* 171* 153* 86   Calcium 8.7 9.0 9.3 9.0 8.3*   Magnesium 1.7 1.6 1.5* 1.8 1.5*       Recent Labs   Lab 02/17/20  0539 02/16/20  0506 02/12/20  1453 02/12/20  0634 02/12/20  0634   ALT  --  10 33  --  11   AST (SGOT)  --  17 167*  --  13   Bilirubin, Total  --  0.6 1.6*  --  0.8   Albumin 2.2* 2.3* 3.2*  More results in Results Review 4.0   Alkaline Phosphatase  --  132 130  --  140   More results in Results Review = values in this interval not displayed.           Signed by Doylene Canard, MD

## 2020-02-17 NOTE — Progress Note - Problem Oriented Charting Notewrit (Signed)
Cross cover note:    Called by nursing staff.  Patient reporting hip pain.  Refused Tylenol, want something stronger.  Allergic to tramadol.    Plan: Hydrocodone x1 now.  Continue to monitor.

## 2020-02-21 ENCOUNTER — Encounter (RURAL_HEALTH_CENTER): Payer: Self-pay
